# Patient Record
Sex: Female | Born: 1960 | Race: Black or African American | Hispanic: No | Marital: Married | Smoking: Never smoker
Health system: Southern US, Community
[De-identification: ages and names within clinical notes are randomized; demographics above are authoritative.]

## PROBLEM LIST (undated history)

## (undated) DIAGNOSIS — T7840XA Allergy, unspecified, initial encounter: Secondary | ICD-10-CM

## (undated) DIAGNOSIS — K219 Gastro-esophageal reflux disease without esophagitis: Secondary | ICD-10-CM

## (undated) DIAGNOSIS — Z8742 Personal history of other diseases of the female genital tract: Secondary | ICD-10-CM

## (undated) DIAGNOSIS — E7800 Pure hypercholesterolemia, unspecified: Secondary | ICD-10-CM

## (undated) DIAGNOSIS — I10 Essential (primary) hypertension: Secondary | ICD-10-CM

## (undated) DIAGNOSIS — J329 Chronic sinusitis, unspecified: Secondary | ICD-10-CM

## (undated) DIAGNOSIS — E119 Type 2 diabetes mellitus without complications: Secondary | ICD-10-CM

## (undated) DIAGNOSIS — I639 Cerebral infarction, unspecified: Secondary | ICD-10-CM

## (undated) DIAGNOSIS — Z8632 Personal history of gestational diabetes: Secondary | ICD-10-CM

## (undated) DIAGNOSIS — U071 COVID-19: Secondary | ICD-10-CM

## (undated) DIAGNOSIS — E876 Hypokalemia: Secondary | ICD-10-CM

## (undated) DIAGNOSIS — R718 Other abnormality of red blood cells: Secondary | ICD-10-CM

## (undated) HISTORY — DX: Personal history of gestational diabetes: Z86.32

## (undated) HISTORY — DX: Allergy, unspecified, initial encounter: T78.40XA

## (undated) HISTORY — DX: Chronic sinusitis, unspecified: J32.9

## (undated) HISTORY — DX: Other abnormality of red blood cells: R71.8

## (undated) HISTORY — DX: Hypokalemia: E87.6

## (undated) HISTORY — DX: COVID-19: U07.1

## (undated) HISTORY — PX: TUBAL LIGATION: SHX77

## (undated) HISTORY — DX: Personal history of other diseases of the female genital tract: Z87.42

---

## 2001-07-31 HISTORY — PX: ABDOMINAL HYSTERECTOMY: SHX81

## 2011-09-08 ENCOUNTER — Ambulatory Visit: Payer: Self-pay | Admitting: Family Medicine

## 2014-01-23 ENCOUNTER — Ambulatory Visit: Payer: Self-pay | Admitting: Gastroenterology

## 2014-02-22 HISTORY — PX: COLONOSCOPY: SHX174

## 2014-02-22 HISTORY — PX: ESOPHAGOGASTRODUODENOSCOPY: SHX1529

## 2015-01-01 ENCOUNTER — Telehealth: Payer: Self-pay

## 2015-01-01 MED ORDER — AMOXICILLIN-POT CLAVULANATE 875-125 MG PO TABS: 1.0000 | ORAL_TABLET | Freq: Two times a day (BID) | ORAL | Status: DC

## 2015-01-01 NOTE — Telephone Encounter (Signed)
She was seen on Tuesday for a sick visit. She now has face pain from under her eyes down to the jaw and green drainage. She wants to know if you would call something in for her?

## 2015-01-01 NOTE — Telephone Encounter (Signed)
Let pt know strep test was negative It sounds like she is developing a sinus infection perhaps Start new antibiotic (already sent); use neti pot or saline nasal spray to rinse out nasal passages Take probiotic daily for 3 weeks Call or come in if needed

## 2015-01-04 ENCOUNTER — Emergency Department (HOSPITAL_COMMUNITY): Payer: 59

## 2015-01-04 ENCOUNTER — Emergency Department (HOSPITAL_COMMUNITY)
Admission: EM | Admit: 2015-01-04 | Discharge: 2015-01-04 | Disposition: A | Discharge status: Home / Self Care | Payer: 59 | Attending: Emergency Medicine | Admitting: Emergency Medicine

## 2015-01-04 ENCOUNTER — Encounter (HOSPITAL_COMMUNITY): Payer: Self-pay | Admitting: *Deleted

## 2015-01-04 DIAGNOSIS — I10 Essential (primary) hypertension: Secondary | ICD-10-CM | POA: Insufficient documentation

## 2015-01-04 DIAGNOSIS — Z792 Long term (current) use of antibiotics: Secondary | ICD-10-CM | POA: Insufficient documentation

## 2015-01-04 DIAGNOSIS — E119 Type 2 diabetes mellitus without complications: Secondary | ICD-10-CM | POA: Insufficient documentation

## 2015-01-04 DIAGNOSIS — Z7982 Long term (current) use of aspirin: Secondary | ICD-10-CM | POA: Insufficient documentation

## 2015-01-04 DIAGNOSIS — Z79899 Other long term (current) drug therapy: Secondary | ICD-10-CM | POA: Insufficient documentation

## 2015-01-04 DIAGNOSIS — R05 Cough: Secondary | ICD-10-CM | POA: Diagnosis present

## 2015-01-04 DIAGNOSIS — K219 Gastro-esophageal reflux disease without esophagitis: Secondary | ICD-10-CM | POA: Insufficient documentation

## 2015-01-04 DIAGNOSIS — E780 Pure hypercholesterolemia: Secondary | ICD-10-CM | POA: Diagnosis not present

## 2015-01-04 DIAGNOSIS — J159 Unspecified bacterial pneumonia: Secondary | ICD-10-CM | POA: Diagnosis not present

## 2015-01-04 DIAGNOSIS — J189 Pneumonia, unspecified organism: Secondary | ICD-10-CM

## 2015-01-04 HISTORY — DX: Pure hypercholesterolemia, unspecified: E78.00

## 2015-01-04 HISTORY — DX: Gastro-esophageal reflux disease without esophagitis: K21.9

## 2015-01-04 HISTORY — DX: Type 2 diabetes mellitus without complications: E11.9

## 2015-01-04 HISTORY — DX: Essential (primary) hypertension: I10

## 2015-01-04 MED ORDER — IPRATROPIUM-ALBUTEROL 0.5-2.5 (3) MG/3ML IN SOLN
3.0000 mL | Freq: Once | RESPIRATORY_TRACT | Status: AC
  Administered 2015-01-04: 3 mL via RESPIRATORY_TRACT
  Filled 2015-01-04: qty 3

## 2015-01-04 NOTE — ED Notes (Signed)
Pt c/o cough that started two weeks ago that is non productive, has been seen by pcp, was originally placed on inhaler and cough medication, was not getting better, contacted pcp office again Friday and was placed on amoxicillan-clavulanate 875-125 mg bid with no improvement in symptoms, started running fever today,

## 2015-01-04 NOTE — Telephone Encounter (Signed)
Pt. Notified, she picked up the antibiotic on Friday but she stated feeling worse yesterday and she went to the ER last night and was diagnosed with Pneumonia.

## 2015-01-04 NOTE — ED Provider Notes (Signed)
CSN: [id number]     Arrival date & time 01/04/15  0027 History  This chart was scribed for Elizabeth Rhine, MD by Lyndel Safe, ED Scribe. This patient was seen in room APA03/APA03 and the patient's care was started 1:02 AM.  Chief Complaint  Patient presents with  . Cough   Patient is a 54 y.o. female presenting with cough. The history is provided by the patient and the spouse. No language interpreter was used.  Cough Cough characteristics:  Non-productive Severity:  Moderate Onset quality:  Sudden Duration:  2 weeks Timing:  Constant Progression:  Worsening Chronicity:  New Smoker: no   Relieved by:  Nothing Worsened by:  Deep breathing Ineffective treatments:  Beta-agonist inhaler and cough suppressants Associated symptoms: fever, headaches and shortness of breath   Associated symptoms: no chest pain    HPI Comments: Elizabeth Scott is a 54 y.o. female, HTN, GERD, HLD, and DM,  who presents to the Emergency Department complaining of a constant, progressively worsening cough onset 2 weeks ago with associated subjective fever, headache, SOB, and decreased appetite. She reports she was seen by her doctor 5 days ago and was prescribed albuterol and cough medication which provided no relief. Her doctor then called in amoxicillin which she was compliant with but the symptoms have worsened. She denies vomiting, diarrhea, abdominal pain, LE swelling, or a history of smoking.  Past Medical History  Diagnosis Date  . Hypertension   . Diabetes mellitus without complication   . High cholesterol   . GERD (gastroesophageal reflux disease)    Past Surgical History  Procedure Laterality Date  . Abdominal hysterectomy    . Cesarean section     No family history on file. History  Substance Use Topics  . Smoking status: Never Smoker   . Smokeless tobacco: Not on file  . Alcohol Use: No   OB History    No data available     Review of Systems  Constitutional: Positive for fever  and appetite change.  Respiratory: Positive for cough and shortness of breath.   Cardiovascular: Negative for chest pain and leg swelling.  Gastrointestinal: Negative for vomiting, abdominal pain and diarrhea.  Neurological: Positive for headaches.  All other systems reviewed and are negative.  Allergies  Metformin and related  Home Medications   Prior to Admission medications   Medication Sig Start Date End Date Taking? Authorizing Provider  albuterol (PROVENTIL HFA;VENTOLIN HFA) 108 (90 BASE) MCG/ACT inhaler Inhale 2 puffs into the lungs every 2 (two) hours as needed for wheezing or shortness of breath.   Yes Historical Provider, MD  AMLODIPINE BESYLATE PO Take 5 mg by mouth daily.   Yes Historical Provider, MD  aspirin 81 MG tablet Take 81 mg by mouth daily.   Yes Historical Provider, MD  atorvastatin (LIPITOR) 40 MG tablet Take 40 mg by mouth daily.   Yes Historical Provider, MD  glipiZIDE (GLUCOTROL XL) 10 MG 24 hr tablet Take 10 mg by mouth daily with breakfast.   Yes Historical Provider, MD  HYDROCODONE-CHLORPHENIRAMINE PO Take 5 mLs by mouth as needed.   Yes Historical Provider, MD  linagliptin (TRADJENTA) 5 MG TABS tablet Take 5 mg by mouth daily.   Yes Historical Provider, MD  lisinopril-hydrochlorothiazide (PRINZIDE,ZESTORETIC) 20-25 MG per tablet Take 1 tablet by mouth daily.   Yes Historical Provider, MD  pantoprazole (PROTONIX) 40 MG tablet Take 40 mg by mouth daily.   Yes Historical Provider, MD  PRESCRIPTION MEDICATION    Yes Historical  Provider, MD  amoxicillin-clavulanate (AUGMENTIN) 875-125 MG per tablet Take 1 tablet by mouth 2 (two) times daily. 01/01/15   Kerman PasseyMelinda P Lada, MD   BP 144/71 mmHg  Pulse 112  Temp(Src) 98.8 F (37.1 C) (Oral)  Resp 20  Ht 5\' 3"  (1.6 m)  Wt 208 lb (94.348 kg)  BMI 36.85 kg/m2  SpO2 94%  Physical Exam CONSTITUTIONAL: Well developed/well nourished HEAD: Normocephalic/atraumatic EYES: EOMI/PERRL ENMT: Mucous membranes moist NECK:  supple no meningeal signs SPINE/BACK:entire spine nontender CV: S1/S2 noted, no murmurs/rubs/gallops noted LUNGS:decreased breath sounds bilaterally  ABDOMEN: soft, nontender, no rebound or guarding, bowel sounds noted throughout abdomen GU:no cva tenderness NEURO: Pt is awake/alert/appropriate, moves all extremitiesx4.  No facial droop.   EXTREMITIES: pulses normal/equal, full ROM SKIN: warm, color normal PSYCH: no abnormalities of mood noted, alert and oriented to situation  ED Course  Procedures  DIAGNOSTIC STUDIES: Oxygen Saturation is 94% on RA, adequate by my interpretation.    COORDINATION OF CARE: 1:03 AM Discussed treatment plan which includes breathing treatment and Xray with pt. Pt acknowledges and agrees to plan.   Medications  ipratropium-albuterol (DUONEB) 0.5-2.5 (3) MG/3ML nebulizer solution 3 mL (3 mLs Nebulization Given 01/04/15 0101)    Imaging Review Dg Chest 2 View  01/04/2015   CLINICAL DATA:  Nonproductive cough, chest soreness and shortness of breath for 2 weeks. Fever.  EXAM: CHEST  2 VIEW  COMPARISON:  None.  FINDINGS: Minimal left lower lobe consolidation abutting the diaphragm. Right lung is clear. Cardiomediastinal contours are normal. No pulmonary edema, pleural effusion or pneumothorax. No acute osseous abnormalities.  IMPRESSION: Minimal left lower lobe consolidation concerning for pneumonia. Followup PA and lateral chest X-ray is recommended in 3-4 weeks following trial of antibiotic therapy to ensure resolution and exclude underlying malignancy.   Electronically Signed   By: Rubye OaksMelanie  Ehinger M.D.   On: 01/04/2015 02:44   Pneumonia noted Pt is well appearing, no distress and no hypoxia She just started augmentin 2 days ago - recommend continuing for now, but if not improving within 48 hours may need to call PCP to change to zpack Stable for d/c home Advised need for repeat CXR in 4 weeks   MDM   Final diagnoses:  CAP (community acquired pneumonia)     Nursing notes including past medical history and social history reviewed and considered in documentation   Nursing notes including past medical history and social history reviewed and considered in documentation xrays/imaging reviewed by myself and considered during evaluation   I personally performed the services described in this documentation, which was scribed in my presence. The recorded information has been reviewed and is accurate.      Elizabeth Rhineonald Alissia Lory, MD 01/04/15 [phone]

## 2015-01-06 ENCOUNTER — Telehealth: Payer: Self-pay | Admitting: Family Medicine

## 2015-01-06 DIAGNOSIS — J189 Pneumonia, unspecified organism: Secondary | ICD-10-CM

## 2015-01-06 NOTE — Telephone Encounter (Signed)
Pt. Notified.

## 2015-01-06 NOTE — Telephone Encounter (Signed)
She saw you last week for a sick visit. She then went to the ER on Monday and was diagnosed with Pneumonia. They gave her a work note for June 6th, she has still been out. She wants to know if you can write a note for her being out of work on the 7th and the 8th or does she need to get that from the ER?

## 2015-01-06 NOTE — Telephone Encounter (Signed)
Pt called and needs a call back, has a question about a doctors note. Please call pt @ [phone]. Thanks.

## 2015-01-06 NOTE — Telephone Encounter (Signed)
We certainly hope she is feeling better Please have her get a CXR in 3-4 weeks (I entered order, for Elizabeth Scott) Since we've not seen her, I would ask if she could contact the ER for the work note extension Come in for appointment if getting worse or not resolving Thank you

## 2015-01-07 ENCOUNTER — Telehealth: Payer: Self-pay | Admitting: Family Medicine

## 2015-01-07 NOTE — Telephone Encounter (Signed)
Pt has questions about FMLA papers.  Please call her.

## 2015-01-07 NOTE — Telephone Encounter (Signed)
Patient scheduled appt with Dr. Sherie Don tomorrow morning. 01/08/15 for paperwork

## 2015-01-08 ENCOUNTER — Other Ambulatory Visit: Payer: Self-pay

## 2015-01-08 ENCOUNTER — Ambulatory Visit (INDEPENDENT_AMBULATORY_CARE_PROVIDER_SITE_OTHER): Payer: 59 | Admitting: Family Medicine

## 2015-01-08 ENCOUNTER — Encounter: Payer: Self-pay | Admitting: Family Medicine

## 2015-01-08 VITALS — BP 128/83 | HR 90 | Temp 97.7°F | Wt 206.0 lb

## 2015-01-08 DIAGNOSIS — J189 Pneumonia, unspecified organism: Secondary | ICD-10-CM

## 2015-01-08 DIAGNOSIS — K219 Gastro-esophageal reflux disease without esophagitis: Secondary | ICD-10-CM

## 2015-01-08 MED ORDER — LISINOPRIL-HYDROCHLOROTHIAZIDE 20-25 MG PO TABS: 1.0000 | ORAL_TABLET | Freq: Every day | ORAL | Status: DC

## 2015-01-08 NOTE — Patient Instructions (Addendum)
Out of work all this week and Monday of next week Return to work on Tuesday for just 4 hours a day for the rest of that week Return to full time the following Monday Call if your symptoms worsen or we need to change that Try raising the head of the bed Limit the pantoprazole and skip two days a week, and okay to use Tums for symptom control if needed Avoid any triggers for heartburn Try to get some dietary magnesium High levels of magnesium are found in green leafy vegetables, peas, beans, and nuts among other things. It can also be given through medications by mouth. OTC supplement is 250 or 400 mg if you feel palpitations   Gastroesophageal Reflux Disease, Adult Gastroesophageal reflux disease (GERD) happens when acid from your stomach flows up into the esophagus. When acid comes in contact with the esophagus, the acid causes soreness (inflammation) in the esophagus. Over time, GERD may create small holes (ulcers) in the lining of the esophagus. CAUSES   Increased body weight. This puts pressure on the stomach, making acid rise from the stomach into the esophagus.  Smoking. This increases acid production in the stomach.  Drinking alcohol. This causes decreased pressure in the lower esophageal sphincter (valve or ring of muscle between the esophagus and stomach), allowing acid from the stomach into the esophagus.  Late evening meals and a full stomach. This increases pressure and acid production in the stomach.  A malformed lower esophageal sphincter. Sometimes, no cause is found. SYMPTOMS   Burning pain in the lower part of the mid-chest behind the breastbone and in the mid-stomach area. This may occur twice a week or more often.  Trouble swallowing.  Sore throat.  Dry cough.  Asthma-like symptoms including chest tightness, shortness of breath, or wheezing. DIAGNOSIS  Your caregiver may be able to diagnose GERD based on your symptoms. In some cases, X-rays and other tests may  be done to check for complications or to check the condition of your stomach and esophagus. TREATMENT  Your caregiver may recommend over-the-counter or prescription medicines to help decrease acid production. Ask your caregiver before starting or adding any new medicines.  HOME CARE INSTRUCTIONS   Change the factors that you can control. Ask your caregiver for guidance concerning weight loss, quitting smoking, and alcohol consumption.  Avoid foods and drinks that make your symptoms worse, such as:  Caffeine or alcoholic drinks.  Chocolate.  Peppermint or mint flavorings.  Garlic and onions.  Spicy foods.  Citrus fruits, such as oranges, lemons, or limes.  Tomato-based foods such as sauce, chili, salsa, and pizza.  Fried and fatty foods.  Avoid lying down for the 3 hours prior to your bedtime or prior to taking a nap.  Eat small, frequent meals instead of large meals.  Wear loose-fitting clothing. Do not wear anything tight around your waist that causes pressure on your stomach.  Raise the head of your bed 6 to 8 inches with wood blocks to help you sleep. Extra pillows will not help.  Only take over-the-counter or prescription medicines for pain, discomfort, or fever as directed by your caregiver.  Do not take aspirin, ibuprofen, or other nonsteroidal anti-inflammatory drugs (NSAIDs). SEEK IMMEDIATE MEDICAL CARE IF:   You have pain in your arms, neck, jaw, teeth, or back.  Your pain increases or changes in intensity or duration.  You develop nausea, vomiting, or sweating (diaphoresis).  You develop shortness of breath, or you faint.  Your vomit is green,  yellow, black, or looks like coffee grounds or blood.  Your stool is red, bloody, or black. These symptoms could be signs of other problems, such as heart disease, gastric bleeding, or esophageal bleeding. MAKE SURE YOU:   Understand these instructions.  Will watch your condition.  Will get help right away if  you are not doing well or get worse. Document Released: 04/26/2005 Document Revised: 10/09/2011 Document Reviewed: 02/03/2011 [facility] Patient Information 2015 [place], [place]. This information is not intended to replace advice given to you by your health care provider. Make sure you discuss any questions you have with your health care provider.

## 2015-01-08 NOTE — Assessment & Plan Note (Signed)
Explained risk of continued use of PPIs (anemia, pneumonia, osteoporosis, etc.); she will try to skip a few doses each week, avoid triggers, use Tums, etc.; I encouraged her to try to elevated the HOB; no red flags

## 2015-01-08 NOTE — Progress Notes (Signed)
   BP 128/83 mmHg  Pulse 90  Temp(Src) 97.7 F (36.5 C)  Wt 206 lb (93.441 kg)  SpO2 96%   Subjective:    Patient ID: Elizabeth Scott, female    DOB: 1961/04/11, 54 y.o.   MRN: [id number]  HPI: Elizabeth Scott is a 54 y.o. 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 Interim medical history since our last visit reviewed. Allergies and medications reviewed and updated.  Review of Systems  Constitutional: Negative for fever and chills.  HENT: Positive for congestion, postnasal drip, sinus pressure and sneezing.   Respiratory: Positive for cough, shortness of breath and wheezing (not bad, husband might say yes; using SABA which helps).   Cardiovascular: Positive for palpitations.  Gastrointestinal: Negative for abdominal pain.   Per HPI unless specifically indicated above     Objective:    BP 128/83 mmHg  Pulse 90  Temp(Src) 97.7 F (36.5 C)  Wt 206 lb (93.441 kg)  SpO2 96%  Wt Readings from Last 3 Encounters:  01/08/15 206 lb (93.441 kg)  12/29/14 211 lb (95.709 kg)  01/04/15 208 lb (94.348 kg)    Physical Exam  Constitutional: She appears well-developed and well-nourished. No distress.  Eyes: Right eye exhibits no discharge. Left eye exhibits no discharge. No scleral icterus.  Neck: Neck supple.  Cardiovascular: Normal rate, regular rhythm and  normal heart sounds.   Pulmonary/Chest: Effort normal. No respiratory distress. She has no wheezes.  Faint rhonchi in the left lower lobe; occasional cough  Abdominal: Soft. She exhibits no distension.  Skin: Skin is warm and dry. No rash noted. She is not diaphoretic.  Psychiatric: She has a normal mood and affect. Her behavior is normal.    No results found for this or any previous visit.    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

## 2015-01-22 ENCOUNTER — Ambulatory Visit
Admission: RE | Admit: 2015-01-22 | Discharge: 2015-01-22 | Disposition: A | Discharge status: Home / Self Care | Payer: 59 | Source: Ambulatory Visit | Attending: Family Medicine | Admitting: Family Medicine

## 2015-01-22 ENCOUNTER — Telehealth: Payer: Self-pay | Admitting: Family Medicine

## 2015-01-22 DIAGNOSIS — J189 Pneumonia, unspecified organism: Secondary | ICD-10-CM | POA: Diagnosis present

## 2015-01-22 DIAGNOSIS — Z09 Encounter for follow-up examination after completed treatment for conditions other than malignant neoplasm: Secondary | ICD-10-CM | POA: Insufficient documentation

## 2015-01-22 NOTE — Telephone Encounter (Signed)
Pneumonia

## 2015-04-27 ENCOUNTER — Telehealth: Payer: Self-pay | Admitting: Family Medicine

## 2015-04-27 DIAGNOSIS — R718 Other abnormality of red blood cells: Secondary | ICD-10-CM

## 2015-04-27 DIAGNOSIS — I10 Essential (primary) hypertension: Secondary | ICD-10-CM | POA: Insufficient documentation

## 2015-04-27 DIAGNOSIS — E669 Obesity, unspecified: Secondary | ICD-10-CM

## 2015-04-27 DIAGNOSIS — E7800 Pure hypercholesterolemia, unspecified: Secondary | ICD-10-CM

## 2015-04-27 DIAGNOSIS — E785 Hyperlipidemia, unspecified: Secondary | ICD-10-CM | POA: Insufficient documentation

## 2015-04-27 DIAGNOSIS — E119 Type 2 diabetes mellitus without complications: Secondary | ICD-10-CM | POA: Insufficient documentation

## 2015-04-27 DIAGNOSIS — E1165 Type 2 diabetes mellitus with hyperglycemia: Secondary | ICD-10-CM

## 2015-04-27 DIAGNOSIS — IMO0002 Reserved for concepts with insufficient information to code with codable children: Secondary | ICD-10-CM

## 2015-04-27 NOTE — Telephone Encounter (Signed)
Please let Adessa Primiano Durden know that I'd like to see patient for an appointment here in the office for:  diabetes Please schedule a visit with me  in the next: month Fasting?  Yes please We received a note from her insurance company and we'll want to talk about her acid reflux medicine too Thank you, Dr. Sherie Don

## 2015-04-30 NOTE — Telephone Encounter (Signed)
After 2x calling patient answered the phone and she stated that she would have to look at her schedule and see when she can come in. She will give [place] a call back later.

## 2015-06-03 ENCOUNTER — Telehealth: Payer: Self-pay | Admitting: Family Medicine

## 2015-06-03 NOTE — Telephone Encounter (Signed)
Called patients numbers and can not leave vm, will try again later.

## 2015-06-03 NOTE — Telephone Encounter (Signed)
Routing to provider  

## 2015-06-03 NOTE — Telephone Encounter (Signed)
Cr and K+ from Feb 2016 reviewed; she is overdue for visit and labs ------------------ Elizabeth Scott Please let Scherrie Gerlacheresa Elaine Mccutcheon know that I'd like to see patient for an appointment here in the office for:  Diabetes See previous note from September Please schedule a visit with me  in the next: few weeks Fasting?  Yes please Thank you, Dr. Sherie DonLada Sending Rx as requested

## 2015-06-05 ENCOUNTER — Other Ambulatory Visit: Payer: Self-pay | Admitting: Family Medicine

## 2015-07-02 ENCOUNTER — Other Ambulatory Visit: Payer: Self-pay | Admitting: Family Medicine

## 2015-07-02 NOTE — Telephone Encounter (Signed)
Please see notes from Sept and Nov Patient needs an appointment and fasting labs please Please let Scherrie Gerlacheresa Elaine Mendonsa know that I'd like to see patient for an appointment here in the office for:  Diabetes, HTN, cholesterol Please schedule a visit with me  in the next: three weeks Fasting?  Yes THIRTY minutes Thank you, Dr. Sherie DonLada Rx sent

## 2015-07-02 NOTE — Telephone Encounter (Signed)
Called left vm to call us and schedule f/u appt for diabetes, cholesterol with fasting labs.

## 2015-07-02 NOTE — Telephone Encounter (Signed)
Your patient.  Thanks 

## 2015-07-09 ENCOUNTER — Encounter: Payer: Self-pay | Admitting: Family Medicine

## 2015-07-09 NOTE — Telephone Encounter (Signed)
Send letter out 07/09/15

## 2015-07-24 ENCOUNTER — Other Ambulatory Visit: Payer: Self-pay | Admitting: Family Medicine

## 2015-07-24 NOTE — Telephone Encounter (Signed)
See notes from Sept, Nov, and earlier this month; letter sent out; note to pharmacist, we are trying to reach patient to bring her in for an appt; asked them to have patient call office; I allowed 7 days of medicine

## 2015-08-10 ENCOUNTER — Other Ambulatory Visit: Payer: Self-pay | Admitting: Family Medicine

## 2015-08-10 NOTE — Telephone Encounter (Signed)
Routing to provider  

## 2015-08-10 NOTE — Telephone Encounter (Signed)
See notes from Sept and Nov, [facility] to come [facility]

## 2015-08-10 NOTE — Telephone Encounter (Signed)
Pt called in to schedule cpe for 08/30/15 but will need refill on lisinopril sent to [facility].

## 2015-08-11 MED ORDER — LISINOPRIL-HYDROCHLOROTHIAZIDE 20-25 MG PO TABS: 1.0000 | ORAL_TABLET | Freq: Every day | ORAL | Status: DC

## 2015-08-11 NOTE — Telephone Encounter (Signed)
She needs a separate visit please for her diabetes and multiple medical problems; we don't typically address chronic problems at the time of preventive care; she can switch that appt to diabetes, HTN, etc.or make a second appt please; she hasn't had diabetes labs done since Feb 2016

## 2015-08-30 ENCOUNTER — Ambulatory Visit (INDEPENDENT_AMBULATORY_CARE_PROVIDER_SITE_OTHER): Payer: 59 | Admitting: Family Medicine

## 2015-08-30 VITALS — BP 118/78 | HR 76 | Temp 97.8°F | Ht 63.25 in | Wt 210.0 lb

## 2015-08-30 DIAGNOSIS — E669 Obesity, unspecified: Secondary | ICD-10-CM

## 2015-08-30 DIAGNOSIS — I10 Essential (primary) hypertension: Secondary | ICD-10-CM | POA: Diagnosis not present

## 2015-08-30 DIAGNOSIS — Z1239 Encounter for other screening for malignant neoplasm of breast: Secondary | ICD-10-CM

## 2015-08-30 DIAGNOSIS — Z0000 Encounter for general adult medical examination without abnormal findings: Secondary | ICD-10-CM | POA: Diagnosis not present

## 2015-08-30 DIAGNOSIS — E7800 Pure hypercholesterolemia, unspecified: Secondary | ICD-10-CM

## 2015-08-30 DIAGNOSIS — Z91018 Allergy to other foods: Secondary | ICD-10-CM | POA: Diagnosis not present

## 2015-08-30 DIAGNOSIS — R718 Other abnormality of red blood cells: Secondary | ICD-10-CM | POA: Diagnosis not present

## 2015-08-30 DIAGNOSIS — E1165 Type 2 diabetes mellitus with hyperglycemia: Secondary | ICD-10-CM

## 2015-08-30 MED ORDER — LISINOPRIL-HYDROCHLOROTHIAZIDE 20-25 MG PO TABS: 1.0000 | ORAL_TABLET | Freq: Every day | ORAL | Status: DC

## 2015-08-30 MED ORDER — GLIPIZIDE ER 10 MG PO TB24: 10.0000 mg | ORAL_TABLET | Freq: Every morning | ORAL | Status: DC

## 2015-08-30 MED ORDER — EPINEPHRINE 0.3 MG/0.3ML IJ SOAJ
0.3000 mg | Freq: Once | INTRAMUSCULAR | Status: DC
Start: 2015-08-30 — End: 2019-12-30

## 2015-08-30 MED ORDER — AMLODIPINE BESYLATE 5 MG PO TABS: 5.0000 mg | ORAL_TABLET | Freq: Every day | ORAL | Status: DC

## 2015-08-30 MED ORDER — LINAGLIPTIN 5 MG PO TABS: 5.0000 mg | ORAL_TABLET | Freq: Every day | ORAL | Status: DC

## 2015-08-30 MED ORDER — ATORVASTATIN CALCIUM 40 MG PO TABS: 40.0000 mg | ORAL_TABLET | Freq: Every day | ORAL | Status: DC

## 2015-08-30 NOTE — Assessment & Plan Note (Signed)
Encouragement given for weight loss; see AVS 

## 2015-08-30 NOTE — Assessment & Plan Note (Addendum)
Check fasting lipids today; continue statin; limit saturated fats; work on weight loss

## 2015-08-30 NOTE — Assessment & Plan Note (Addendum)
Epi-pen to use for life-threatening emergency only; pay attention to labels; carrying benadryl

## 2015-08-30 NOTE — Assessment & Plan Note (Signed)
USPSTF grade A and B recommendations reviewed with patient; age-appropriate recommendations, preventive care, screening tests, etc discussed and encouraged; healthy living encouraged; see AVS for patient education given to patient  

## 2015-08-30 NOTE — Progress Notes (Signed)
Patient ID: Elizabeth Scott, female   DOB: [birth year], 55 y.o.   MRN: [id number]   Subjective:   Elizabeth Scott is a 55 y.o. female here for a complete physical exam  Interim issues since last visit: none  USPSTF grade A and B recommendations Alcohol: not excess Depression:  Depression screen [facility] 2/9 08/30/2015  Decreased Interest 0  Down, Depressed, Hopeless 0  PHQ - 2 Score 0   Hypertension: controlled Obesity: not trying to lose weight; she will try to lose weight Tobacco use: nonsmoker HIV, hep B, hep C: politely declined STD testing and prevention (chl/gon/syphilis): asx, declined Lipids: check, fasting Glucose: fasting Colorectal cancer: UTD 2015; no fam hx, next due 2020, had 2 polyps Breast cancer: no lumps, no fam hx BRCA gene screening:no ovarian cancer Intimate partner violence:no abuse Cervical cancer screening: s/p hysterectomy, noncancerous reasons Lung cancer: n/a Osteoporosis: n/a Fall prevention/vitamin D: not taking, suggested 1000 iu  AAA: n/a Aspirin: n/a, taking one daily, coated to protect stomach Diet: typical American, no sausage, no eggs, usually skips breakfast Exercise: no regular exercise, walks at work, on feet more than 4 hours a day Skin cancer: no worrisome moles  -----------------  Diabetes; no blurred vision, no dry mouth; last eye exam July or August 2016; taking aspirin No numbness or sore of the feet; cannot take metformin, turned mouth white and couldn't taste anything Flu shot UTD  High cholesterol; not much sausage, not much cheese, no eggs; on statin; occasional muscle aches, nothing bad enough to stop medicine  High blood pressure; controlled today on medicine  Obesity; she and her sister are going to try to lose weight together  GERD, not an issue  Past Medical History  Diagnosis Date  . Hypertension   . Diabetes mellitus without complication ([place])   . High cholesterol   . GERD (gastroesophageal reflux  disease)   . Chronic sinusitis   . History of gestational diabetes   . Allergy   . Hypopotassemia   . Microcytosis   . History of abnormal cervical Pap smear     Prior to Hysterectomy, normal paps for 2-3 years after Hysterectomy   Past Surgical History  Procedure Laterality Date  . Cesarean section    . Tubal ligation    . Abdominal hysterectomy  2003    still has ovaries-for menorrhagia  . Colonoscopy  02/22/14    repeat 5 years  . Esophagogastroduodenoscopy  02/22/14   Family History  Problem Relation Age of Onset  . Diabetes Mother   . Heart disease Mother   . Hypertension Mother   . Stroke Mother   . Diabetes Father   . Hypertension Father   . Asthma Sister   . Diabetes Sister   . Hypertension Sister   . Lung disease Sister   . Hypertension Brother   . Heart disease Brother   . Stroke Brother   . Asthma Sister   . Diabetes Sister   . Hypertension Sister   . Cancer Neg Hx   . COPD Neg Hx    Social History  Substance Use Topics  . Smoking status: Never Smoker   . Smokeless tobacco: Never Used  . Alcohol Use: No   Review of Systems  Constitutional: Negative for diaphoresis and unexpected weight change.  HENT: Negative for nosebleeds.   Eyes: Negative for visual disturbance.  Respiratory: Negative for shortness of breath. Wheezing: just when catching a cold.   Cardiovascular: Negative for chest pain.  Gastrointestinal: Negative for  blood in stool.  Endocrine: Positive for cold intolerance. Negative for polydipsia and polyuria.  Genitourinary: Negative for hematuria.  Musculoskeletal: Negative for joint swelling and arthralgias.  Skin:       No worrisome moles  Allergic/Immunologic: Positive for food allergies (walnuts, no anaphylaxis, tongue itches, uses benadryl).  Neurological: Negative for tremors.  Hematological: Bruises/bleeds easily (easy bruising, nothing new).  Psychiatric/Behavioral: Negative for dysphoric mood.   Objective:   Filed Vitals:    08/30/15 1007  BP: 118/78  Pulse: 76  Temp: 97.8 F (36.6 C)  Height: 5' 3.25" (1.607 m)  Weight: 210 lb (95.255 kg)  SpO2: 97%   Body mass index is 36.89 kg/(m^2). Wt Readings from Last 3 Encounters:  08/30/15 210 lb (95.255 kg)  01/08/15 206 lb (93.441 kg)  12/29/14 211 lb (95.709 kg)   Physical Exam  Constitutional: She appears well-developed and well-nourished.  obese  HENT:  Head: Normocephalic and atraumatic.  Right Ear: Hearing, tympanic membrane, external ear and ear canal normal.  Left Ear: Hearing, tympanic membrane, external ear and ear canal normal.  Eyes: Conjunctivae and EOM are normal. Right eye exhibits no hordeolum. Left eye exhibits no hordeolum. No scleral icterus.  Neck: Carotid bruit is not present. No thyromegaly present.  Cardiovascular: Normal rate, regular rhythm, S1 normal, S2 normal and normal heart sounds.   No extrasystoles are present.  Pulmonary/Chest: Effort normal and breath sounds normal. No respiratory distress. Right breast exhibits no inverted nipple, no mass, no nipple discharge, no skin change and no tenderness. Left breast exhibits no inverted nipple, no mass, no nipple discharge, no skin change and no tenderness. Breasts are symmetrical.  Abdominal: Soft. Normal appearance and bowel sounds are normal. She exhibits no distension, no abdominal bruit, no pulsatile midline mass and no mass. There is no hepatosplenomegaly. There is no tenderness. No hernia.  Musculoskeletal: Normal range of motion. She exhibits no edema.  Lymphadenopathy:       Head (right side): No submandibular adenopathy present.       Head (left side): No submandibular adenopathy present.    She has no cervical adenopathy.    She has no axillary adenopathy.  Neurological: She is alert. She displays no tremor. No cranial nerve deficit. She exhibits normal muscle tone. Gait normal.  Reflex Scores:      Patellar reflexes are 2+ on the right side and 2+ on the left side. Skin:  Skin is warm and dry. No bruising and no ecchymosis noted. No cyanosis. No pallor.  Psychiatric: Her speech is normal and behavior is normal. Thought content normal. Her mood appears not anxious. She does not exhibit a depressed mood.   Diabetic Foot Form - Detailed   Diabetic Foot Exam - detailed  Diabetic Foot exam was performed with the following findings:  Yes 08/30/2015 10:57 AM  Visual Foot Exam completed.:  Yes  Are the shoes appropriate in style and fit?:  Yes  Are the toenails long?:  No  Are the toenails thick?:  No  Is there a claw toe deformity?:  No  Is there limited skin dorsiflexion?:  No  Are the toenails ingrown?:  No  Normal Range of Motion:  Yes    Pulse Foot Exam completed.:  Yes  Right Dorsalis Pedis:  Present Left Dorsalis Pedis:  Present  Sensory Foot Exam Completed.:  Yes  Swelling:  No  Semmes-Weinstein Monofilament Test  R Site 1-Great Toe:  Pos L Site 1-Great Toe:  Pos  R Site 4:  Pos L Site 4:  Pos  R Site 5:  Pos L Site 5:  Pos        Assessment/Plan:   Problem List Items Addressed This Visit      Cardiovascular and Mediastinum   Essential hypertension, benign    Well-controlled today; DASH guidelines encoruaged, see AVS; continue meds; work on weight loss      Relevant Medications   EPINEPHrine (EPIPEN 2-PAK) 0.3 mg/0.3 mL IJ SOAJ injection   lisinopril-hydrochlorothiazide (PRINZIDE,ZESTORETIC) 20-25 MG tablet   atorvastatin (LIPITOR) 40 MG tablet   amLODipine (NORVASC) 5 MG tablet     Endocrine   Type 2 diabetes mellitus, uncontrolled (HCC)    Check A1c and urine microalbumin today; foot exam by MD today; instructed pt to check fee daily; work on weight loss; continue meds      Relevant Medications   lisinopril-hydrochlorothiazide (PRINZIDE,ZESTORETIC) 20-25 MG tablet   linagliptin (TRADJENTA) 5 MG TABS tablet   glipiZIDE (GLUCOTROL XL) 10 MG 24 hr tablet   atorvastatin (LIPITOR) 40 MG tablet   Other Relevant Orders   Hgb A1c w/o eAG    Microalbumin / creatinine urine ratio     Other   Hypercholesterolemia    Check fasting lipids today; continue statin; limit saturated fats; work on weight loss      Relevant Medications   EPINEPHrine (EPIPEN 2-PAK) 0.3 mg/0.3 mL IJ SOAJ injection   lisinopril-hydrochlorothiazide (PRINZIDE,ZESTORETIC) 20-25 MG tablet   atorvastatin (LIPITOR) 40 MG tablet   amLODipine (NORVASC) 5 MG tablet   Other Relevant Orders   Lipid Panel w/o Chol/HDL Ratio   Microcytosis    Check CBC and ferritin; 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; will check hemoglobin electrophoresis      Relevant Orders   CBC with Differential/Platelet   Ferritin   Hemoglobinopathy evaluation   Obesity    Encouragement given for weight loss; see AVS      Relevant Medications   linagliptin (TRADJENTA) 5 MG TABS tablet   glipiZIDE (GLUCOTROL XL) 10 MG 24 hr tablet   Walnut allergy    Epi-pen to use for life-threatening emergency only; pay attention to labels; carrying benadryl      Preventative health care - Primary    USPSTF grade A and B recommendations reviewed with patient; age-appropriate recommendations, preventive care, screening tests, etc discussed and encouraged; healthy living encouraged; see AVS for patient education given to patient      Relevant Orders   Comprehensive metabolic panel   TSH    Other Visit Diagnoses    Breast cancer screening        mammogram ordered, pt will call to schedule appt; CBE done today; encouraged SBE    Relevant Orders    MM DIGITAL SCREENING BILATERAL        Meds ordered this encounter  Medications  . EPINEPHrine (EPIPEN 2-PAK) 0.3 mg/0.3 mL IJ SOAJ injection    Sig: Inject 0.3 mLs (0.3 mg total) into the muscle once. For life-threatening emergency only    Dispense:  1 Device    Refill:  1  . lisinopril-hydrochlorothiazide (PRINZIDE,ZESTORETIC) 20-25 MG tablet    Sig: Take 1 tablet by mouth daily.    Dispense:  90 tablet    Refill:  1  . linagliptin  (TRADJENTA) 5 MG TABS tablet    Sig: Take 1 tablet (5 mg total) by mouth daily.    Dispense:  90 tablet    Refill:  1  . glipiZIDE (GLUCOTROL XL) 10 MG 24  hr tablet    Sig: Take 1 tablet (10 mg total) by mouth every morning.    Dispense:  90 tablet    Refill:  1  . atorvastatin (LIPITOR) 40 MG tablet    Sig: Take 1 tablet (40 mg total) by mouth daily.    Dispense:  90 tablet    Refill:  1  . amLODipine (NORVASC) 5 MG tablet    Sig: Take 1 tablet (5 mg total) by mouth daily.    Dispense:  90 tablet    Refill:  1   Orders Placed This Encounter  Procedures  . MM DIGITAL SCREENING BILATERAL    Order Specific Question:  Reason for Exam (SYMPTOM  OR DIAGNOSIS REQUIRED)    Answer:  screening    Order Specific Question:  Is the patient pregnant?    Answer:  No    Order Specific Question:  Preferred imaging location?    Answer:  [facility]  . Hgb A1c w/o eAG  . Lipid Panel w/o Chol/HDL Ratio    Order Specific Question:  Has the patient fasted?    Answer:  Yes  . CBC with Differential/Platelet  . Comprehensive metabolic panel    Order Specific Question:  Has the patient fasted?    Answer:  Yes  . Ferritin  . Hemoglobinopathy evaluation  . Microalbumin / creatinine urine ratio  . TSH   Follow up plan: Return in about 6 months (around 02/27/2016) for thirty minute follow-up with fasting labs; next CPE in 1 year.  An after-visit summary was printed and given to the patient at [facility].  Please see the patient instructions which may contain other information and recommendations beyond what is mentioned above in the assessment and plan.

## 2015-08-30 NOTE — Assessment & Plan Note (Addendum)
Check A1c and urine microalbumin today; foot exam by MD today; instructed pt to check fee daily; work on weight loss; continue meds

## 2015-08-30 NOTE — Patient Instructions (Addendum)
Check out the information at familydoctor.org entitled "What It Takes to Lose Weight" Try to lose between 1-2 pounds per week by taking in fewer calories and burning off more calories You can succeed by limiting portions, limiting foods dense in calories and fat, becoming more active, and drinking 8 glasses of water a day (64 ounces) Don't skip meals, especially breakfast, as skipping meals may alter your metabolism Do not use over-the-counter weight loss pills or gimmicks that claim rapid weight loss A healthy BMI (or body mass index) is between 18.5 and 24.9 You can calculate your ideal BMI at the NIH website JobEconomics.hu Take vitamin D3 1000 iu daily to help with bone strength  Please do schedule an appointment to see your eye doctor, and have your eyes examined every year Check your feet every night and let me know right away of any sores, infections, numbness, etc. Try to limit sweets, white bread, white rice, white potatoes It is okay for you to not check your fingerstick blood sugars (per [employer] of Endocrinology Best Practices)  We'll contact you about your lab results  Please do call to schedule your mammogram; the number to schedule one at either [facility] or [facility] is [phone]  DASH Eating Plan DASH stands for "Dietary Approaches to Stop Hypertension." The DASH eating plan is a healthy eating plan that has been shown to reduce high blood pressure (hypertension). Additional health benefits may include reducing the risk of type 2 diabetes mellitus, heart disease, and stroke. The DASH eating plan may also help with weight loss. WHAT DO I NEED TO KNOW ABOUT THE DASH EATING PLAN? For the DASH eating plan, you will follow these general guidelines:  Choose foods with a percent daily value for sodium of less than 5% (as listed on the food label).  Use salt-free seasonings or herbs  instead of table salt or sea salt.  Check with your health care provider or pharmacist before using salt substitutes.  Eat lower-sodium products, often labeled as "lower sodium" or "no salt added."  Eat fresh foods.  Eat more vegetables, fruits, and low-fat dairy products.  Choose whole grains. Look for the word "whole" as the first word in the ingredient list.  Choose fish and skinless chicken or [place] more often than red meat. Limit fish, poultry, and meat to 6 oz (170 g) each day.  Limit sweets, desserts, sugars, and sugary drinks.  Choose heart-healthy fats.  Limit cheese to 1 oz (28 g) per day.  Eat more home-cooked food and less restaurant, buffet, and fast food.  Limit fried foods.  Cook foods using methods other than frying.  Limit canned vegetables. If you do use them, rinse them well to decrease the sodium.  When eating at a restaurant, ask that your food be prepared with less salt, or no salt if possible. WHAT FOODS CAN I EAT? Seek help from a dietitian for individual calorie needs. Grains Whole grain or whole wheat bread. Brown rice. Whole grain or whole wheat pasta. Quinoa, bulgur, and whole grain cereals. Low-sodium cereals. Corn or whole wheat flour tortillas. Whole grain cornbread. Whole grain crackers. Low-sodium crackers. Vegetables Fresh or frozen vegetables (raw, steamed, roasted, or grilled). Low-sodium or reduced-sodium tomato and vegetable juices. Low-sodium or reduced-sodium tomato sauce and paste. Low-sodium or reduced-sodium canned vegetables.  Fruits All fresh, canned (in natural juice), or frozen fruits. Meat and Other Protein Products Ground beef (85% or leaner), grass-fed beef, or beef trimmed of fat. Skinless  chicken or [place]. Ground chicken or [place]. Pork trimmed of fat. All fish and seafood. Eggs. Dried beans, peas, or lentils. Unsalted nuts and seeds. Unsalted canned beans. Dairy Low-fat dairy products, such as skim or 1% milk, 2% or  reduced-fat cheeses, low-fat ricotta or cottage cheese, or plain low-fat yogurt. Low-sodium or reduced-sodium cheeses. Fats and Oils Tub margarines without trans fats. Light or reduced-fat mayonnaise and salad dressings (reduced sodium). Avocado. Safflower, olive, or canola oils. Natural peanut or almond butter. Other Unsalted popcorn and pretzels. The items listed above may not be a complete list of recommended foods or beverages. Contact your dietitian for more options. WHAT FOODS ARE NOT RECOMMENDED? Grains White bread. White pasta. White rice. Refined cornbread. Bagels and croissants. Crackers that contain trans fat. Vegetables Creamed or fried vegetables. Vegetables in a cheese sauce. Regular canned vegetables. Regular canned tomato sauce and paste. Regular tomato and vegetable juices. Fruits Dried fruits. Canned fruit in light or heavy syrup. Fruit juice. Meat and Other Protein Products Fatty cuts of meat. Ribs, chicken wings, bacon, sausage, bologna, salami, chitterlings, fatback, hot dogs, bratwurst, and packaged luncheon meats. Salted nuts and seeds. Canned beans with salt. Dairy Whole or 2% milk, cream, half-and-half, and cream cheese. Whole-fat or sweetened yogurt. Full-fat cheeses or blue cheese. Nondairy creamers and whipped toppings. Processed cheese, cheese spreads, or cheese curds. Condiments Onion and garlic salt, seasoned salt, table salt, and sea salt. Canned and packaged gravies. Worcestershire sauce. Tartar sauce. Barbecue sauce. Teriyaki sauce. Soy sauce, including reduced sodium. Steak sauce. Fish sauce. Oyster sauce. Cocktail sauce. Horseradish. Ketchup and mustard. Meat flavorings and tenderizers. Bouillon cubes. Hot sauce. Tabasco sauce. Marinades. Taco seasonings. Relishes. Fats and Oils Butter, stick margarine, lard, shortening, ghee, and bacon fat. Coconut, palm kernel, or palm oils. Regular salad dressings. Other Pickles and olives. Salted popcorn and  pretzels. The items listed above may not be a complete list of foods and beverages to avoid. Contact your dietitian for more information. WHERE CAN I FIND MORE INFORMATION? National Heart, Lung, and Blood Institute: CablePromo.it   This information is not intended to replace advice given to you by your health care provider. Make sure you discuss any questions you have with your health care provider.   Document Released: 07/06/2011 Document Revised: 08/07/2014 Document Reviewed: 05/21/2013 Elsevier Interactive Patient Education 2016 [employer]. Diabetes Mellitus and Food It is important for you to manage your blood sugar (glucose) level. Your blood glucose level can be greatly affected by what you eat. Eating healthier foods in the appropriate amounts throughout the day at about the same time each day will help you control your blood glucose level. It can also help slow or prevent worsening of your diabetes mellitus. Healthy eating may even help you improve the level of your blood pressure and reach or maintain a healthy weight.  General recommendations for healthful eating and cooking habits include:  Eating meals and snacks regularly. Avoid going long periods of time without eating to lose weight.  Eating a diet that consists mainly of plant-based foods, such as fruits, vegetables, nuts, legumes, and whole grains.  Using low-heat cooking methods, such as baking, instead of high-heat cooking methods, such as deep frying. Work with your dietitian to make sure you understand how to use the Nutrition Facts information on food labels. HOW CAN FOOD AFFECT ME? Carbohydrates Carbohydrates affect your blood glucose level more than any other type of food. Your dietitian will help you determine how many carbohydrates to eat at each  meal and teach you how to count carbohydrates. Counting carbohydrates is important to keep your blood glucose at a healthy level,  especially if you are using insulin or taking certain medicines for diabetes mellitus. Alcohol Alcohol can cause sudden decreases in blood glucose (hypoglycemia), especially if you use insulin or take certain medicines for diabetes mellitus. Hypoglycemia can be a life-threatening condition. Symptoms of hypoglycemia (sleepiness, dizziness, and disorientation) are similar to symptoms of having too much alcohol.  If your health care provider has given you approval to drink alcohol, do so in moderation and use the following guidelines:  Women should not have more than one drink per day, and men should not have more than two drinks per day. One drink is equal to:  12 oz of beer.  5 oz of wine.  1 oz of hard liquor.  Do not drink on an empty stomach.  Keep yourself hydrated. Have water, diet soda, or unsweetened iced tea.  Regular soda, juice, and other mixers might contain a lot of carbohydrates and should be counted. WHAT FOODS ARE NOT RECOMMENDED? As you make food choices, it is important to remember that all foods are not the same. Some foods have fewer nutrients per serving than other foods, even though they might have the same number of calories or carbohydrates. It is difficult to get your body what it needs when you eat foods with fewer nutrients. Examples of foods that you should avoid that are high in calories and carbohydrates but low in nutrients include:  Trans fats (most processed foods list trans fats on the Nutrition Facts label).  Regular soda.  Juice.  Candy.  Sweets, such as cake, pie, doughnuts, and cookies.  Fried foods. WHAT FOODS CAN I EAT? Eat nutrient-rich foods, which will nourish your body and keep you healthy. The food you should eat also will depend on several factors, including:  The calories you need.  The medicines you take.  Your weight.  Your blood glucose level.  Your blood pressure level.  Your cholesterol level. You should eat a variety of  foods, including:  Protein.  Lean cuts of meat.  Proteins low in saturated fats, such as fish, egg whites, and beans. Avoid processed meats.  Fruits and vegetables.  Fruits and vegetables that may help control blood glucose levels, such as apples, mangoes, and yams.  Dairy products.  Choose fat-free or low-fat dairy products, such as milk, yogurt, and cheese.  Grains, bread, pasta, and rice.  Choose whole grain products, such as multigrain bread, whole oats, and brown rice. These foods may help control blood pressure.  Fats.  Foods containing healthful fats, such as nuts, avocado, olive oil, canola oil, and fish. DOES EVERYONE WITH DIABETES MELLITUS HAVE THE SAME MEAL PLAN? Because every person with diabetes mellitus is different, there is not one meal plan that works for everyone. It is very important that you meet with a dietitian who will help you create a meal plan that is just right for you.   This information is not intended to replace advice given to you by your health care provider. Make sure you discuss any questions you have with your health care provider.   Document Released: 04/13/2005 Document Revised: 08/07/2014 Document Reviewed: 06/13/2013 Elsevier Interactive Patient Education [employer]

## 2015-08-30 NOTE — Assessment & Plan Note (Addendum)
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

## 2015-08-30 NOTE — Assessment & Plan Note (Signed)
Well-controlled today; DASH guidelines encoruaged, see AVS; continue meds; work on weight loss

## 2015-08-31 ENCOUNTER — Other Ambulatory Visit: Payer: Self-pay | Admitting: Family Medicine

## 2015-09-02 ENCOUNTER — Telehealth: Payer: Self-pay | Admitting: Family Medicine

## 2015-09-02 DIAGNOSIS — R7989 Other specified abnormal findings of blood chemistry: Secondary | ICD-10-CM

## 2015-09-02 DIAGNOSIS — D582 Other hemoglobinopathies: Secondary | ICD-10-CM

## 2015-09-02 DIAGNOSIS — R718 Other abnormality of red blood cells: Secondary | ICD-10-CM

## 2015-09-02 LAB — COMPREHENSIVE METABOLIC PANEL
ALT: 21 IU/L (ref 0–32)
AST: 10 IU/L (ref 0–40)
Albumin/Globulin Ratio: 1.6 (ref 1.1–2.5)
Albumin: 3.8 g/dL (ref 3.5–5.5)
Alkaline Phosphatase: 75 IU/L (ref 39–117)
BUN / CREAT RATIO: 16 (ref 9–23)
BUN: 11 mg/dL (ref 6–24)
Bilirubin Total: 0.6 mg/dL (ref 0.0–1.2)
CALCIUM: 9.3 mg/dL (ref 8.7–10.2)
CO2: 23 mmol/L (ref 18–29)
CREATININE: 0.7 mg/dL (ref 0.57–1.00)
Chloride: 98 mmol/L (ref 96–106)
GFR calc Af Amer: 114 mL/min/{1.73_m2} (ref >59)
GFR, EST NON AFRICAN AMERICAN: 99 mL/min/{1.73_m2} (ref >59)
GLOBULIN, TOTAL: 2.4 g/dL (ref 1.5–4.5)
Glucose: 180 mg/dL — ABNORMAL HIGH (ref 65–99)
Potassium: 3.7 mmol/L (ref 3.5–5.2)
Sodium: 136 mmol/L (ref 134–144)
Total Protein: 6.2 g/dL (ref 6.0–8.5)

## 2015-09-02 LAB — CBC WITH DIFFERENTIAL/PLATELET
Basophils Absolute: 0 10*3/uL (ref 0.0–0.2)
Basos: 0 %
EOS (ABSOLUTE): 0.1 10*3/uL (ref 0.0–0.4)
EOS: 1 %
HEMATOCRIT: 36.1 % (ref 34.0–46.6)
Hemoglobin: 12.6 g/dL (ref 11.1–15.9)
IMMATURE GRANULOCYTES: 1 %
Immature Grans (Abs): 0.1 10*3/uL (ref 0.0–0.1)
LYMPHS ABS: 2.1 10*3/uL (ref 0.7–3.1)
Lymphs: 21 %
MCH: 24.8 pg — ABNORMAL LOW (ref 26.6–33.0)
MCHC: 34.9 g/dL (ref 31.5–35.7)
MCV: 71 fL — ABNORMAL LOW (ref 79–97)
MONOS ABS: 0.7 10*3/uL (ref 0.1–0.9)
Monocytes: 7 %
Neutrophils Absolute: 7.2 10*3/uL — ABNORMAL HIGH (ref 1.4–7.0)
Neutrophils: 70 %
Platelets: 293 10*3/uL (ref 150–379)
RBC: 5.08 x10E6/uL (ref 3.77–5.28)
RDW: 15.1 % (ref 12.3–15.4)
WBC: 10.1 10*3/uL (ref 3.4–10.8)

## 2015-09-02 LAB — HEMOGLOBINOPATHY EVALUATION
HEMOGLOBIN A2 QUANTITATION: 3 % (ref 0.7–3.1)
HGB C: 30.8 % — AB
HGB S: 0 %
Hemoglobin F Quantitation: 0 % (ref 0.0–2.0)
Hgb A: 66.2 % — ABNORMAL LOW (ref 94.0–98.0)

## 2015-09-02 LAB — LIPID PANEL W/O CHOL/HDL RATIO
Cholesterol, Total: 151 mg/dL (ref 100–199)
HDL: 41 mg/dL (ref >39)
LDL Calculated: 87 mg/dL (ref 0–99)
TRIGLYCERIDES: 113 mg/dL (ref 0–149)
VLDL Cholesterol Cal: 23 mg/dL (ref 5–40)

## 2015-09-02 LAB — HGB A1C W/O EAG: HEMOGLOBIN A1C: 7.7 % — AB (ref 4.8–5.6)

## 2015-09-02 LAB — TSH: TSH: 0.952 u[IU]/mL (ref 0.450–4.500)

## 2015-09-02 LAB — FERRITIN: Ferritin: 258 ng/mL — ABNORMAL HIGH (ref 15–150)

## 2015-09-02 LAB — MICROALBUMIN / CREATININE URINE RATIO
Creatinine, Urine: 141 mg/dL
MICROALB/CREAT RATIO: 4.3 mg/g creat (ref 0.0–30.0)
MICROALBUM., U, RANDOM: 6 ug/mL

## 2015-09-02 NOTE — Telephone Encounter (Signed)
Hemoglobin C trait; too much ferritin; call pt with results

## 2015-09-11 ENCOUNTER — Other Ambulatory Visit: Payer: Self-pay | Admitting: Family Medicine

## 2015-09-18 DIAGNOSIS — D582 Other hemoglobinopathies: Secondary | ICD-10-CM | POA: Insufficient documentation

## 2015-09-18 DIAGNOSIS — R7989 Other specified abnormal findings of blood chemistry: Secondary | ICD-10-CM | POA: Insufficient documentation

## 2015-09-18 NOTE — Telephone Encounter (Signed)
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

## 2015-09-20 NOTE — Telephone Encounter (Signed)
I talked with patient about labs; she can't take metformin; she wants to buckle down, no more pills right now Will have her see heme; too much iron; she is not taking extra

## 2015-10-29 ENCOUNTER — Inpatient Hospital Stay: Payer: 59 | Attending: Oncology | Admitting: Oncology

## 2015-10-29 ENCOUNTER — Inpatient Hospital Stay: Payer: 59

## 2015-10-29 VITALS — BP 129/83 | HR 82 | Temp 97.4°F | Resp 16 | Wt 213.6 lb

## 2015-10-29 DIAGNOSIS — J328 Other chronic sinusitis: Secondary | ICD-10-CM | POA: Diagnosis not present

## 2015-10-29 DIAGNOSIS — I10 Essential (primary) hypertension: Secondary | ICD-10-CM | POA: Insufficient documentation

## 2015-10-29 DIAGNOSIS — E786 Lipoprotein deficiency: Secondary | ICD-10-CM

## 2015-10-29 DIAGNOSIS — E7800 Pure hypercholesterolemia, unspecified: Secondary | ICD-10-CM | POA: Insufficient documentation

## 2015-10-29 DIAGNOSIS — Z7982 Long term (current) use of aspirin: Secondary | ICD-10-CM | POA: Insufficient documentation

## 2015-10-29 DIAGNOSIS — Z79899 Other long term (current) drug therapy: Secondary | ICD-10-CM | POA: Diagnosis not present

## 2015-10-29 DIAGNOSIS — D582 Other hemoglobinopathies: Secondary | ICD-10-CM | POA: Insufficient documentation

## 2015-10-29 DIAGNOSIS — R7989 Other specified abnormal findings of blood chemistry: Secondary | ICD-10-CM

## 2015-10-29 DIAGNOSIS — E119 Type 2 diabetes mellitus without complications: Secondary | ICD-10-CM | POA: Insufficient documentation

## 2015-10-29 DIAGNOSIS — K219 Gastro-esophageal reflux disease without esophagitis: Secondary | ICD-10-CM | POA: Diagnosis not present

## 2015-10-29 DIAGNOSIS — E876 Hypokalemia: Secondary | ICD-10-CM | POA: Insufficient documentation

## 2015-10-29 LAB — COMPREHENSIVE METABOLIC PANEL
ALK PHOS: 66 U/L (ref 38–126)
ALT: 19 U/L (ref 14–54)
ANION GAP: 4 — AB (ref 5–15)
AST: 16 U/L (ref 15–41)
Albumin: 3.7 g/dL (ref 3.5–5.0)
BILIRUBIN TOTAL: 0.7 mg/dL (ref 0.3–1.2)
BUN: 13 mg/dL (ref 6–20)
CALCIUM: 8.8 mg/dL — AB (ref 8.9–10.3)
CO2: 27 mmol/L (ref 22–32)
Chloride: 104 mmol/L (ref 101–111)
Creatinine, Ser: 0.63 mg/dL (ref 0.44–1.00)
GFR calc Af Amer: >60 mL/min (ref >60)
GFR calc non Af Amer: >60 mL/min (ref >60)
GLUCOSE: 165 mg/dL — AB (ref 65–99)
Potassium: 3.8 mmol/L (ref 3.5–5.1)
Sodium: 135 mmol/L (ref 135–145)
TOTAL PROTEIN: 6.8 g/dL (ref 6.5–8.1)

## 2015-10-29 LAB — FERRITIN: FERRITIN: 176 ng/mL (ref 11–307)

## 2015-10-29 LAB — IRON AND TIBC
Iron: 59 ug/dL (ref 28–170)
Saturation Ratios: 24 % (ref 10.4–31.8)
TIBC: 249 ug/dL — AB (ref 250–450)
UIBC: 190 ug/dL

## 2015-10-29 LAB — SEDIMENTATION RATE: Sed Rate: 14 mm/hr (ref 0–30)

## 2015-10-29 NOTE — Progress Notes (Signed)
Patient here for new patient hematology evaluation.

## 2015-11-04 LAB — HEMOCHROMATOSIS DNA-PCR(C282Y,H63D)

## 2015-11-07 NOTE — Progress Notes (Signed)
[facility]  [phone] [phone]  ID: Elizabeth Scott OB: [birth year]  MR#: [id number]  NUU#:[id number]  Patient Care Team: Kerman Passey, MD as PCP - General (Family Medicine)  CHIEF COMPLAINT:  Chief Complaint  Patient presents with  . New Evaluation    INTERVAL HISTORY: Patient is a 55 year old female who was found to have a persistently elevated ferritin as well as microcytosis on routine blood work. Hemoglobinopathy revealed hemoglobin C trait. She currently feels well and is asymptomatic. She is no neurologic complaints. She denies any weakness or fatigue. She has good appetite and denies weight loss. She denies any fevers or recent illnesses. She has no chest pain or shortness of breath. She denies any nausea, vomiting, constipation, or diarrhea. She has no urinary complaints. Patient feels at her baseline and offers no specific complaints today.  REVIEW OF SYSTEMS:   Review of Systems  Constitutional: Negative.  Negative for fever, weight loss and malaise/fatigue.  Respiratory: Negative.  Negative for cough and shortness of breath.   Cardiovascular: Negative.  Negative for chest pain.  Gastrointestinal: Negative.  Negative for melena.  Genitourinary: Negative for hematuria.  Musculoskeletal: Negative.   Neurological: Negative.  Negative for weakness.  Endo/Heme/Allergies: Does not bruise/bleed easily.    As per HPI. Otherwise, a complete review of systems is negatve.  PAST MEDICAL HISTORY: Past Medical History  Diagnosis Date  . Hypertension   . Diabetes mellitus without complication (HCC)   . High cholesterol   . GERD (gastroesophageal reflux disease)   . Chronic sinusitis   . History of gestational diabetes   . Allergy   . Hypopotassemia   . Microcytosis   . History of abnormal cervical Pap smear     Prior to Hysterectomy, normal paps for 2-3 years after Hysterectomy    PAST SURGICAL HISTORY: Past Surgical  History  Procedure Laterality Date  . Cesarean section    . Tubal ligation    . Abdominal hysterectomy  2003    still has ovaries-for menorrhagia  . Colonoscopy  02/22/14    repeat 5 years  . Esophagogastroduodenoscopy  02/22/14    FAMILY HISTORY Family History  Problem Relation Age of Onset  . Diabetes Mother   . Heart disease Mother   . Hypertension Mother   . Stroke Mother   . Diabetes Father   . Hypertension Father   . Asthma Sister   . Diabetes Sister   . Hypertension Sister   . Lung disease Sister   . Hypertension Brother   . Heart disease Brother   . Stroke Brother   . Asthma Sister   . Diabetes Sister   . Hypertension Sister   . Cancer Neg Hx   . COPD Neg Hx        ADVANCED DIRECTIVES:    HEALTH MAINTENANCE: Social History  Substance Use Topics  . Smoking status: Never Smoker   . Smokeless tobacco: Never Used  . Alcohol Use: No     Colonoscopy:  PAP:  Bone density:  Lipid panel:  Allergies  Allergen Reactions  . Metformin And Related Other (See Comments)    Rash, Thrush     Current Outpatient Prescriptions  Medication Sig Dispense Refill  . albuterol (PROVENTIL HFA;VENTOLIN HFA) 108 (90 BASE) MCG/ACT inhaler Inhale 2 puffs into the lungs every 2 (two) hours as needed for wheezing or shortness of breath.    Marland Kitchen amLODipine (NORVASC) 5 MG tablet Take 1 tablet (5 mg total) by mouth daily.  90 tablet 1  . aspirin 81 MG tablet Take 81 mg by mouth daily.    Marland Kitchen. atorvastatin (LIPITOR) 40 MG tablet Take 1 tablet (40 mg total) by mouth daily. 90 tablet 1  . EPINEPHrine (EPIPEN 2-PAK) 0.3 mg/0.3 mL IJ SOAJ injection Inject 0.3 mLs (0.3 mg total) into the muscle once. For life-threatening emergency only 1 Device 1  . glipiZIDE (GLUCOTROL XL) 10 MG 24 hr tablet TAKE 1 TABLET BY MOUTH IN THE MORNING 90 tablet 1  . lisinopril-hydrochlorothiazide (PRINZIDE,ZESTORETIC) 20-25 MG tablet Take 1 tablet by mouth daily. 90 tablet 1  . pantoprazole (PROTONIX) 40 MG tablet  Take 40 mg by mouth daily.    . TRADJENTA 5 MG TABS tablet TAKE 1 TABLET BY MOUTH EVERY DAY 90 tablet 3   No current facility-administered medications for this visit.    OBJECTIVE: Filed Vitals:   10/29/15 1047  BP: 129/83  Pulse: 82  Temp: 97.4 F (36.3 C)  Resp: 16     Body mass index is 37.52 kg/(m^2).    ECOG FS:0 - Asymptomatic  General: Well-developed, well-nourished, no acute distress. Eyes: Pink conjunctiva, anicteric sclera. HEENT: Normocephalic, moist mucous membranes, clear oropharnyx. Lungs: Clear to auscultation bilaterally. Heart: Regular rate and rhythm. No rubs, murmurs, or gallops. Abdomen: Soft, nontender, nondistended. No organomegaly noted, normoactive bowel sounds. Musculoskeletal: No edema, cyanosis, or clubbing. Neuro: Alert, answering all questions appropriately. Cranial nerves grossly intact. Skin: No rashes or petechiae noted. Psych: Normal affect. Lymphatics: No cervical, calvicular, axillary or inguinal LAD.   LAB RESULTS:  Lab Results  Component Value Date   NA 135 10/29/2015   K 3.8 10/29/2015   CL 104 10/29/2015   CO2 27 10/29/2015   GLUCOSE 165* 10/29/2015   BUN 13 10/29/2015   CREATININE 0.63 10/29/2015   CALCIUM 8.8* 10/29/2015   PROT 6.8 10/29/2015   ALBUMIN 3.7 10/29/2015   AST 16 10/29/2015   ALT 19 10/29/2015   ALKPHOS 66 10/29/2015   BILITOT 0.7 10/29/2015   GFRNONAA >60 10/29/2015   GFRAA >60 10/29/2015    Lab Results  Component Value Date   WBC 10.1 08/30/2015   NEUTROABS 7.2* 08/30/2015   HCT 36.1 08/30/2015   MCV 71* 08/30/2015   PLT 293 08/30/2015   Lab Results  Component Value Date   IRON 59 10/29/2015   TIBC 249* 10/29/2015   IRONPCTSAT 24 10/29/2015    Lab Results  Component Value Date   FERRITIN 176 10/29/2015     STUDIES: No results found.  ASSESSMENT: Hemoglobin C trait  PLAN:    1. Hemoglobin C trait: Confirmed by hemoglobinopathy profile. This is likely the etiology of her persistent  microcytosis. Patient's with hemoglobin C trait typically have a mild anemia with microcytosis which is clinically insignificant. Of note, both patient's children have been diagnosed with hemoglobin C trait. No intervention is needed at this time. Iron stores are within normal limits. 2. Elevated ferritin: Ferritin levels are within normal limits today. Hemachromatosis mutation is negative. No intervention is needed at this time. Return to clinic in 3 months with repeat laboratory work and further evaluation. If everything remains within normal limits, patient can likely be discharged from clinic.  Patient expressed understanding and was in agreement with this plan. She also understands that She can call clinic at any time with any questions, concerns, or complaints.   Jeralyn Ruthsimothy J Caia Lofaro, MD   11/07/2015 7:27 AM

## 2015-11-08 ENCOUNTER — Other Ambulatory Visit: Payer: Self-pay | Admitting: Family Medicine

## 2016-01-28 ENCOUNTER — Other Ambulatory Visit: Payer: 59

## 2016-01-28 ENCOUNTER — Ambulatory Visit: Payer: 59 | Admitting: Oncology

## 2016-02-25 ENCOUNTER — Inpatient Hospital Stay: Payer: 59 | Attending: Family Medicine

## 2016-02-25 ENCOUNTER — Ambulatory Visit: Payer: 59 | Admitting: Oncology

## 2016-03-03 ENCOUNTER — Ambulatory Visit: Payer: 59 | Admitting: Family Medicine

## 2016-03-27 ENCOUNTER — Encounter: Payer: Self-pay | Admitting: Emergency Medicine

## 2016-03-27 ENCOUNTER — Ambulatory Visit (INDEPENDENT_AMBULATORY_CARE_PROVIDER_SITE_OTHER): Payer: 59

## 2016-03-27 ENCOUNTER — Ambulatory Visit
Admission: EM | Admit: 2016-03-27 | Discharge: 2016-03-27 | Disposition: A | Discharge status: Home / Self Care | Payer: 59 | Attending: Family Medicine | Admitting: Family Medicine

## 2016-03-27 ENCOUNTER — Telehealth: Payer: Self-pay | Admitting: Family Medicine

## 2016-03-27 DIAGNOSIS — E876 Hypokalemia: Secondary | ICD-10-CM | POA: Diagnosis not present

## 2016-03-27 DIAGNOSIS — J029 Acute pharyngitis, unspecified: Secondary | ICD-10-CM | POA: Diagnosis not present

## 2016-03-27 DIAGNOSIS — R05 Cough: Secondary | ICD-10-CM | POA: Diagnosis not present

## 2016-03-27 DIAGNOSIS — R059 Cough, unspecified: Secondary | ICD-10-CM

## 2016-03-27 DIAGNOSIS — R5383 Other fatigue: Secondary | ICD-10-CM | POA: Diagnosis not present

## 2016-03-27 LAB — COMPREHENSIVE METABOLIC PANEL
ALT: 38 U/L (ref 14–54)
ANION GAP: 9 (ref 5–15)
AST: 30 U/L (ref 15–41)
Albumin: 4 g/dL (ref 3.5–5.0)
Alkaline Phosphatase: 91 U/L (ref 38–126)
BILIRUBIN TOTAL: 0.8 mg/dL (ref 0.3–1.2)
BUN: 13 mg/dL (ref 6–20)
CHLORIDE: 95 mmol/L — AB (ref 101–111)
CO2: 30 mmol/L (ref 22–32)
Calcium: 9.2 mg/dL (ref 8.9–10.3)
Creatinine, Ser: 0.87 mg/dL (ref 0.44–1.00)
Glucose, Bld: 288 mg/dL — ABNORMAL HIGH (ref 65–99)
POTASSIUM: 3.1 mmol/L — AB (ref 3.5–5.1)
Sodium: 134 mmol/L — ABNORMAL LOW (ref 135–145)
TOTAL PROTEIN: 7.7 g/dL (ref 6.5–8.1)

## 2016-03-27 LAB — CBC WITH DIFFERENTIAL/PLATELET
BAND NEUTROPHILS: 1 %
BASOS ABS: 0.3 10*3/uL — AB (ref 0–0.1)
BLASTS: 0 %
Basophils Relative: 2 %
EOS ABS: 0 10*3/uL (ref 0–0.7)
Eosinophils Relative: 0 %
HCT: 39.5 % (ref 35.0–47.0)
Hemoglobin: 13.5 g/dL (ref 12.0–16.0)
Lymphocytes Relative: 60 %
Lymphs Abs: 9.3 10*3/uL — ABNORMAL HIGH (ref 1.0–3.6)
MCH: 24.1 pg — ABNORMAL LOW (ref 26.0–34.0)
MCHC: 34.2 g/dL (ref 32.0–36.0)
MCV: 70.5 fL — AB (ref 80.0–100.0)
METAMYELOCYTES PCT: 4 %
MONOS PCT: 15 %
Monocytes Absolute: 2.3 10*3/uL — ABNORMAL HIGH (ref 0.2–0.9)
Myelocytes: 0 %
NEUTROS ABS: 3.5 10*3/uL (ref 1.4–6.5)
Neutrophils Relative %: 18 %
Other: 0 %
PLATELETS: 191 10*3/uL (ref 150–440)
PROMYELOCYTES ABS: 0 %
RBC: 5.61 MIL/uL — ABNORMAL HIGH (ref 3.80–5.20)
RDW: 15.2 % — ABNORMAL HIGH (ref 11.5–14.5)
WBC: 15.4 10*3/uL — ABNORMAL HIGH (ref 3.6–11.0)
nRBC: 0 /100 WBC

## 2016-03-27 LAB — URINALYSIS COMPLETE WITH MICROSCOPIC (ARMC ONLY)
BILIRUBIN URINE: NEGATIVE
Glucose, UA: 500 mg/dL — AB
HGB URINE DIPSTICK: NEGATIVE
KETONES UR: NEGATIVE mg/dL
LEUKOCYTES UA: NEGATIVE
NITRITE: NEGATIVE
PH: 6 (ref 5.0–8.0)
PROTEIN: NEGATIVE mg/dL
SPECIFIC GRAVITY, URINE: 1.015 (ref 1.005–1.030)

## 2016-03-27 LAB — RAPID STREP SCREEN (MED CTR MEBANE ONLY): Streptococcus, Group A Screen (Direct): NEGATIVE

## 2016-03-27 MED ORDER — CEPHALEXIN 500 MG PO CAPS: 500.0000 mg | ORAL_CAPSULE | Freq: Two times a day (BID) | ORAL | 0 refills | Status: DC

## 2016-03-27 MED ORDER — POTASSIUM CHLORIDE CRYS ER 20 MEQ PO TBCR
40.0000 meq | EXTENDED_RELEASE_TABLET | Freq: Once | ORAL | Status: AC
  Administered 2016-03-27: 40 meq via ORAL

## 2016-03-27 NOTE — ED Triage Notes (Signed)
Patient c/o feeling tired and ongoing cough for several days.  Patient denies fevers.

## 2016-03-27 NOTE — Discharge Instructions (Signed)
Take medication as prescribed. Rest. Drink plenty of fluids.  ° °Follow up with your primary care physician this week. Return to Urgent care or ER for new or worsening concerns.  ° °

## 2016-03-27 NOTE — Telephone Encounter (Signed)
FSBS running up, 245 this morning; 410 this afternoon Feeling sick, doesn't feel good Like the flu, thought she might be catching something, weak and tired I encouraged her to get checked out a Urgent Care; may need testing to see if infection, as infection can make sugars go up; she agrees

## 2016-03-27 NOTE — ED Provider Notes (Signed)
[facility] ____________________________________________  Time seen: Approximately 1710 PM  I have reviewed the triage vital signs and the nursing notes.   HISTORY  Chief Complaint Cough and Fatigue   HPI Elizabeth Scott is a 55 y.o. female presents for complaints of runny nose, congestion and cough with sore throat x 1.5 weeks, improving but with continued mild sore throat. Reports her niece recently with strep throat. States her symptoms have been improving but last two days have felt more tired. Reports has continued to remain active and work. Denies fevers. Denies any recent medication changes. Denies recent sickness or antibiotics.   Patient also reports her blood sugar this afternoon was in the 300s. States last ate around noon. States drinks some water, but does drinks sodas often. Reports taking her daily medications as prescribed.   Denies chest pain, shortness of breath, chest pain with deep breath, abdominal pain, back pain, rash, headache, dizziness, vision changes, weakness, recent sickness or recent hospitalization.  PCP: Baruch Gouty, MD (has appt Sept 1 this week) No LMP recorded. Patient has had a hysterectomy.   Past Medical History:  Diagnosis Date  . Allergy   . Chronic sinusitis   . Diabetes mellitus without complication (HCC)   . GERD (gastroesophageal reflux disease)   . High cholesterol   . History of abnormal cervical Pap smear    Prior to Hysterectomy, normal paps for 2-3 years after Hysterectomy  . History of gestational diabetes   . Hypertension   . Hypopotassemia   . Microcytosis     Patient Active Problem List   Diagnosis Date Noted  . Leukocytosis 03/31/2016  . Encounter for medication monitoring 03/31/2016  . Elevated ferritin 10/29/2015  . Elevated ferritin level 09/18/2015  . 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 Walnut allergy 08/30/2015  . Preventative health care 08/30/2015  . Type 2 diabetes mellitus,  uncontrolled (HCC) 04/27/2015  . Hypercholesterolemia 04/27/2015  . Essential hypertension, benign 04/27/2015  . Microcytosis 04/27/2015  . Obesity 04/27/2015  . Gastroesophageal reflux disease without esophagitis 01/08/2015    Past Surgical History:  Procedure Laterality Date  . ABDOMINAL HYSTERECTOMY  2003   still has ovaries-for menorrhagia  . CESAREAN SECTION    . COLONOSCOPY  02/22/14   repeat 5 years  . ESOPHAGOGASTRODUODENOSCOPY  02/22/14  . TUBAL LIGATION      Current Outpatient Rx  . Order #: [id number] Class: Historical Med  . Order #: [id number] Class: Normal  . Order #: [id number] Class: Historical Med  . Order #: [id number] Class: Normal  . Order #: [id number] Class: Normal  . Order #: [id number] Class: Normal  . Order #: [id number] Class: Normal  . Order #: [id number] Class: Normal  . Order #: [id number] Class: Normal  . Order #: [id number] Class: Normal    No current facility-administered medications for this encounter.   Current Outpatient Prescriptions:  .  albuterol (PROVENTIL HFA;VENTOLIN HFA) 108 (90 BASE) MCG/ACT inhaler, Inhale 2 puffs into the lungs every 2 (two) hours as needed for wheezing or shortness of breath., Disp: , Rfl:  .  amLODipine (NORVASC) 5 MG tablet, Take 1 tablet (5 mg total) by mouth daily., Disp: 90 tablet, Rfl: 1 .  aspirin 81 MG tablet, Take 81 mg by mouth daily., Disp: , Rfl:  .  atorvastatin (LIPITOR) 40 MG tablet, Take 1 tablet (40 mg total) by mouth daily., Disp: 90 tablet, Rfl: 1 .  cephALEXin (KEFLEX) 500 MG capsule, Take 1 capsule (500 mg total) by mouth 2 (two) times daily., Disp: 14 capsule, Rfl: 0 .  EPINEPHrine (EPIPEN 2-PAK) 0.3 mg/0.3 mL IJ SOAJ injection, Inject 0.3 mLs (0.3 mg total) into the muscle once. For life-threatening emergency only, Disp: 1 Device, Rfl: 1 .  glipiZIDE (GLUCOTROL XL) 10 MG 24 hr tablet, TAKE 1 TABLET BY MOUTH IN THE MORNING, Disp: 90 tablet, Rfl: 1 .  lisinopril-hydrochlorothiazide (PRINZIDE,ZESTORETIC) 20-25 MG  tablet, Take 1 tablet by mouth daily., Disp: 90 tablet, Rfl: 1 .  pantoprazole (PROTONIX) 40 MG tablet, TAKE ONE TABLET BY MOUTH DAILY, Disp: 90 tablet, Rfl: 2 .  TRADJENTA 5 MG TABS tablet, TAKE 1 TABLET BY MOUTH EVERY DAY, Disp: 90 tablet, Rfl: 3  Allergies Metformin and related  Family History  Problem Relation Age of Onset  . Diabetes Mother   . Heart disease Mother   . Hypertension Mother   . Stroke Mother   . Diabetes Father   . Hypertension Father   . Asthma Sister   . Diabetes Sister   . Hypertension Sister   . Lung disease Sister   . Hypertension Brother   . Heart disease Brother   . Stroke Brother   . Asthma Sister   . Diabetes Sister   . Hypertension Sister   . Cancer Neg Hx   . COPD Neg Hx     Social History Social History  Substance Use Topics  . Smoking status: Never Smoker  . Smokeless tobacco: Never Used  . Alcohol use No    Review of Systems Constitutional: No fever/chills Eyes: No visual changes. ENT: Positive sore throat. As above.  Cardiovascular: Denies chest pain. Respiratory: Denies shortness of breath. Gastrointestinal: No abdominal pain.  No nausea, no vomiting.  No diarrhea.  No constipation. Genitourinary: Negative for dysuria. Musculoskeletal: Negative for back pain. Skin: Negative for rash. Neurological: Negative for headaches, focal weakness or numbness.  10-point ROS otherwise negative.  ____________________________________________   PHYSICAL EXAM:  VITAL SIGNS: ED Triage Vitals  Enc Vitals Group     BP 03/27/16 1648 131/83     Pulse Rate 03/27/16 1648 98     Resp 03/27/16 1648 16     Temp 03/27/16 1648 97.6 F (36.4 C)     Temp Source 03/27/16 1648 Tympanic     SpO2 03/27/16 1648 99 %     Weight 03/27/16 1649 210 lb (95.3 kg)     Height 03/27/16 1649 5\' 3"  (1.6 m)     Head Circumference --      Peak Flow --      Pain Score 03/27/16 1650 0     Pain Loc --      Pain Edu? --      Excl. in GC? --      Constitutional: Alert and oriented. Well appearing and in no acute distress. Eyes: Conjunctivae are normal. PERRL. EOMI. Head: Atraumatic. No sinus tenderness to palpation. No swelling. No erythema.  Ears: no erythema, normal TMs bilaterally.   Nose:Nasal congestion with clear rhinorrhea  Mouth/Throat: Mucous membranes are moist. Mild pharyngeal erythema. No tonsillar swelling or exudate.  Neck: No stridor.  No cervical spine tenderness to palpation. Hematological/Lymphatic/Immunilogical: No cervical lymphadenopathy. Cardiovascular: Normal rate, regular rhythm. Grossly normal heart sounds.  Good peripheral circulation. Respiratory: Normal respiratory effort.  No retractions. Lungs CTAB.No wheezes, rales or rhonchi. Good air movement.  Gastrointestinal: Soft and nontender. Obese abdomen. No CVA tenderness. Musculoskeletal: No lower or upper extremity tenderness nor edema. No cervical, thoracic or lumbar tenderness to palpation. Neurologic:  Normal speech and language. No gross focal neurologic deficits are appreciated.  No gait instability. Skin:  Skin is warm, dry and intact. No rash noted. Psychiatric: Mood and affect are normal. Speech and behavior are normal.  ___________________________________________   LABS (all labs ordered are listed, but only abnormal results are displayed)  Labs Reviewed  CBC WITH DIFFERENTIAL/PLATELET - Abnormal; Notable for the following:       Result Value   WBC 15.4 (*)    RBC 5.61 (*)    MCV 70.5 (*)    MCH 24.1 (*)    RDW 15.2 (*)    Lymphs Abs 9.3 (*)    Monocytes Absolute 2.3 (*)    Basophils Absolute 0.3 (*)    All other components within normal limits  COMPREHENSIVE METABOLIC PANEL - Abnormal; Notable for the following:    Sodium 134 (*)    Potassium 3.1 (*)    Chloride 95 (*)    Glucose, Bld 288 (*)    All other components within normal limits  URINALYSIS COMPLETEWITH MICROSCOPIC ([facility] ONLY) - Abnormal; Notable for the following:     Glucose, UA 500 (*)    Bacteria, UA FEW (*)    Squamous Epithelial / LPF 0-5 (*)    All other components within normal limits  RAPID STREP SCREEN (NOT AT [facility])  CULTURE, GROUP A STREP [facility])   ____________________________________________  RADIOLOGY  EXAM: CHEST  2 VIEW  COMPARISON:  Chest x-ray 01/22/2015.  FINDINGS: Lung volumes are normal. No consolidative airspace disease. No pleural effusions. No pneumothorax. No pulmonary nodule or mass noted. Pulmonary vasculature and the cardiomediastinal silhouette are within normal limits.  IMPRESSION: No radiographic evidence of acute cardiopulmonary disease.   Electronically Signed   By: Trudie Reed M.D.   On: 03/27/2016 17:49 ____________________________________________   PROCEDURES Procedures    INITIAL IMPRESSION / ASSESSMENT AND PLAN / ED COURSE  Pertinent labs & imaging results that were available during my care of the patient were reviewed by me and considered in my medical decision making (see chart for details).  Well-appearing patient. No acute distress. Presents for the complaints of recent sore throat, cough and congestion with some complaints of generalized feeling tired. Reports has continued to remain active and continue to work. Denies fevers. Will evaluate strep, chest x-ray, CBC, BMP and urinalysis.  Labs reviewed. Potassium 3.1, glucose 288, strep negative, will culture. CBC showing WBC of 15.4. An urinalysis with glucose and few bacteria. Discussed in detail with patient. 40 mEq potassium once in urgent care. Discussed in detail with patient as patient is taking lisinopril, need to closely monitor potassium on lab results. Concern for possible infection source from urine versus strep. Patient's niece just recently had strep throat. Discussed in detail with patient as well as with sore throat, bacteria and urine as well as elevated white blood cell count will conservatively place patient on oral  cephalexin. Also discussed monitoring blood sugars at home as well as diet. Encouraged patient to decrease or stop soda intake. Again discussed in detail to follow-up closely with PCP this week as scheduled on September 1. Discussed strict follow-up and return parameters prior to being seen by PCP. Discussed patient and results with Dr. Judd Gaudier who agrees with patient plan of care.  Discussed follow up with Primary care physician this week. Discussed follow up and return parameters including no resolution or any worsening concerns. Patient verbalized understanding and agreed to plan.   ____________________________________________   FINAL CLINICAL IMPRESSION(S) / ED DIAGNOSES  Final diagnoses:  Pharyngitis  Cough  Hypokalemia  Other  fatigue     Discharge Medication List as of 03/27/2016  6:39 PM    START taking these medications   Details  cephALEXin (KEFLEX) 500 MG capsule Take 1 capsule (500 mg total) by mouth 2 (two) times daily., Starting Mon 03/27/2016, Normal        Note: This dictation was prepared with Dragon dictation along with smaller phrase technology. Any transcriptional errors that result from this process are unintentional.    Clinical Course      Renford Dills, NP 04/23/16 [phone]

## 2016-03-30 ENCOUNTER — Ambulatory Visit: Payer: 59 | Admitting: Family Medicine

## 2016-03-30 LAB — CULTURE, GROUP A STREP (THRC)

## 2016-03-31 ENCOUNTER — Encounter: Payer: Self-pay | Admitting: Family Medicine

## 2016-03-31 ENCOUNTER — Ambulatory Visit (INDEPENDENT_AMBULATORY_CARE_PROVIDER_SITE_OTHER): Payer: 59 | Admitting: Family Medicine

## 2016-03-31 DIAGNOSIS — R718 Other abnormality of red blood cells: Secondary | ICD-10-CM

## 2016-03-31 DIAGNOSIS — D72829 Elevated white blood cell count, unspecified: Secondary | ICD-10-CM | POA: Diagnosis not present

## 2016-03-31 DIAGNOSIS — E7800 Pure hypercholesterolemia, unspecified: Secondary | ICD-10-CM | POA: Diagnosis not present

## 2016-03-31 DIAGNOSIS — E1165 Type 2 diabetes mellitus with hyperglycemia: Secondary | ICD-10-CM

## 2016-03-31 DIAGNOSIS — I10 Essential (primary) hypertension: Secondary | ICD-10-CM

## 2016-03-31 DIAGNOSIS — Z5181 Encounter for therapeutic drug level monitoring: Secondary | ICD-10-CM | POA: Diagnosis not present

## 2016-03-31 DIAGNOSIS — E669 Obesity, unspecified: Secondary | ICD-10-CM

## 2016-03-31 DIAGNOSIS — K219 Gastro-esophageal reflux disease without esophagitis: Secondary | ICD-10-CM

## 2016-03-31 LAB — CBC WITH DIFFERENTIAL/PLATELET
BASOS PCT: 1 %
Basophils Absolute: 83 cells/uL (ref 0–200)
EOS PCT: 0 %
Eosinophils Absolute: 0 cells/uL — ABNORMAL LOW (ref 15–500)
HCT: 35.8 % (ref 35.0–45.0)
Hemoglobin: 12.1 g/dL (ref 11.7–15.5)
LYMPHS PCT: 61 %
Lymphs Abs: 5063 cells/uL (ref 850–3900)
MCH: 24.1 pg — ABNORMAL LOW (ref 27.0–33.0)
MCHC: 33.8 g/dL (ref 32.0–36.0)
MCV: 71.3 fL — ABNORMAL LOW (ref 80.0–100.0)
MONOS PCT: 11 %
MPV: 10.6 fL (ref 7.5–12.5)
Monocytes Absolute: 913 cells/uL (ref 200–950)
NEUTROS ABS: 2241 {cells}/uL (ref 1500–7800)
Neutrophils Relative %: 27 %
PLATELETS: 202 10*3/uL (ref 140–400)
RBC: 5.02 MIL/uL (ref 3.80–5.10)
RDW: 15.8 % — AB (ref 11.0–15.0)
WBC: 8.3 10*3/uL (ref 3.8–10.8)

## 2016-03-31 LAB — COMPLETE METABOLIC PANEL WITH GFR
ALBUMIN: 3.6 g/dL (ref 3.6–5.1)
ALK PHOS: 77 U/L (ref 33–130)
ALT: 27 U/L (ref 6–29)
AST: 21 U/L (ref 10–35)
BILIRUBIN TOTAL: 0.5 mg/dL (ref 0.2–1.2)
BUN: 15 mg/dL (ref 7–25)
CALCIUM: 8.8 mg/dL (ref 8.6–10.4)
CO2: 30 mmol/L (ref 20–31)
CREATININE: 0.83 mg/dL (ref 0.50–1.05)
Chloride: 101 mmol/L (ref 98–110)
GFR, EST NON AFRICAN AMERICAN: 80 mL/min (ref >=60)
Glucose, Bld: 219 mg/dL — ABNORMAL HIGH (ref 65–99)
Potassium: 4 mmol/L (ref 3.5–5.3)
Sodium: 136 mmol/L (ref 135–146)
TOTAL PROTEIN: 6.6 g/dL (ref 6.1–8.1)

## 2016-03-31 LAB — LIPID PANEL
CHOL/HDL RATIO: 8.3 ratio — AB (ref <=5.0)
CHOLESTEROL: 158 mg/dL (ref 125–200)
HDL: 19 mg/dL — AB (ref >=46)
LDL Cholesterol: 87 mg/dL (ref <130)
TRIGLYCERIDES: 259 mg/dL — AB (ref <150)
VLDL: 52 mg/dL — AB (ref <30)

## 2016-03-31 NOTE — Patient Instructions (Signed)
Try to skip one or two days a week of the acid reflux medicine Let's get labs today Check out the information at familydoctor.org entitled "Nutrition for Weight Loss: What You Need to Know about Fad Diets" Try to lose between 1-2 pounds per week by taking in fewer calories and burning off more calories You can succeed by limiting portions, limiting foods dense in calories and fat, becoming more active, and drinking 8 glasses of water a day (64 ounces) Don't skip meals, especially breakfast, as skipping meals may alter your metabolism Do not use over-the-counter weight loss pills or gimmicks that claim rapid weight loss A healthy BMI (or body mass index) is between 18.5 and 24.9 You can calculate your ideal BMI at the NIH website [employer]huhttp://www.nhlbi.nih.gov/health/educational/lose_wt/BMI/bmicalc.htm

## 2016-03-31 NOTE — Assessment & Plan Note (Signed)
Check labs today.

## 2016-03-31 NOTE — Assessment & Plan Note (Signed)
Mostly lymphocytes and monocytes; recheck today

## 2016-03-31 NOTE — Assessment & Plan Note (Signed)
Foot exam today by MD; limit sodas and sugars and bread; see eye doctor yearly

## 2016-03-31 NOTE — Assessment & Plan Note (Signed)
Well-controlled, try the DASH guidelines; limit black licorice, NSAIDs, decongestants

## 2016-03-31 NOTE — Assessment & Plan Note (Signed)
Hg C trait; was referred to heme

## 2016-03-31 NOTE — Progress Notes (Signed)
BP 122/74   Pulse 95   Temp 98.3 F (36.8 C) (Oral)   Resp 16   Wt 208 lb (94.3 kg)   SpO2 96%   BMI 36.85 kg/m    Subjective:    Patient ID: Elizabeth Scott, female    DOB: 09/13/1960, 55 y.o.   MRN: [id number]  HPI: Elizabeth Scott is a 55 y.o. female  Chief Complaint  Patient presents with  . Follow-up   Recent infection drove sugars up; last check Wed evening at 285; went to urgent care and found to have UTI and pharyngitis; on keflex; potassium 3.1; WBC 15.4k, lymphs 9.3k, monocytes 2.3k; dragging during the day; urine reviewed  Type 2 diabetes; not sure if improved; last A1c Jan 7.7; dry mouth; no blurred vision; seldomly urinates overnight; does not eat much bread; does eat some rice and sugar; drinks Mt Dew, Dr. Reino KentPepper; no coffee; she has tried to switch to diet, but hard to switch; her daughter cut out soft drinks, but patient finds that hard to stop drinking sodas  High cholesterol; taking chol medicine every other day; no sausage; seldomly eats hot dogs; bacon is out; skips breakfast  GERD; seldom has any heartburn on the medicine  [facility] 03/31/2016 08/30/2015  Decreased Interest 0 0  Down, Depressed, Hopeless 0 0  PHQ - 2 Score 0 0   Relevant past medical, surgical, family and social history reviewed Past Medical History:  Diagnosis Date  . Allergy   . Chronic sinusitis   . Diabetes mellitus without complication (HCC)   . GERD (gastroesophageal reflux disease)   . High cholesterol   . History of abnormal cervical Pap smear    Prior to Hysterectomy, normal paps for 2-3 years after Hysterectomy  . History of gestational diabetes   . Hypertension   . Hypopotassemia   . Microcytosis    Past Surgical History:  Procedure Laterality Date  . ABDOMINAL HYSTERECTOMY  2003   still has ovaries-for menorrhagia  . CESAREAN SECTION    . COLONOSCOPY  02/22/14   repeat 5 years  . ESOPHAGOGASTRODUODENOSCOPY  02/22/14  . TUBAL LIGATION       Family History  Problem Relation Age of Onset  . Diabetes Mother   . Heart disease Mother   . Hypertension Mother   . Stroke Mother   . Diabetes Father   . Hypertension Father   . Asthma Sister   . Diabetes Sister   . Hypertension Sister   . Lung disease Sister   . Hypertension Brother   . Heart disease Brother   . Stroke Brother   . Asthma Sister   . Diabetes Sister   . Hypertension Sister   . Cancer Neg Hx   . COPD Neg Hx    Social History  Substance Use Topics  . Smoking status: Never Smoker  . Smokeless tobacco: Never Used  . Alcohol use No   Interim medical history since last visit reviewed. Allergies and medications reviewed  Review of Systems Per HPI unless specifically indicated above     Objective:    BP 122/74   Pulse 95   Temp 98.3 F (36.8 C) (Oral)   Resp 16   Wt 208 lb (94.3 kg)   SpO2 96%   BMI 36.85 kg/m   Wt Readings from Last 3 Encounters:  03/31/16 208 lb (94.3 kg)  03/27/16 210 lb (95.3 kg)  10/29/15 213 lb 10 oz (96.9 kg)    Physical  Exam  Constitutional: She appears well-developed and well-nourished. No distress.  Weight down 5 pounds over last 6 months  HENT:  Head: Normocephalic and atraumatic.  Eyes: EOM are normal. No scleral icterus.  Neck: No thyromegaly present.  Cardiovascular: Normal rate, regular rhythm and normal heart sounds.   No murmur heard. Pulmonary/Chest: Effort normal and breath sounds normal. No respiratory distress. She has no wheezes.  Abdominal: Soft. Bowel sounds are normal. She exhibits no distension.  Musculoskeletal: Normal range of motion. She exhibits no edema.  Neurological: She is alert. She exhibits normal muscle tone.  Skin: Skin is warm and dry. She is not diaphoretic. No pallor.  Psychiatric: She has a normal mood and affect. Her behavior is normal. Judgment and thought content normal.    Diabetic Foot Form - Detailed   Diabetic Foot Exam - detailed Diabetic Foot exam was performed with  the following findings:  Yes 03/31/2016  8:37 AM  Visual Foot Exam completed.:  Yes  Are the toenails long?:  No Are the toenails thick?:  No Are the toenails ingrown?:  No Normal Range of Motion:  Yes Pulse Foot Exam completed.:  Yes  Right Dorsalis Pedis:  Present Left Dorsalis Pedis:  Present  Sensory Foot Exam Completed.:  Yes Swelling:  No Semmes-Weinstein Monofilament Test R Site 1-Great Toe:  Pos L Site 1-Great Toe:  Pos  R Site 4:  Pos L Site 4:  Pos  R Site 5:  Pos L Site 5:  Pos       Results for orders placed or performed during the hospital encounter of 03/27/16  Rapid strep screen  Result Value Ref Range   Streptococcus, Group A Screen (Direct) NEGATIVE NEGATIVE  Culture, group A strep  Result Value Ref Range   Specimen Description THROAT    Special Requests NONE Reflexed from [id number]    Culture      NO GROUP A STREP (S.PYOGENES) ISOLATED Performed at [facility]    Report Status 03/30/2016 FINAL   CBC with Differential  Result Value Ref Range   WBC 15.4 (H) 3.6 - 11.0 K/uL   RBC 5.61 (H) 3.80 - 5.20 MIL/uL   Hemoglobin 13.5 12.0 - 16.0 g/dL   HCT 04.5 40.9 - 81.1 %   MCV 70.5 (L) 80.0 - 100.0 fL   MCH 24.1 (L) 26.0 - 34.0 pg   MCHC 34.2 32.0 - 36.0 g/dL   RDW 91.4 (H) 78.2 - 95.6 %   Platelets 191 150 - 440 K/uL   Neutrophils Relative % 18 %   Lymphocytes Relative 60 %   Monocytes Relative 15 %   Eosinophils Relative 0 %   Basophils Relative 2 %   Band Neutrophils 1 %   Metamyelocytes Relative 4 %   Myelocytes 0 %   Promyelocytes Absolute 0 %   Blasts 0 %   nRBC 0 0 /100 WBC   Other 0 %   Neutro Abs 3.5 1.4 - 6.5 K/uL   Lymphs Abs 9.3 (H) 1.0 - 3.6 K/uL   Monocytes Absolute 2.3 (H) 0.2 - 0.9 K/uL   Eosinophils Absolute 0.0 0 - 0.7 K/uL   Basophils Absolute 0.3 (H) 0 - 0.1 K/uL  Comprehensive metabolic panel  Result Value Ref Range   Sodium 134 (L) 135 - 145 mmol/L   Potassium 3.1 (L) 3.5 - 5.1 mmol/L   Chloride 95 (L) 101 - 111 mmol/L    CO2 30 22 - 32 mmol/L   Glucose, Bld 288 (  H) 65 - 99 mg/dL   BUN 13 6 - 20 mg/dL   Creatinine, Ser 1.61 0.44 - 1.00 mg/dL   Calcium 9.2 8.9 - 09.6 mg/dL   Total Protein 7.7 6.5 - 8.1 g/dL   Albumin 4.0 3.5 - 5.0 g/dL   AST 30 15 - 41 U/L   ALT 38 14 - 54 U/L   Alkaline Phosphatase 91 38 - 126 U/L   Total Bilirubin 0.8 0.3 - 1.2 mg/dL   GFR calc non Af Amer >60 >60 mL/min   GFR calc Af Amer >60 >60 mL/min   Anion gap 9 5 - 15  Urinalysis complete, with microscopic ([facility] only)  Result Value Ref Range   Color, Urine YELLOW YELLOW   APPearance CLEAR CLEAR   Glucose, UA 500 (A) NEGATIVE mg/dL   Bilirubin Urine NEGATIVE NEGATIVE   Ketones, ur NEGATIVE NEGATIVE mg/dL   Specific Gravity, Urine 1.015 1.005 - 1.030   Hgb urine dipstick NEGATIVE NEGATIVE   pH 6.0 5.0 - 8.0   Protein, ur NEGATIVE NEGATIVE mg/dL   Nitrite NEGATIVE NEGATIVE   Leukocytes, UA NEGATIVE NEGATIVE   RBC / HPF 0-5 0 - 5 RBC/hpf   WBC, UA 0-5 0 - 5 WBC/hpf   Bacteria, UA FEW (A) NONE SEEN   Squamous Epithelial / LPF 0-5 (A) NONE SEEN   Mucous PRESENT       Assessment & Plan:   Problem List Items Addressed This Visit      Cardiovascular and Mediastinum   Essential hypertension, benign    Well-controlled, try the DASH guidelines; limit black licorice, NSAIDs, decongestants        Digestive   Gastroesophageal reflux disease without esophagitis    Skip PPI one or two times a week; discussed risk of osteoporosis, anemia, etc        Endocrine   Type 2 diabetes mellitus, uncontrolled (HCC)    Foot exam today by MD; limit sodas and sugars and bread; see eye doctor yearly      Relevant Orders   Hemoglobin A1c   Microalbumin / creatinine urine ratio     Other   Obesity    Eat a little something for breakfast; drink 64 ounces of water daily; limit empty calories      Microcytosis    Hg C trait; 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      Follow up plan: Return in about 6 months (around 09/28/2016) for diabetes if A1c less than 7 (we'll see you back in 3 months if 7 or higher).  An after-visit summary was printed and given to the patient at check-out.  Please see the patient instructions which may contain other information and recommendations beyond what is mentioned above in the assessment and plan.  No orders of the defined types were placed in this encounter.   Orders Placed This Encounter  Procedures  . Lipid panel  . Hemoglobin A1c  . COMPLETE METABOLIC PANEL WITH  GFR  . CBC with Differential/Platelet  . Microalbumin / creatinine urine ratio   She says she'll get flu shot at work

## 2016-03-31 NOTE — Assessment & Plan Note (Signed)
Skip PPI one or two times a week; discussed risk of osteoporosis, anemia, etc

## 2016-03-31 NOTE — Assessment & Plan Note (Signed)
Check lipids, limit saturated fats 

## 2016-03-31 NOTE — Assessment & Plan Note (Signed)
Eat a little something for breakfast; drink 64 ounces of water daily; limit empty calories

## 2016-04-01 LAB — HEMOGLOBIN A1C
Hgb A1c MFr Bld: 9.2 % — ABNORMAL HIGH (ref <5.7)
MEAN PLASMA GLUCOSE: 217 mg/dL

## 2016-04-01 LAB — MICROALBUMIN / CREATININE URINE RATIO
CREATININE, URINE: 373 mg/dL — AB (ref 20–320)
MICROALB/CREAT RATIO: 4 ug/mg{creat} (ref <30)
Microalb, Ur: 1.6 mg/dL

## 2016-04-04 ENCOUNTER — Other Ambulatory Visit: Payer: Self-pay

## 2016-04-04 MED ORDER — LISINOPRIL-HYDROCHLOROTHIAZIDE 20-25 MG PO TABS: 1.0000 | ORAL_TABLET | Freq: Every day | ORAL | 1 refills | Status: DC

## 2016-04-04 MED ORDER — AMLODIPINE BESYLATE 5 MG PO TABS: 5.0000 mg | ORAL_TABLET | Freq: Every day | ORAL | 1 refills | Status: DC

## 2016-04-04 NOTE — Telephone Encounter (Signed)
rx approved after looking at cmp

## 2016-04-05 ENCOUNTER — Telehealth: Payer: Self-pay | Admitting: Family Medicine

## 2016-04-07 MED ORDER — EMPAGLIFLOZIN 25 MG PO TABS: 25.0000 mg | ORAL_TABLET | Freq: Every day | ORAL | 5 refills | Status: DC

## 2016-04-07 NOTE — Telephone Encounter (Signed)
I spoke with patient Her A1c had really gone up; continue current meds; she cannot tolerate metformin; she does not want insulin; will add Jardiance, savings card up front for her to pick up Monday; start walking 15 minutes once or twice a day; really work on weight loss HDL really dropped; likely related to high TG and high A1c, but walking and weight loss recommended; LDL same; TG up; start fish oil or krill oil BID F/u in 3 months Patient agrees

## 2016-04-18 ENCOUNTER — Other Ambulatory Visit: Payer: Self-pay

## 2016-04-19 MED ORDER — GLIPIZIDE ER 10 MG PO TB24: 10.0000 mg | ORAL_TABLET | Freq: Every morning | ORAL | 1 refills | Status: DC

## 2016-09-01 ENCOUNTER — Encounter: Payer: 59 | Admitting: Family Medicine

## 2016-09-08 ENCOUNTER — Ambulatory Visit
Admission: RE | Admit: 2016-09-08 | Discharge: 2016-09-08 | Disposition: A | Discharge status: Home / Self Care | Payer: 59 | Source: Ambulatory Visit | Attending: Family Medicine | Admitting: Family Medicine

## 2016-09-08 ENCOUNTER — Encounter: Payer: Self-pay | Admitting: Family Medicine

## 2016-09-08 ENCOUNTER — Ambulatory Visit (INDEPENDENT_AMBULATORY_CARE_PROVIDER_SITE_OTHER): Payer: 59 | Admitting: Family Medicine

## 2016-09-08 VITALS — BP 124/72 | HR 100 | Temp 98.1°F | Resp 16 | Ht 63.1 in | Wt 199.2 lb

## 2016-09-08 DIAGNOSIS — Z1211 Encounter for screening for malignant neoplasm of colon: Secondary | ICD-10-CM | POA: Insufficient documentation

## 2016-09-08 DIAGNOSIS — E6609 Other obesity due to excess calories: Secondary | ICD-10-CM

## 2016-09-08 DIAGNOSIS — Z6835 Body mass index (BMI) 35.0-35.9, adult: Secondary | ICD-10-CM

## 2016-09-08 DIAGNOSIS — E7800 Pure hypercholesterolemia, unspecified: Secondary | ICD-10-CM

## 2016-09-08 DIAGNOSIS — M25512 Pain in left shoulder: Secondary | ICD-10-CM | POA: Insufficient documentation

## 2016-09-08 DIAGNOSIS — G8929 Other chronic pain: Secondary | ICD-10-CM | POA: Diagnosis not present

## 2016-09-08 DIAGNOSIS — E1165 Type 2 diabetes mellitus with hyperglycemia: Secondary | ICD-10-CM | POA: Diagnosis not present

## 2016-09-08 DIAGNOSIS — I10 Essential (primary) hypertension: Secondary | ICD-10-CM

## 2016-09-08 DIAGNOSIS — Z0000 Encounter for general adult medical examination without abnormal findings: Secondary | ICD-10-CM | POA: Diagnosis not present

## 2016-09-08 DIAGNOSIS — Z1239 Encounter for other screening for malignant neoplasm of breast: Secondary | ICD-10-CM | POA: Insufficient documentation

## 2016-09-08 DIAGNOSIS — IMO0001 Reserved for inherently not codable concepts without codable children: Secondary | ICD-10-CM

## 2016-09-08 NOTE — Assessment & Plan Note (Signed)
Offered xray, accepted; offered PT, ortho referrals; pt will hold on those for now

## 2016-09-08 NOTE — Assessment & Plan Note (Signed)
Excellent control.   

## 2016-09-08 NOTE — Assessment & Plan Note (Signed)
Praise given for weight loss 

## 2016-09-08 NOTE — Patient Instructions (Addendum)
Start vitamin D3 and take 1,000 iu once a day Keep up the great job with weight loss and trying to eat healthier You can go across the street for your shoulder xray Let's get labs today If you have not heard anything from my staff in a week about any orders/referrals/studies from today, please contact us here to follow-up [phone] Try to limit saturated fats in your diet (bologna, hot dogs, barbeque, cheeseburgers, hamburgers, steak, bacon, sausage, cheese, etc.) and get more fresh fruits, vegetables, and whole grains Try to build up gradually to 150 minutes of walking each week Health Maintenance, Female Introduction Adopting a healthy lifestyle and getting preventive care can go a long way to promote health and wellness. Talk with your health care provider about what schedule of regular examinations is right for you. This is a good chance for you to check in with your provider about disease prevention and staying healthy. In between checkups, there are plenty of things you can do on your own. Experts have done a lot of research about which lifestyle changes and preventive measures are most likely to keep you healthy. Ask your health care provider for more information. Weight and diet Eat a healthy diet  Be sure to include plenty of vegetables, fruits, low-fat dairy products, and lean protein.  Do not eat a lot of foods high in solid fats, added sugars, or salt.  Get regular exercise. This is one of the most important things you can do for your health.  Most adults should exercise for at least 150 minutes each week. The exercise should increase your heart rate and make you sweat (moderate-intensity exercise).  Most adults should also do strengthening exercises at least twice a week. This is in addition to the moderate-intensity exercise. Maintain a healthy weight  Body mass index (BMI) is a measurement that can be used to identify possible weight problems. It estimates body fat based  on height and weight. Your health care provider can help determine your BMI and help you achieve or maintain a healthy weight.  For females 36 years of age and older:  A BMI below 18.5 is considered underweight.  A BMI of 18.5 to 24.9 is normal.  A BMI of 25 to 29.9 is considered overweight.  A BMI of 30 and above is considered obese. Watch levels of cholesterol and blood lipids  You should start having your blood tested for lipids and cholesterol at 55 years of age, then have this test every 5 years.  You may need to have your cholesterol levels checked more often if:  Your lipid or cholesterol levels are high.  You are older than 55 years of age.  You are at high risk for heart disease. Cancer screening Lung Cancer  Lung cancer screening is recommended for adults 48-36 years old who are at high risk for lung cancer because of a history of smoking.  A yearly low-dose CT scan of the lungs is recommended for people who:  Currently smoke.  Have quit within the past 15 years.  Have at least a 30-pack-year history of smoking. A pack year is smoking an average of one pack of cigarettes a day for 1 year.  Yearly screening should continue until it has been 15 years since you quit.  Yearly screening should stop if you develop a health problem that would prevent you from having lung cancer treatment. Breast Cancer  Practice breast self-awareness. This means understanding how your breasts normally appear and feel.  It also means doing regular breast self-exams. Let your health care provider know about any changes, no matter how small.  If you are in your 20s or 30s, you should have a clinical breast exam (CBE) by a health care provider every 1-3 years as part of a regular health exam.  If you are 8 or older, have a CBE every year. Also consider having a breast X-ray (mammogram) every year.  If you have a family history of breast cancer, talk to your health care provider about  genetic screening.  If you are at high risk for breast cancer, talk to your health care provider about having an MRI and a mammogram every year.  Breast cancer gene (BRCA) assessment is recommended for women who have family members with BRCA-related cancers. BRCA-related cancers include:  Breast.  Ovarian.  Tubal.  Peritoneal cancers.  Results of the assessment will determine the need for genetic counseling and BRCA1 and BRCA2 testing. Cervical Cancer  Your health care provider may recommend that you be screened regularly for cancer of the pelvic organs (ovaries, uterus, and vagina). This screening involves a pelvic examination, including checking for microscopic changes to the surface of your cervix (Pap test). You may be encouraged to have this screening done every 3 years, beginning at age 23.  For women ages 75-65, health care providers may recommend pelvic exams and Pap testing every 3 years, or they may recommend the Pap and pelvic exam, combined with testing for human papilloma virus (HPV), every 5 years. Some types of HPV increase your risk of cervical cancer. Testing for HPV may also be done on women of any age with unclear Pap test results.  Other health care providers may not recommend any screening for nonpregnant women who are considered low risk for pelvic cancer and who do not have symptoms. Ask your health care provider if a screening pelvic exam is right for you.  If you have had past treatment for cervical cancer or a condition that could lead to cancer, you need Pap tests and screening for cancer for at least 20 years after your treatment. If Pap tests have been discontinued, your risk factors (such as having a new sexual partner) need to be reassessed to determine if screening should resume. Some women have medical problems that increase the chance of getting cervical cancer. In these cases, your health care provider may recommend more frequent screening and Pap  tests. Colorectal Cancer  This type of cancer can be detected and often prevented.  Routine colorectal cancer screening usually begins at 55 years of age and continues through 55 years of age.  Your health care provider may recommend screening at an earlier age if you have risk factors for colon cancer.  Your health care provider may also recommend using home test kits to check for hidden blood in the stool.  A small camera at the end of a tube can be used to examine your colon directly (sigmoidoscopy or colonoscopy). This is done to check for the earliest forms of colorectal cancer.  Routine screening usually begins at age 85.  Direct examination of the colon should be repeated every 5-10 years through 55 years of age. However, you may need to be screened more often if early forms of precancerous polyps or small growths are found. Skin Cancer  Check your skin from head to toe regularly.  Tell your health care provider about any new moles or changes in moles, especially if there is a change in  a mole's shape or color.  Also tell your health care provider if you have a mole that is larger than the size of a pencil eraser.  Always use sunscreen. Apply sunscreen liberally and repeatedly throughout the day.  Protect yourself by wearing long sleeves, pants, a wide-brimmed hat, and sunglasses whenever you are outside. Heart disease, diabetes, and high blood pressure  High blood pressure causes heart disease and increases the risk of stroke. High blood pressure is more likely to develop in:  People who have blood pressure in the high end of the normal range (130-139/85-89 mm Hg).  People who are overweight or obese.  People who are African American.  If you are 80-13 years of age, have your blood pressure checked every 3-5 years. If you are 78 years of age or older, have your blood pressure checked every year. You should have your blood pressure measured twice-once when you are at a  hospital or clinic, and once when you are not at a hospital or clinic. Record the average of the two measurements. To check your blood pressure when you are not at a hospital or clinic, you can use:  An automated blood pressure machine at a pharmacy.  A home blood pressure monitor.  If you are between 66 years and 8 years old, ask your health care provider if you should take aspirin to prevent strokes.  Have regular diabetes screenings. This involves taking a blood sample to check your fasting blood sugar level.  If you are at a normal weight and have a low risk for diabetes, have this test once every three years after 55 years of age.  If you are overweight and have a high risk for diabetes, consider being tested at a younger age or more often. Preventing infection Hepatitis B  If you have a higher risk for hepatitis B, you should be screened for this virus. You are considered at high risk for hepatitis B if:  You were born in a country where hepatitis B is common. Ask your health care provider which countries are considered high risk.  Your parents were born in a high-risk country, and you have not been immunized against hepatitis B (hepatitis B vaccine).  You have HIV or AIDS.  You use needles to inject street drugs.  You live with someone who has hepatitis B.  You have had sex with someone who has hepatitis B.  You get hemodialysis treatment.  You take certain medicines for conditions, including cancer, organ transplantation, and autoimmune conditions. Hepatitis C  Blood testing is recommended for:  Everyone born from [birth year] through 1964.  Anyone with known risk factors for hepatitis C. Sexually transmitted infections (STIs)  You should be screened for sexually transmitted infections (STIs) including gonorrhea and chlamydia if:  You are sexually active and are younger than 55 years of age.  You are older than 55 years of age and your health care provider tells you  that you are at risk for this type of infection.  Your sexual activity has changed since you were last screened and you are at an increased risk for chlamydia or gonorrhea. Ask your health care provider if you are at risk.  If you do not have HIV, but are at risk, it may be recommended that you take a prescription medicine daily to prevent HIV infection. This is called pre-exposure prophylaxis (PrEP). You are considered at risk if:  You are sexually active and do not regularly use condoms or know the  HIV status of your partner(s).  You take drugs by injection.  You are sexually active with a partner who has HIV. Talk with your health care provider about whether you are at high risk of being infected with HIV. If you choose to begin PrEP, you should first be tested for HIV. You should then be tested every 3 months for as long as you are taking PrEP. Pregnancy  If you are premenopausal and you may become pregnant, ask your health care provider about preconception counseling.  If you may become pregnant, take 400 to 800 micrograms (mcg) of folic acid every day.  If you want to prevent pregnancy, talk to your health care provider about birth control (contraception). Osteoporosis and menopause  Osteoporosis is a disease in which the bones lose minerals and strength with aging. This can result in serious bone fractures. Your risk for osteoporosis can be identified using a bone density scan.  If you are 72 years of age or older, or if you are at risk for osteoporosis and fractures, ask your health care provider if you should be screened.  Ask your health care provider whether you should take a calcium or vitamin D supplement to lower your risk for osteoporosis.  Menopause may have certain physical symptoms and risks.  Hormone replacement therapy may reduce some of these symptoms and risks. Talk to your health care provider about whether hormone replacement therapy is right for you. Follow  these instructions at home:  Schedule regular health, dental, and eye exams.  Stay current with your immunizations.  Do not use any tobacco products including cigarettes, chewing tobacco, or electronic cigarettes.  If you are pregnant, do not drink alcohol.  If you are breastfeeding, limit how much and how often you drink alcohol.  Limit alcohol intake to no more than 1 drink per day for nonpregnant women. One drink equals 12 ounces of beer, 5 ounces of wine, or 1 ounces of hard liquor.  Do not use street drugs.  Do not share needles.  Ask your health care provider for help if you need support or information about quitting drugs.  Tell your health care provider if you often feel depressed.  Tell your health care provider if you have ever been abused or do not feel safe at home. This information is not intended to replace advice given to you by your health care provider. Make sure you discuss any questions you have with your health care provider. Document Released: 01/30/2011 Document Revised: 12/23/2015 Document Reviewed: 04/20/2015  2017 [employer]

## 2016-09-08 NOTE — Assessment & Plan Note (Signed)
screeening mammo ordered

## 2016-09-08 NOTE — Assessment & Plan Note (Signed)
USPSTF grade A and B recommendations reviewed with patient; age-appropriate recommendations, preventive care, screening tests, etc discussed and encouraged; healthy living encouraged; see AVS for patient education given to patient  

## 2016-09-08 NOTE — Assessment & Plan Note (Signed)
Check A1c and glucose today; so proud of patient for weight loss

## 2016-09-08 NOTE — Progress Notes (Signed)
Patient ID: Elizabeth Scott, female   DOB: [birth year], 56 y.o.   MRN: [id number]   Subjective:   Elizabeth Scott is a 56 y.o. female here for a complete physical exam  Interim issues since last visit: none  Type 2 diabetes; been trying to do well; drinking diet sodas; not much bread; no sores on the feet; had eye exam, but weather interferred; will reschedule eye exam  Urine microalbumin:creatinine ratio reviewed, ratio normal, creatinine mildly elevated; fair amount of meat  Previous elev ferritin, back to normal 10 months ago  Left shoulder pain; hurts to reach behind her; can reach up okay; takes NSAID just occasionally  USPSTF grade A and B recommendations Alcohol: rare Depression: Depression screen [facility] 2/9 09/08/2016 03/31/2016 08/30/2015  Decreased Interest 0 0 0  Down, Depressed, Hopeless 0 0 0  PHQ - 2 Score 0 0 0   Hypertension: controlled Obesity: down 11 pounds since new med, less appetite Tobacco use: never HIV, hep B, hep C: politely declined STD testing and prevention (chl/gon/syphilis): politely declined Lipids: check Glucose: check Colorectal cancer: 2015; five years Breast cancer: due BRCA gene screening: no breast or ovarian Intimate partner violence: no Cervical cancer screening: no cervix; reason for hyst was heavy bleeding Lung cancer: n/a Osteoporosis: n/a Fall prevention/vitamin D: suggested AAA: n/a Aspirin: taking Diet: trying to eat better Exercise: some walking; will try to improve Skin cancer: no worrisome moles   Past Medical History:  Diagnosis Date  . Allergy   . Chronic sinusitis   . Diabetes mellitus without complication ([place])   . GERD (gastroesophageal reflux disease)   . High cholesterol   . History of abnormal cervical Pap smear    Prior to Hysterectomy, normal paps for 2-3 years after Hysterectomy  . History of gestational diabetes   . Hypertension   . Hypopotassemia   . Microcytosis    Past Surgical History:   Procedure Laterality Date  . ABDOMINAL HYSTERECTOMY  2003   still has ovaries-for menorrhagia  . CESAREAN SECTION    . COLONOSCOPY  02/22/14   repeat 5 years  . ESOPHAGOGASTRODUODENOSCOPY  02/22/14  . TUBAL LIGATION     Family History  Problem Relation Age of Onset  . Diabetes Mother   . Heart disease Mother   . Hypertension Mother   . Stroke Mother   . Diabetes Father   . Hypertension Father   . Asthma Sister   . Diabetes Sister   . Hypertension Sister   . Lung disease Sister   . Hypertension Brother   . Heart disease Brother   . Stroke Brother   . Asthma Sister   . Diabetes Sister   . Hypertension Sister   . Cancer Neg Hx   . COPD Neg Hx    Social History  Substance Use Topics  . Smoking status: Never Smoker  . Smokeless tobacco: Never Used  . Alcohol use No   Review of Systems  Cardiovascular: Negative for chest pain.  Gastrointestinal: Negative for blood in stool.  Genitourinary: Negative for hematuria.    Objective:   Vitals:   09/08/16 1402  BP: 124/72  Pulse: 100  Resp: 16  Temp: 98.1 F (36.7 C)  TempSrc: Oral  SpO2: 96%  Weight: 199 lb 3.2 oz (90.4 kg)  Height: 5' 3.1" (1.603 m)   Body mass index is 35.17 kg/m. Wt Readings from Last 3 Encounters:  09/08/16 199 lb 3.2 oz (90.4 kg)  03/31/16 208 lb (94.3 kg)  03/27/16  210 lb (95.3 kg)   Physical Exam  Constitutional: She appears well-developed and well-nourished.  HENT:  Head: Normocephalic and atraumatic.  Right Ear: Hearing, tympanic membrane, external ear and ear canal normal.  Left Ear: Hearing, tympanic membrane, external ear and ear canal normal.  Eyes: Conjunctivae and EOM are normal. Right eye exhibits no hordeolum. Left eye exhibits no hordeolum. No scleral icterus.  Neck: Carotid bruit is not present. No thyromegaly present.  Cardiovascular: Normal rate, regular rhythm, S1 normal, S2 normal and normal heart sounds.   No extrasystoles are present.  Pulmonary/Chest: Effort normal  and breath sounds normal. No respiratory distress. Right breast exhibits no inverted nipple, no mass, no nipple discharge, no skin change and no tenderness. Left breast exhibits no inverted nipple, no mass, no nipple discharge, no skin change and no tenderness. Breasts are symmetrical.  Abdominal: Soft. Normal appearance and bowel sounds are normal. She exhibits no distension, no abdominal bruit, no pulsatile midline mass and no mass. There is no hepatosplenomegaly. There is no tenderness. No hernia.  Musculoskeletal: Normal range of motion. She exhibits no edema.  Lymphadenopathy:       Head (right side): No submandibular adenopathy present.       Head (left side): No submandibular adenopathy present.    She has no cervical adenopathy.    She has no axillary adenopathy.  Neurological: She is alert. She displays no tremor. No cranial nerve deficit. She exhibits normal muscle tone. Gait normal.  Reflex Scores:      Patellar reflexes are 2+ on the right side and 2+ on the left side. Skin: Skin is warm and dry. No bruising and no ecchymosis noted. No cyanosis. No pallor.  Psychiatric: Her speech is normal and behavior is normal. Thought content normal. Her mood appears not anxious. She does not exhibit a depressed mood.   Diabetic Foot Form - Detailed   Diabetic Foot Exam - detailed Diabetic Foot exam was performed with the following findings:  Yes 09/08/2016  1:03 PM  Visual Foot Exam completed.:  Yes  Are the toenails ingrown?:  No Normal Range of Motion:  Yes Pulse Foot Exam completed.:  Yes  Right Dorsalis Pedis:  Present Left Dorsalis Pedis:  Present  Sensory Foot Exam Completed.:  Yes Semmes-Weinstein Monofilament Test R Site 1-Great Toe:  Pos L Site 1-Great Toe:  Pos  R Site 4:  Pos L Site 4:  Pos  R Site 5:  Pos L Site 5:  Pos        Assessment/Plan:   Problem List Items Addressed This Visit      Cardiovascular and Mediastinum   Essential hypertension, benign    Excellent  control        Endocrine   Type 2 diabetes mellitus, uncontrolled (HCC)    Check A1c and glucose today; so proud of patient for weight loss      Relevant Orders   Hemoglobin A1c (Completed)     Other   Preventative health care    USPSTF grade A and B recommendations reviewed with patient; age-appropriate recommendations, preventive care, screening tests, etc discussed and encouraged; healthy living encouraged; see AVS for patient education given to patient       Relevant Orders   Lipid panel (Completed)   COMPLETE METABOLIC PANEL WITH GFR (Completed)   Obesity    Praise given for weight loss      Left shoulder pain    Offered xray, accepted; offered PT, ortho referrals; pt will hold  on those for now      Relevant Orders   DG Shoulder Left (Completed)   Hypercholesterolemia    Avoid saturated fats; check lipid today; goal HDL is over 50      Breast cancer screening    screeening mammo ordered      Relevant Orders   MM DIGITAL SCREENING BILATERAL       No orders of the defined types were placed in this encounter.  Orders Placed This Encounter  Procedures  . MM DIGITAL SCREENING BILATERAL    Order Specific Question:   Reason for Exam (SYMPTOM  OR DIAGNOSIS REQUIRED)    Answer:   screening    Order Specific Question:   Is the patient pregnant?    Answer:   No    Order Specific Question:   Preferred imaging location?    Answer:   [facility]  . DG Shoulder Left    Order Specific Question:   Reason for Exam (SYMPTOM  OR DIAGNOSIS REQUIRED)    Answer:   chronic left shoulder pain, painful to reach behind her    Order Specific Question:   Is patient pregnant?    Answer:   No    Order Specific Question:   Preferred imaging location?    Answer:   [facility]  . Hemoglobin A1c  . Lipid panel  . COMPLETE METABOLIC PANEL WITH GFR    Follow up plan: Return in about 1 year (around 09/08/2017) for complete physical; 3 months for diabetes,  cholesterol.  An After Visit Summary was printed and given to the patient.

## 2016-09-08 NOTE — Assessment & Plan Note (Signed)
Avoid saturated fats; check lipid today; goal HDL is over 50

## 2016-09-09 LAB — COMPLETE METABOLIC PANEL WITH GFR
ALBUMIN: 4.3 g/dL (ref 3.6–5.1)
ALK PHOS: 66 U/L (ref 33–130)
ALT: 13 U/L (ref 6–29)
AST: 14 U/L (ref 10–35)
BUN: 13 mg/dL (ref 7–25)
CALCIUM: 9.6 mg/dL (ref 8.6–10.4)
CO2: 27 mmol/L (ref 20–31)
CREATININE: 0.8 mg/dL (ref 0.50–1.05)
Chloride: 100 mmol/L (ref 98–110)
GFR, Est Non African American: 84 mL/min (ref >=60)
Glucose, Bld: 94 mg/dL (ref 65–99)
Potassium: 3.8 mmol/L (ref 3.5–5.3)
Sodium: 137 mmol/L (ref 135–146)
Total Bilirubin: 0.9 mg/dL (ref 0.2–1.2)
Total Protein: 7.1 g/dL (ref 6.1–8.1)

## 2016-09-09 LAB — LIPID PANEL
CHOL/HDL RATIO: 3.8 ratio (ref <5.0)
CHOLESTEROL: 144 mg/dL (ref <200)
HDL: 38 mg/dL — ABNORMAL LOW (ref >50)
LDL Cholesterol: 82 mg/dL (ref <100)
TRIGLYCERIDES: 121 mg/dL (ref <150)
VLDL: 24 mg/dL (ref <30)

## 2016-09-09 LAB — HEMOGLOBIN A1C
Hgb A1c MFr Bld: 6.9 % — ABNORMAL HIGH (ref <5.7)
MEAN PLASMA GLUCOSE: 151 mg/dL

## 2016-09-28 ENCOUNTER — Other Ambulatory Visit: Payer: Self-pay | Admitting: Family Medicine

## 2016-10-21 ENCOUNTER — Other Ambulatory Visit: Payer: Self-pay | Admitting: Family Medicine

## 2016-10-30 ENCOUNTER — Other Ambulatory Visit: Payer: Self-pay | Admitting: Family Medicine

## 2016-10-30 NOTE — Telephone Encounter (Signed)
Last cmp reviewed

## 2016-11-01 ENCOUNTER — Other Ambulatory Visit: Payer: Self-pay

## 2016-11-01 MED ORDER — ATORVASTATIN CALCIUM 40 MG PO TABS: 40.0000 mg | ORAL_TABLET | Freq: Every day | ORAL | 1 refills | Status: DC

## 2016-11-01 NOTE — Telephone Encounter (Signed)
SGPT and lipids from Feb 2018 reviewed Rx approved

## 2016-11-07 ENCOUNTER — Other Ambulatory Visit: Payer: Self-pay

## 2016-11-07 MED ORDER — LINAGLIPTIN 5 MG PO TABS: 5.0000 mg | ORAL_TABLET | Freq: Every day | ORAL | 3 refills | Status: DC

## 2016-11-08 ENCOUNTER — Telehealth: Payer: Self-pay

## 2016-11-08 NOTE — Telephone Encounter (Signed)
Pt Atorvastatin was sent to the wrong pharmacy ( [facility]). Called [facility] and ask them to have it transferred over to them. Pharm tech has it transferred; Dosage, route, frequency , amount and refill was verified.

## 2016-11-14 ENCOUNTER — Telehealth: Payer: Self-pay

## 2016-11-14 MED ORDER — FLUCONAZOLE 150 MG PO TABS: 150.0000 mg | ORAL_TABLET | Freq: Once | ORAL | 0 refills | Status: AC

## 2016-11-14 NOTE — Telephone Encounter (Signed)
Pt states she has a yeast infection and she tried OVC three day Vaginal antifungal medication and its not working. pt asking for a Rx instead since she's having foul smell and white clunky discharge.

## 2016-11-14 NOTE — Telephone Encounter (Signed)
Sure.  Rx sent.

## 2016-11-14 NOTE — Telephone Encounter (Signed)
Pt.notified

## 2016-11-19 ENCOUNTER — Other Ambulatory Visit: Payer: Self-pay | Admitting: Family Medicine

## 2016-11-20 ENCOUNTER — Other Ambulatory Visit: Payer: Self-pay

## 2016-11-20 MED ORDER — ATORVASTATIN CALCIUM 40 MG PO TABS: 40.0000 mg | ORAL_TABLET | Freq: Every day | ORAL | 1 refills | Status: DC

## 2016-11-20 NOTE — Telephone Encounter (Signed)
Last lipids and sgpt reviewed; Rx approved 

## 2016-11-27 ENCOUNTER — Other Ambulatory Visit: Payer: Self-pay

## 2016-11-27 MED ORDER — EMPAGLIFLOZIN 25 MG PO TABS: 25.0000 mg | ORAL_TABLET | Freq: Every day | ORAL | 5 refills | Status: DC

## 2016-12-29 ENCOUNTER — Encounter: Payer: Self-pay | Admitting: Family Medicine

## 2016-12-29 ENCOUNTER — Ambulatory Visit (INDEPENDENT_AMBULATORY_CARE_PROVIDER_SITE_OTHER): Payer: 59 | Admitting: Family Medicine

## 2016-12-29 VITALS — BP 114/72 | HR 93 | Temp 98.0°F | Resp 14 | Wt 202.3 lb

## 2016-12-29 DIAGNOSIS — Z1231 Encounter for screening mammogram for malignant neoplasm of breast: Secondary | ICD-10-CM

## 2016-12-29 DIAGNOSIS — R8290 Unspecified abnormal findings in urine: Secondary | ICD-10-CM

## 2016-12-29 DIAGNOSIS — N898 Other specified noninflammatory disorders of vagina: Secondary | ICD-10-CM

## 2016-12-29 DIAGNOSIS — E119 Type 2 diabetes mellitus without complications: Secondary | ICD-10-CM | POA: Diagnosis not present

## 2016-12-29 DIAGNOSIS — Z1239 Encounter for other screening for malignant neoplasm of breast: Secondary | ICD-10-CM

## 2016-12-29 LAB — POCT URINALYSIS DIPSTICK
Bilirubin, UA: NEGATIVE
Blood, UA: NEGATIVE
Glucose, UA: POSITIVE
Ketones, UA: NEGATIVE
Leukocytes, UA: NEGATIVE
Nitrite, UA: NEGATIVE
Protein, UA: NEGATIVE
Spec Grav, UA: 1.02
Urobilinogen, UA: 0.2 U/dL
pH, UA: 6

## 2016-12-29 NOTE — Assessment & Plan Note (Signed)
Patient wishes to continue the Jardiance despite possibility that it's increasing risk of yeast infections and/or urinary tract infections; foot exam done today

## 2016-12-29 NOTE — Patient Instructions (Addendum)
  Please do see your eye doctor regularly, and have your eyes examined every year (or more often per his or her recommendation) Check your feet every night and let me know right away of any sores, infections, numbness, etc. Try to limit sweets, white bread, white rice, white potatoes It is okay with me for you to not check your fingerstick blood sugars (per [employer] Best Practices), unless you are interested and feel it would be helpful for you Check out the information at familydoctor.org entitled "Nutrition for Weight Loss: What You Need to Know about Fad Diets" Try to lose between 1-2 pounds per week by taking in fewer calories and burning off more calories You can succeed by limiting portions, limiting foods dense in calories and fat, becoming more active, and drinking 8 glasses of water a day (64 ounces) Don't skip meals, especially breakfast, as skipping meals may alter your metabolism Do not use over-the-counter weight loss pills or gimmicks that claim rapid weight loss A healthy BMI (or body mass index) is between 18.5 and 24.9 You can calculate your ideal BMI at the NIH website [employer].huhttp://www.nhlbi.nih.gov/health/educational/lose_wt/BMI/bmicalc.htm I'll contact you about the lab results

## 2016-12-29 NOTE — Progress Notes (Signed)
BP 114/72   Pulse 93   Temp 98 F (36.7 C) (Oral)   Resp 14   Wt 202 lb 4.8 oz (91.8 kg)   SpO2 96%   BMI 35.72 kg/m    Subjective:    Patient ID: Elizabeth Scott, female    DOB: 01/30/1961, 56 y.o.   MRN: [id number]  HPI: Elizabeth Scott is a 56 y.o. female  Chief Complaint  Patient presents with  . Vaginitis    recurrent,itching has tried OTC meds   HPI Patient is here for an acute visit Keeps having itching in the vaginal area She tried monistat Not really having disharge When she urinates, sometimes dark and odor, no dysuria No fevers No lower back pain, some lower abd pressure  She has type 2 diabetes and is on Jardiance Lab Results  Component Value Date   HGBA1C 6.9 (H) 09/08/2016  She is not checking FSBS with my blessing Not usually having dry mouth; just every once in a while, a little thirsty  [facility] 12/29/2016 09/08/2016 03/31/2016 08/30/2015  Decreased Interest 0 0 0 0  Down, Depressed, Hopeless 0 0 0 0  PHQ - 2 Score 0 0 0 0    Relevant past medical, surgical, family and social history reviewed Past Medical History:  Diagnosis Date  . Allergy   . Chronic sinusitis   . Diabetes mellitus without complication (HCC)   . GERD (gastroesophageal reflux disease)   . High cholesterol   . History of abnormal cervical Pap smear    Prior to Hysterectomy, normal paps for 2-3 years after Hysterectomy  . History of gestational diabetes   . Hypertension   . Hypopotassemia   . Microcytosis    Past Surgical History:  Procedure Laterality Date  . ABDOMINAL HYSTERECTOMY  2003   still has ovaries-for menorrhagia  . CESAREAN SECTION    . COLONOSCOPY  02/22/14   repeat 5 years  . ESOPHAGOGASTRODUODENOSCOPY  02/22/14  . TUBAL LIGATION     Family History  Problem Relation Age of Onset  . Diabetes Mother   . Heart disease Mother   . Hypertension Mother   . Stroke Mother   . Diabetes Father   . Hypertension Father   . Asthma  Sister   . Diabetes Sister   . Hypertension Sister   . Lung disease Sister   . Hypertension Brother   . Heart disease Brother   . Stroke Brother   . Asthma Sister   . Diabetes Sister   . Hypertension Sister   . Cancer Neg Hx   . COPD Neg Hx    Social History   Social History  . Marital status: Married    Spouse name: N/A  . Number of children: N/A  . Years of education: N/A   Occupational History  . Not on file.   Social History Main Topics  . Smoking status: Never Smoker  . Smokeless tobacco: Never Used  . Alcohol use No  . Drug use: No  . Sexual activity: Not on file   Other Topics Concern  . Not on file   Social History Narrative  . No narrative on file   Interim medical history since last visit reviewed. Allergies and medications reviewed  Review of Systems Per HPI unless specifically indicated above     Objective:    BP 114/72   Pulse 93   Temp 98 F (36.7 C) (Oral)   Resp 14   Wt 202  lb 4.8 oz (91.8 kg)   SpO2 96%   BMI 35.72 kg/m   Wt Readings from Last 3 Encounters:  12/29/16 202 lb 4.8 oz (91.8 kg)  09/08/16 199 lb 3.2 oz (90.4 kg)  03/31/16 208 lb (94.3 kg)    Physical Exam  Constitutional: She appears well-developed and well-nourished. No distress.  HENT:  Mouth/Throat: Mucous membranes are normal.  Eyes: EOM are normal. No scleral icterus.  Cardiovascular: Normal rate and regular rhythm.   Pulmonary/Chest: Effort normal and breath sounds normal.  Abdominal: Soft. She exhibits no distension and no mass. There is no tenderness. There is no guarding and no CVA tenderness.  Genitourinary: There is no rash or lesion on the right labia. There is no rash or lesion on the left labia. No erythema or bleeding in the vagina. Vaginal discharge (thin watery discharge) found.  Neurological: She is alert.  Psychiatric: She has a normal mood and affect. Her mood appears not anxious.   Diabetic Foot Form - Detailed   Diabetic Foot Exam -  detailed Diabetic Foot exam was performed with the following findings:  Yes 12/29/2016  2:43 PM  Visual Foot Exam completed.:  Yes  Are the toenails ingrown?:  No Normal Range of Motion:  Yes Pulse Foot Exam completed.:  Yes  Right Dorsalis Pedis:  Present Left Dorsalis Pedis:  Present  Sensory Foot Exam Completed.:  Yes Swelling:  No Semmes-Weinstein Monofilament Test R Site 1-Great Toe:  Pos L Site 1-Great Toe:  Pos  R Site 4:  Pos L Site 4:  Pos  R Site 5:  Pos L Site 5:  Pos       Results for orders placed or performed in visit on 09/08/16  Hemoglobin A1c  Result Value Ref Range   Hgb A1c MFr Bld 6.9 (H) <5.7 %   Mean Plasma Glucose 151 mg/dL  Lipid panel  Result Value Ref Range   Cholesterol 144 <200 mg/dL   Triglycerides 161 <096 mg/dL   HDL 38 (L) >04 mg/dL   Total CHOL/HDL Ratio 3.8 <5.0 Ratio   VLDL 24 <30 mg/dL   LDL Cholesterol 82 <540 mg/dL  COMPLETE METABOLIC PANEL WITH GFR  Result Value Ref Range   Sodium 137 135 - 146 mmol/L   Potassium 3.8 3.5 - 5.3 mmol/L   Chloride 100 98 - 110 mmol/L   CO2 27 20 - 31 mmol/L   Glucose, Bld 94 65 - 99 mg/dL   BUN 13 7 - 25 mg/dL   Creat 9.81 1.91 - 4.78 mg/dL   Total Bilirubin 0.9 0.2 - 1.2 mg/dL   Alkaline Phosphatase 66 33 - 130 U/L   AST 14 10 - 35 U/L   ALT 13 6 - 29 U/L   Total Protein 7.1 6.1 - 8.1 g/dL   Albumin 4.3 3.6 - 5.1 g/dL   Calcium 9.6 8.6 - 29.5 mg/dL   GFR, Est African American >89 >=60 mL/min   GFR, Est Non African American 84 >=60 mL/min      Assessment & Plan:   Problem List Items Addressed This Visit      Endocrine   DM type 2 (diabetes mellitus, type 2) (HCC)    Patient wishes to continue the Jardiance despite possibility that it's increasing risk of yeast infections and/or urinary tract infections; foot exam done today       Other Visit Diagnoses    Vaginal irritation    -  Primary   Relevant Orders   WET  PREP BY MOLECULAR PROBE   Screening for breast cancer       Relevant Orders    MM Digital Screening   Abnormal urine odor       Relevant Orders   POCT Urinalysis Dipstick      Follow up plan: No Follow-up on file.  An after-visit summary was printed and given to the patient at check-out.  Please see the patient instructions which may contain other information and recommendations beyond what is mentioned above in the assessment and plan.  No orders of the defined types were placed in this encounter.   Orders Placed This Encounter  Procedures  . WET PREP BY MOLECULAR PROBE  . MM Digital Screening  . POCT Urinalysis Dipstick

## 2016-12-30 ENCOUNTER — Telehealth: Payer: Self-pay | Admitting: Family Medicine

## 2016-12-30 LAB — WET PREP BY MOLECULAR PROBE
CANDIDA SPECIES: NOT DETECTED
Gardnerella vaginalis: NOT DETECTED
Trichomonas vaginosis: NOT DETECTED

## 2016-12-30 MED ORDER — FLUCONAZOLE 150 MG PO TABS: 150.0000 mg | ORAL_TABLET | Freq: Once | ORAL | 0 refills | Status: AC

## 2016-12-30 NOTE — Telephone Encounter (Signed)
I spoke with patient about labs; treat patient, not test result; will try diflucan x 1; no statin x 3 days

## 2017-01-16 ENCOUNTER — Other Ambulatory Visit: Payer: Self-pay | Admitting: Family Medicine

## 2017-01-16 ENCOUNTER — Telehealth: Payer: Self-pay

## 2017-01-16 NOTE — Telephone Encounter (Signed)
Received a letter from [employer] regarding pt Jardiance 23 mg tablet. It states the pt may be non- compliant to the prescribing dosing regimen. Spoke with the pt and the only complications she reported to me are yeast infections reoccurring while on this medication. Pt confirmed the route, frequency and instructions on taking the medication. Pt denied any other issues while taking Jardiance.

## 2017-02-07 ENCOUNTER — Telehealth: Payer: Self-pay | Admitting: Family Medicine

## 2017-02-07 NOTE — Telephone Encounter (Signed)
Please contact patient I received a note from her insurance company, concerned that she is taking pantoprazole It [facility] If she does not have an ulcer or Barrett's esophagus, we would prefer that she switch to a safer medicine If she agrees, taper off of pantoprazole (skip to every other day for a few weeks, then every third day for a few weeks, then stop) Start ranitidine 150 mg tablets one by mouth twice a day, #60 + 5 refills (please enter and send if she agrees) If not, then appt to discuss please

## 2017-02-07 NOTE — Telephone Encounter (Signed)
Patient notified, states she has been out of the pantoprazole for the last 2 weeks anyway.  She will try Ranitidine please send in.

## 2017-02-08 MED ORDER — RANITIDINE HCL 150 MG PO TABS: 150.0000 mg | ORAL_TABLET | Freq: Two times a day (BID) | ORAL | 3 refills | Status: DC

## 2017-03-09 ENCOUNTER — Encounter: Payer: Self-pay | Admitting: Family Medicine

## 2017-03-09 ENCOUNTER — Ambulatory Visit: Payer: 59 | Admitting: Family Medicine

## 2017-03-09 ENCOUNTER — Ambulatory Visit (INDEPENDENT_AMBULATORY_CARE_PROVIDER_SITE_OTHER): Payer: 59 | Admitting: Family Medicine

## 2017-03-09 DIAGNOSIS — E7800 Pure hypercholesterolemia, unspecified: Secondary | ICD-10-CM | POA: Diagnosis not present

## 2017-03-09 DIAGNOSIS — I10 Essential (primary) hypertension: Secondary | ICD-10-CM | POA: Diagnosis not present

## 2017-03-09 DIAGNOSIS — Z6835 Body mass index (BMI) 35.0-35.9, adult: Secondary | ICD-10-CM | POA: Diagnosis not present

## 2017-03-09 DIAGNOSIS — Z5181 Encounter for therapeutic drug level monitoring: Secondary | ICD-10-CM

## 2017-03-09 DIAGNOSIS — E119 Type 2 diabetes mellitus without complications: Secondary | ICD-10-CM | POA: Diagnosis not present

## 2017-03-09 DIAGNOSIS — D582 Other hemoglobinopathies: Secondary | ICD-10-CM | POA: Diagnosis not present

## 2017-03-09 LAB — COMPLETE METABOLIC PANEL WITH GFR
ALT: 14 U/L (ref 6–29)
AST: 12 U/L (ref 10–35)
Albumin: 4.1 g/dL (ref 3.6–5.1)
Alkaline Phosphatase: 76 U/L (ref 33–130)
BUN: 13 mg/dL (ref 7–25)
CHLORIDE: 100 mmol/L (ref 98–110)
CO2: 27 mmol/L (ref 20–32)
Calcium: 9.4 mg/dL (ref 8.6–10.4)
Creat: 0.77 mg/dL (ref 0.50–1.05)
GFR, EST NON AFRICAN AMERICAN: 87 mL/min (ref >=60)
GLUCOSE: 153 mg/dL — AB (ref 65–99)
POTASSIUM: 3.5 mmol/L (ref 3.5–5.3)
SODIUM: 139 mmol/L (ref 135–146)
TOTAL PROTEIN: 6.8 g/dL (ref 6.1–8.1)
Total Bilirubin: 1.1 mg/dL (ref 0.2–1.2)

## 2017-03-09 LAB — LIPID PANEL
CHOL/HDL RATIO: 3.4 ratio (ref <5.0)
CHOLESTEROL: 151 mg/dL (ref <200)
HDL: 44 mg/dL — ABNORMAL LOW (ref >50)
LDL Cholesterol: 85 mg/dL (ref <100)
TRIGLYCERIDES: 110 mg/dL (ref <150)
VLDL: 22 mg/dL (ref <30)

## 2017-03-09 MED ORDER — AMLODIPINE BESYLATE 2.5 MG PO TABS: 2.5000 mg | ORAL_TABLET | Freq: Every day | ORAL | 5 refills | Status: DC

## 2017-03-09 NOTE — Assessment & Plan Note (Signed)
Check lipids, goal TG less than 150, goal HDL over 50, goal LDL less than 100

## 2017-03-09 NOTE — Assessment & Plan Note (Signed)
Monitor SGPT on statin, Cr and K+ on BP med

## 2017-03-09 NOTE — Progress Notes (Signed)
BP 116/62   Pulse 84   Temp 98.1 F (36.7 C) (Oral)   Resp 14   Wt 201 lb 8 oz (91.4 kg)   SpO2 97%   BMI 35.58 kg/m    Subjective:    Patient ID: Elizabeth Scott, female    DOB: 04/28/1961, 56 y.o.   MRN: [id number]  HPI: Elizabeth Gloweresa Elaine Henner is a 56 y.o. female  Chief Complaint  Patient presents with  . Medication Refill  . Follow-up    2 month   . Immunizations    PPSV or PCV ?    HPI Patient is here for f/u She has type 2 diabetes; not checking sugars with my blessing; sometimes not sure if too low or too high; dry mouth, but no blurred vision She is allergic to metformin; cannot take it Insurance has been slow to approve the Franceradjenta; reviewed alternatives which all include metformin  High cholesterol; tries to avoid sausage; rare bologna; no eggs; eats cheese on salads, borderline lactose intolerant, limits dairy product Lab Results  Component Value Date   CHOL 144 09/08/2016   HDL 38 (L) 09/08/2016   LDLCALC 82 09/08/2016   TRIG 121 09/08/2016   CHOLHDL 3.8 09/08/2016   She has high blood pressure which is well-controlled today  She is obese and has not really had any success with weight loss  She asked about the pneumonia vaccine; we reviewed her last PPSV-23 which is UTD; she is not old enough for the PCV-13 per current guidelines  [facility] screen [facility] 03/09/2017 12/29/2016 09/08/2016 03/31/2016 08/30/2015  Decreased Interest 0 0 0 0 0  Down, Depressed, Hopeless 0 0 0 0 0  PHQ - 2 Score 0 0 0 0 0   Relevant past medical, surgical, family and social history reviewed Past Medical History:  Diagnosis Date  . Allergy   . Chronic sinusitis   . Diabetes mellitus without complication (HCC)   . GERD (gastroesophageal reflux disease)   . High cholesterol   . History of abnormal cervical Pap smear    Prior to Hysterectomy, normal paps for 2-3 years after Hysterectomy  . History of gestational diabetes   . Hypertension   . Hypopotassemia   .  Microcytosis    Past Surgical History:  Procedure Laterality Date  . ABDOMINAL HYSTERECTOMY  2003   still has ovaries-for menorrhagia  . CESAREAN SECTION    . COLONOSCOPY  02/22/14   repeat 5 years  . ESOPHAGOGASTRODUODENOSCOPY  02/22/14  . TUBAL LIGATION     Family History  Problem Relation Age of Onset  . Diabetes Mother   . Heart disease Mother   . Hypertension Mother   . Stroke Mother   . Diabetes Father   . Hypertension Father   . Asthma Sister   . Diabetes Sister   . Hypertension Sister   . Lung disease Sister   . Hypertension Brother   . Heart disease Brother   . Stroke Brother   . Asthma Sister   . Diabetes Sister   . Hypertension Sister   . Congestive Heart Failure Maternal Grandmother   . Cancer Paternal Grandfather        stomach cancer  . COPD Neg Hx    Social History   Social History  . Marital status: Married    Spouse name: N/A  . Number of children: N/A  . Years of education: N/A   Occupational History  . Not on file.   Social History Main  Topics  . Smoking status: Never Smoker  . Smokeless tobacco: Never Used  . Alcohol use No  . Drug use: No  . Sexual activity: Yes   Other Topics Concern  . Not on file   Social History Narrative  . No narrative on file   Interim medical history since last visit reviewed. Allergies and medications reviewed  Review of Systems Per HPI unless specifically indicated above     Objective:    BP 116/62   Pulse 84   Temp 98.1 F (36.7 C) (Oral)   Resp 14   Wt 201 lb 8 oz (91.4 kg)   SpO2 97%   BMI 35.58 kg/m   Wt Readings from Last 3 Encounters:  03/09/17 201 lb 8 oz (91.4 kg)  12/29/16 202 lb 4.8 oz (91.8 kg)  09/08/16 199 lb 3.2 oz (90.4 kg)    Physical Exam  Constitutional: She appears well-developed and well-nourished.  HENT:  Head: Normocephalic and atraumatic.  Right Ear: Hearing, tympanic membrane, external ear and ear canal normal.  Left Ear: Hearing, tympanic membrane, external ear  and ear canal normal.  Eyes: EOM are normal. No scleral icterus.  Neck: No thyromegaly present.  Cardiovascular: Normal rate, regular rhythm, S1 normal, S2 normal and normal heart sounds.   No extrasystoles are present.  Pulmonary/Chest: Effort normal and breath sounds normal. No respiratory distress.  Abdominal: Soft. Normal appearance and bowel sounds are normal. She exhibits no distension and no mass.  Musculoskeletal: She exhibits no edema.  Neurological: She is alert. She displays no tremor. No cranial nerve deficit. She exhibits normal muscle tone. Gait normal.  Reflex Scores:      Patellar reflexes are 2+ on the right side and 2+ on the left side. Skin: Skin is warm and dry. No bruising and no ecchymosis noted. No pallor.  Psychiatric: Her speech is normal and behavior is normal. Thought content normal. Her mood appears not anxious. She does not exhibit a depressed mood.   Diabetic Foot Form - Detailed   Diabetic Foot Exam - detailed Diabetic Foot exam was performed with the following findings:  Yes 03/09/2017  3:37 PM  Visual Foot Exam completed.:  Yes  Pulse Foot Exam completed.:  Yes  Right Dorsalis Pedis:  Present Left Dorsalis Pedis:  Present  Sensory Foot Exam Completed.:  Yes Semmes-Weinstein Monofilament Test R Site 1-Great Toe:  Pos L Site 1-Great Toe:  Pos        Results for orders placed or performed in visit on 12/29/16  WET PREP BY MOLECULAR PROBE  Result Value Ref Range   Candida species NOT DETECTED NOT DETECTED   Trichomonas vaginosis NOT DETECTED NOT DETECTED   Gardnerella vaginalis NOT DETECTED NOT DETECTED  POCT Urinalysis Dipstick  Result Value Ref Range   Color, UA yellow    Clarity, UA clear    Glucose, UA positive    Bilirubin, UA neg    Ketones, UA neg    Spec Grav, UA 1.020 1.010 - 1.025   Blood, UA neg    pH, UA 6.0 5.0 - 8.0   Protein, UA neg    Urobilinogen, UA 0.2 0.2 or 1.0 E.U./dL   Nitrite, UA neg    Leukocytes, UA Negative Negative        Assessment & Plan:   Problem List Items Addressed This Visit      Cardiovascular and Mediastinum   Essential hypertension, benign    Decrease the CCB, monitor and stop if still controlled  Relevant Medications   amLODipine (NORVASC) 2.5 MG tablet     Endocrine   DM type 2 (diabetes mellitus, type 2) (HCC)    Foot exam by MD; check A1c today; try to get Tradjenta covered by insurance; working on weight loss; eye exam UTD      Relevant Orders   Hemoglobin A1c   Lipid panel   Microalbumin / creatinine urine ratio     Other   Obesity    Trying to lose weight      Hypercholesterolemia    Check lipids, goal TG less than 150, goal HDL over 50, goal LDL less than 100      Relevant Medications   amLODipine (NORVASC) 2.5 MG tablet   Other Relevant Orders   Lipid panel   Hemoglobin C trait (HCC)    Stable, saw hematologist      Encounter for medication monitoring    Monitor SGPT on statin, Cr and K+ on BP med      Relevant Orders   COMPLETE METABOLIC PANEL WITH GFR       Follow up plan: Return in about 6 months (around 09/09/2017) for twenty minute follow-up with fasting labs.  An after-visit summary was printed and given to the patient at check-out.  Please see the patient instructions which may contain other information and recommendations beyond what is mentioned above in the assessment and plan.  Meds ordered this encounter  Medications  . amLODipine (NORVASC) 2.5 MG tablet    Sig: Take 1 tablet (2.5 mg total) by mouth daily.    Dispense:  30 tablet    Refill:  5    Cancel the 5 mg strength    Orders Placed This Encounter  Procedures  . COMPLETE METABOLIC PANEL WITH GFR  . Hemoglobin A1c  . Lipid panel  . Microalbumin / creatinine urine ratio

## 2017-03-09 NOTE — Assessment & Plan Note (Signed)
Trying to lose weight. 

## 2017-03-09 NOTE — Assessment & Plan Note (Signed)
Foot exam by MD; check A1c today; try to get Tradjenta covered by insurance; working on weight loss; eye exam UTD

## 2017-03-09 NOTE — Patient Instructions (Addendum)
Your next pneumonia vaccine will be the PCV-13 at age 56, followed by the PPSV-23 at age 55 (per current guidelines) Decrease the amlodipine from 5 mg daily to 2.5 mg daily Monitor blood pressure and stop amlodipine altogether if able to keep top BP number under 130 Try to limit saturated fats in your diet (bologna, hot dogs, barbeque, cheeseburgers, hamburgers, steak, bacon, sausage, cheese, etc.) and get more fresh fruits, vegetables, and whole grains Please do see your eye doctor regularly, and have your eyes examined every year (or more often per his or her recommendation) Check your feet every night and let me know right away of any sores, infections, numbness, etc. Try to limit sweets, white bread, white rice, white potatoes It is okay with me for you to not check your fingerstick blood sugars (per [employer] Best Practices), unless you are interested and feel it would be helpful for you

## 2017-03-09 NOTE — Assessment & Plan Note (Signed)
Decrease the CCB, monitor and stop if still controlled

## 2017-03-09 NOTE — Assessment & Plan Note (Signed)
Stable, saw hematologist

## 2017-03-10 LAB — MICROALBUMIN / CREATININE URINE RATIO
Creatinine, Urine: 69 mg/dL (ref 20–320)
MICROALB UR: 0.4 mg/dL
Microalb Creat Ratio: 6 mcg/mg creat (ref <30)

## 2017-03-10 LAB — HEMOGLOBIN A1C
Hgb A1c MFr Bld: 6.8 % — ABNORMAL HIGH (ref <5.7)
Mean Plasma Glucose: 148 mg/dL

## 2017-03-17 ENCOUNTER — Other Ambulatory Visit: Payer: Self-pay | Admitting: Family Medicine

## 2017-03-19 NOTE — Telephone Encounter (Signed)
Please verify glipizide Rx with patient or pharmacy I prescribed a 6 month supply on 11/19/16 so she shouldn't run out for at least another two months If instructions have changed or she's taking it BID, please update and back to me for Rx Thank you

## 2017-03-20 NOTE — Telephone Encounter (Signed)
Spoke with pt she states she transferred pharmacy and she was trying to get the refill that she has left at [facility]  But it didn't go through. I will have to transfer the [facility]ide from [facility] to [facility] in [place].

## 2017-03-29 ENCOUNTER — Telehealth: Payer: Self-pay | Admitting: Family Medicine

## 2017-03-29 NOTE — Telephone Encounter (Signed)
Please follow-up with patient about the outstanding mammogram order We strongly encourage breast cancer screening Breast cancer is the 2nd leading cause of death in American women from cancer (#1 in Hispanic women in this country), and early detection saves lives Please offer other health maintenance services which are marked red Thank you

## 2017-03-30 NOTE — Telephone Encounter (Signed)
Patient notified

## 2017-06-16 ENCOUNTER — Other Ambulatory Visit: Payer: Self-pay | Admitting: Family Medicine

## 2017-06-16 NOTE — Telephone Encounter (Signed)
Last Cr and K+ reviewed; Rx approved 

## 2017-06-24 ENCOUNTER — Other Ambulatory Visit: Payer: Self-pay | Admitting: Family Medicine

## 2017-06-25 MED ORDER — EMPAGLIFLOZIN 25 MG PO TABS: 25.0000 mg | ORAL_TABLET | Freq: Every day | ORAL | 5 refills | Status: DC

## 2017-06-26 ENCOUNTER — Other Ambulatory Visit: Payer: Self-pay | Admitting: Family Medicine

## 2017-07-17 ENCOUNTER — Telehealth: Payer: Self-pay | Admitting: Family Medicine

## 2017-07-17 NOTE — Telephone Encounter (Signed)
Please encourage patient to get her screening mammogram done as soon as possible Ask her to go ahead and get this scheduled, after Christmas is fine, but we'd really love her to get this done ASAP She'll be glad she did and can cross that off of her "to do" list when she's finished Thank you 

## 2017-07-19 NOTE — Telephone Encounter (Signed)
Called pt, no answer. Unable to leave message as no voicemail is set up. Letter mailed.

## 2017-08-16 ENCOUNTER — Other Ambulatory Visit: Payer: Self-pay | Admitting: Family Medicine

## 2017-08-16 NOTE — Telephone Encounter (Signed)
[facility] requesting Jardiance Rx 6 month supply of Jardiance prescribed 06/25/17; should not be needed Please resolve with pharmacy

## 2017-08-16 NOTE — Telephone Encounter (Signed)
Left voicemail with pharmacy 

## 2017-08-22 ENCOUNTER — Encounter: Payer: Self-pay | Admitting: Family Medicine

## 2017-08-22 ENCOUNTER — Ambulatory Visit (INDEPENDENT_AMBULATORY_CARE_PROVIDER_SITE_OTHER): Payer: 59 | Admitting: Family Medicine

## 2017-08-22 VITALS — BP 120/80 | HR 97 | Temp 98.2°F | Resp 16 | Wt 201.6 lb

## 2017-08-22 DIAGNOSIS — R52 Pain, unspecified: Secondary | ICD-10-CM

## 2017-08-22 DIAGNOSIS — J101 Influenza due to other identified influenza virus with other respiratory manifestations: Secondary | ICD-10-CM

## 2017-08-22 DIAGNOSIS — E119 Type 2 diabetes mellitus without complications: Secondary | ICD-10-CM

## 2017-08-22 LAB — POCT INFLUENZA A/B
INFLUENZA A, POC: POSITIVE — AB
INFLUENZA B, POC: NEGATIVE

## 2017-08-22 MED ORDER — BENZONATATE 100 MG PO CAPS: 100.0000 mg | ORAL_CAPSULE | Freq: Three times a day (TID) | ORAL | 0 refills | Status: DC | PRN

## 2017-08-22 NOTE — Patient Instructions (Addendum)
Try vitamin C (orange juice if not diabetic or vitamin C tablets) and drink green tea to help your immune system during your illness Get plenty of rest and hydration Use the cough medicine pills if needed Okay to use plain mucinex with the cough pills, no interaction  Influenza, Adult Influenza ("the flu") is an infection in the lungs, nose, and throat (respiratory tract). It is caused by a virus. The flu causes many common cold symptoms, as well as a high fever and body aches. It can make you feel very sick. The flu spreads easily from person to person (is contagious). Getting a flu shot (influenza vaccination) every year is the best way to prevent the flu. Follow these instructions at home:  Take over-the-counter and prescription medicines only as told by your doctor.  Use a cool mist humidifier to add moisture (humidity) to the air in your home. This can make it easier to breathe.  Rest as needed.  Drink enough fluid to keep your pee (urine) clear or pale yellow.  Cover your mouth and nose when you cough or sneeze.  Wash your hands with soap and water often, especially after you cough or sneeze. If you cannot use soap and water, use hand sanitizer.  Stay home from work or school as told by your doctor. Unless you are visiting your doctor, try to avoid leaving home until your fever has been gone for 24 hours without the use of medicine.  Keep all follow-up visits as told by your doctor. This is important. How is this prevented?  Getting a yearly (annual) flu shot is the best way to avoid getting the flu. You may get the flu shot in late summer, fall, or winter. Ask your doctor when you should get your flu shot.  Wash your hands often or use hand sanitizer often.  Avoid contact with people who are sick during cold and flu season.  Eat healthy foods.  Drink plenty of fluids.  Get enough sleep.  Exercise regularly. Contact a doctor if:  You get new symptoms.  You  have: ? Chest pain. ? Watery poop (diarrhea). ? A fever.  Your cough gets worse.  You start to have more mucus.  You feel sick to your stomach (nauseous).  You throw up (vomit). Get help right away if:  You start to be short of breath or have trouble breathing.  Your skin or nails turn a bluish color.  You have very bad pain or stiffness in your neck.  You get a sudden headache.  You get sudden pain in your face or ear.  You cannot stop throwing up. This information is not intended to replace advice given to you by your health care provider. Make sure you discuss any questions you have with your health care provider. Document Released: 04/25/2008 Document Revised: 12/23/2015 Document Reviewed: 05/11/2015 Elsevier Interactive Patient Education  2017 [employer]  Influenza, Adult Influenza, more commonly known as "the flu," is a viral infection that primarily affects the respiratory tract. The respiratory tract includes organs that help you breathe, such as the lungs, nose, and throat. The flu causes many common cold symptoms, as well as a high fever and body aches. The flu spreads easily from person to person (is contagious). Getting a flu shot (influenza vaccination) every year is the best way to prevent influenza. What are the causes? Influenza is caused by a virus. You can catch the virus by:  Breathing in droplets from an infected person's cough or  sneeze.  Touching something that was recently contaminated with the virus and then touching your mouth, nose, or eyes.  What increases the risk? The following factors may make you more likely to get the flu:  Not cleaning your hands frequently with soap and water or alcohol-based hand sanitizer.  Having close contact with many people during cold and flu season.  Touching your mouth, eyes, or nose without washing or sanitizing your hands first.  Not drinking enough fluids or not eating a healthy diet.  Not getting  enough sleep or exercise.  Being under a high amount of stress.  Not getting a yearly (annual) flu shot.  You may be at a higher risk of complications from the flu, such as a severe lung infection (pneumonia), if you:  Are over the age of 50.  Are pregnant.  Have a weakened disease-fighting system (immune system). You may have a weakened immune system if you: ? Have HIV or AIDS. ? Are undergoing chemotherapy. ? Aretaking medicines that reduce the activity of (suppress) the immune system.  Have a long-term (chronic) illness, such as heart disease, kidney disease, diabetes, or lung disease.  Have a liver disorder.  Are obese.  Have anemia.  What are the signs or symptoms? Symptoms of this condition typically last 4-10 days and may include:  Fever.  Chills.  Headache, body aches, or muscle aches.  Sore throat.  Cough.  Runny or congested nose.  Chest discomfort and cough.  Poor appetite.  Weakness or tiredness (fatigue).  Dizziness.  Nausea or vomiting.  How is this diagnosed? This condition may be diagnosed based on your medical history and a physical exam. Your health care provider may do a nose or throat swab test to confirm the diagnosis. How is this treated? If influenza is detected early, you can be treated with antiviral medicine that can reduce the length of your illness and the severity of your symptoms. This medicine may be given by mouth (orally) or through an IV tube that is inserted in one of your veins. The goal of treatment is to relieve symptoms by taking care of yourself at home. This may include taking over-the-counter medicines, drinking plenty of fluids, and adding humidity to the air in your home. In some cases, influenza goes away on its own. Severe influenza or complications from influenza may be treated in a hospital. Follow these instructions at home:  Take over-the-counter and prescription medicines only as told by your health care  provider.  Use a cool mist humidifier to add humidity to the air in your home. This can make breathing easier.  Rest as needed.  Drink enough fluid to keep your urine clear or pale yellow.  Cover your mouth and nose when you cough or sneeze.  Wash your hands with soap and water often, especially after you cough or sneeze. If soap and water are not available, use hand sanitizer.  Stay home from work or school as told by your health care provider. Unless you are visiting your health care provider, try to avoid leaving home until your fever has been gone for 24 hours without the use of medicine.  Keep all follow-up visits as told by your health care provider. This is important. How is this prevented?  Getting an annual flu shot is the best way to avoid getting the flu. You may get the flu shot in late summer, fall, or winter. Ask your health care provider when you should get your flu shot.  Wash  your hands often or use hand sanitizer often.  Avoid contact with people who are sick during cold and flu season.  Eat a healthy diet, drink plenty of fluids, get enough sleep, and exercise regularly. Contact a health care provider if:  You develop new symptoms.  You have: ? Chest pain. ? Diarrhea. ? A fever.  Your cough gets worse.  You produce more mucus.  You feel nauseous or you vomit. Get help right away if:  You develop shortness of breath or difficulty breathing.  Your skin or nails turn a bluish color.  You have severe pain or stiffness in your neck.  You develop a sudden headache or sudden pain in your face or ear.  You cannot stop vomiting. This information is not intended to replace advice given to you by your health care provider. Make sure you discuss any questions you have with your health care provider. Document Released: 07/14/2000 Document Revised: 12/23/2015 Document Reviewed: 05/11/2015 Elsevier Interactive Patient Education  2017 [employer].

## 2017-08-22 NOTE — Progress Notes (Signed)
BP 120/80   Pulse 97   Temp 98.2 F (36.8 C) (Oral)   Resp 16   Wt 201 lb 9.6 oz (91.4 kg)   SpO2 99%   BMI 35.60 kg/m    Subjective:    Patient ID: Elizabeth Scott Adelson, female    DOB: 05/10/1961, 57 y.o.   MRN: [id number]  HPI: Elizabeth Scott Cockerell is a 57 y.o. female  Chief Complaint  Patient presents with  . 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 Allergy   . Chronic sinusitis   . Diabetes mellitus without complication (HCC)   . GERD (gastroesophageal reflux disease)   . High cholesterol   . History of abnormal cervical Pap smear    Prior to Hysterectomy, normal paps for 2-3 years after Hysterectomy  . History of gestational diabetes   . Hypertension   . Hypopotassemia   . Microcytosis    Past Surgical History:  Procedure Laterality Date  . ABDOMINAL HYSTERECTOMY  2003   still has ovaries-for menorrhagia  . CESAREAN SECTION    . COLONOSCOPY  02/22/14   repeat 5 years  . ESOPHAGOGASTRODUODENOSCOPY  02/22/14  . TUBAL LIGATION     Family History  Problem Relation Age of Onset  . Diabetes Mother   . Heart disease Mother   . Hypertension Mother   . Stroke Mother   . Diabetes Father   . Hypertension Father   . Asthma Sister     . Diabetes Sister   . Hypertension Sister   . Lung disease Sister   . Hypertension Brother   . Heart disease Brother   . Stroke Brother   . Asthma Sister   . Diabetes Sister   . Hypertension Sister   . Congestive Heart Failure Maternal Grandmother   . Cancer Paternal Grandfather        stomach cancer  . COPD Neg Hx    Social History   Tobacco Use  . Smoking status: Never Smoker  . Smokeless tobacco: Never Used  Substance Use Topics  . Alcohol use: No  . Drug use: No    Interim medical history since last visit reviewed. Allergies and medications reviewed  Review of Systems Per HPI unless specifically indicated above     Objective:    BP 120/80   Pulse 97   Temp 98.2 F (36.8 C) (Oral)   Resp 16   Wt 201 lb 9.6 oz (91.4 kg)   SpO2 99%   BMI 35.60 kg/m   Wt Readings from Last 3 Encounters:  08/22/17 201 lb 9.6 oz (91.4 kg)  03/09/17 201 lb 8 oz (91.4 kg)  12/29/16  202 lb 4.8 oz (91.8 kg)    Physical Exam  Constitutional: She appears well-developed and well-nourished.  HENT:  Right Ear: Tympanic membrane and ear canal normal.  Left Ear: Tympanic membrane and ear canal normal.  Nose: Rhinorrhea (clear) present.  Mouth/Throat: Oropharynx is clear and moist and mucous membranes are normal. No posterior oropharyngeal edema or posterior oropharyngeal erythema.  Eyes: EOM are normal. No scleral icterus.  Cardiovascular: Normal rate and regular rhythm.  Pulmonary/Chest: Effort normal and breath sounds normal.  Lymphadenopathy:    She has no cervical adenopathy.  Psychiatric: She has a normal mood and affect. Her behavior is normal.   Diabetic Foot Form - Detailed   Diabetic Foot Exam - detailed Diabetic Foot exam was performed with the following findings:  Yes 08/28/2017 12:35 PM  Visual Foot Exam completed.:  Yes  Pulse Foot Exam completed.:  Yes  Right Dorsalis Pedis:  Present Left Dorsalis Pedis:  Present  Sensory Foot Exam Completed.:   Yes Semmes-Weinstein Monofilament Test R Site 1-Great Toe:  Pos L Site 1-Great Toe:  Pos       Results for orders placed or performed in visit on 08/22/17  POCT Influenza A/B  Result Value Ref Range   Influenza A, POC Positive (A) Negative   Influenza B, POC Negative Negative      Assessment & Plan:   Problem List Items Addressed This Visit      Endocrine   DM type 2 (diabetes mellitus, type 2) (HCC)    Foot exam by MD       Other Visit Diagnoses    Influenza A    -  Primary   explained dx; rest, hydration, watch for s/s of secondary pneumonia, seek medical care immediately if worsening   Body aches       Relevant Orders   POCT Influenza A/B (Completed)       Follow up plan: No Follow-up on file.  An after-visit summary was printed and given to the patient at check-out.  Please see the patient instructions which may contain other information and recommendations beyond what is mentioned above in the assessment and plan.  Meds ordered this encounter  Medications  . benzonatate (TESSALON PERLES) 100 MG capsule    Sig: Take 1 capsule (100 mg total) by mouth every 8 (eight) hours as needed for cough.    Dispense:  30 capsule    Refill:  0    Orders Placed This Encounter  Procedures  . POCT Influenza A/B

## 2017-08-28 NOTE — Assessment & Plan Note (Signed)
Foot exam by MD 

## 2017-09-14 ENCOUNTER — Encounter: Payer: 59 | Admitting: Family Medicine

## 2017-10-17 ENCOUNTER — Encounter: Payer: Self-pay | Admitting: Family Medicine

## 2017-10-17 ENCOUNTER — Ambulatory Visit (INDEPENDENT_AMBULATORY_CARE_PROVIDER_SITE_OTHER): Payer: 59 | Admitting: Family Medicine

## 2017-10-17 VITALS — BP 136/78 | HR 92 | Temp 98.1°F | Resp 14 | Wt 204.3 lb

## 2017-10-17 DIAGNOSIS — Z0000 Encounter for general adult medical examination without abnormal findings: Secondary | ICD-10-CM

## 2017-10-17 DIAGNOSIS — Z1231 Encounter for screening mammogram for malignant neoplasm of breast: Secondary | ICD-10-CM

## 2017-10-17 DIAGNOSIS — D509 Iron deficiency anemia, unspecified: Secondary | ICD-10-CM | POA: Insufficient documentation

## 2017-10-17 DIAGNOSIS — Z124 Encounter for screening for malignant neoplasm of cervix: Secondary | ICD-10-CM | POA: Diagnosis not present

## 2017-10-17 DIAGNOSIS — N898 Other specified noninflammatory disorders of vagina: Secondary | ICD-10-CM

## 2017-10-17 DIAGNOSIS — Z113 Encounter for screening for infections with a predominantly sexual mode of transmission: Secondary | ICD-10-CM

## 2017-10-17 DIAGNOSIS — E119 Type 2 diabetes mellitus without complications: Secondary | ICD-10-CM | POA: Diagnosis not present

## 2017-10-17 DIAGNOSIS — Z1239 Encounter for other screening for malignant neoplasm of breast: Secondary | ICD-10-CM

## 2017-10-17 DIAGNOSIS — I10 Essential (primary) hypertension: Secondary | ICD-10-CM | POA: Insufficient documentation

## 2017-10-17 NOTE — Progress Notes (Signed)
Patient ID: Elizabeth Scott, female   DOB: 1961/04/23, 57 y.o.   MRN: [id number]   Subjective:   Elizabeth Scott is a 57 y.o. female here for a complete physical exam  Interim issues since last visit: had the flu last time; got over without issues; still has a little cough  USPSTF grade A and B recommendations Depression:  Depression screen [facility] [address] 10/17/2017 08/22/2017 03/09/2017 12/29/2016 09/08/2016  Decreased Interest 0 0 0 0 0  Down, Depressed, Hopeless 0 0 0 0 0  PHQ - 2 Score 0 0 0 0 0   Hypertension: BP Readings from Last 3 Encounters:  10/17/17 136/78  08/22/17 120/80  03/09/17 116/62   Obesity: Wt Readings from Last 3 Encounters:  10/17/17 204 lb 4.8 oz (92.7 kg)  08/22/17 201 lb 9.6 oz (91.4 kg)  03/09/17 201 lb 8 oz (91.4 kg)   BMI Readings from Last 3 Encounters:  10/17/17 36.08 kg/m  08/22/17 35.60 kg/m  03/09/17 35.58 kg/m    Skin cancer: nothing worrisome Lung cancer:  Never smoker Breast cancer: no lumps or bumps; no nipple discharge; last mammo in 2013; doing SBE Colorectal cancer: UTD; no rectal bleeding Cervical cancer screening: s/p hysterectomy for noncancerous reasons she thinks but was told she had cancer cells; excessive bleeding; in 2003; she went back for f/u after that; he said she didn't have to do pap smears after that; she keeps a yeast infection and wants to do one this time BRCA gene screening: family hx of breast and/or ovarian cancer and/or metastatic prostate cancer? no HIV, hep B, hep C: not intersted STD testing and prevention (chl/gon/syphilis): opted Intimate partner violence: no abuse Contraception: n/a Osteoporosis: n/a Fall prevention/vitamin D: discussed Immunizations: UTD; shingles vaccine Diet: pretty good, some room for improvement Exercise: room for improvement Alcohol: no Tobacco use: no AAA: n/a Aspirin: taking Glucose: today Glucose, Bld  Date Value Ref Range Status  03/09/2017 153 (H) 65 - 99 mg/dL  Final  09/08/2016 94 65 - 99 mg/dL Final  03/31/2016 219 (H) 65 - 99 mg/dL Final   Lipids:  Lab Results  Component Value Date   CHOL 151 03/09/2017   CHOL 144 09/08/2016   CHOL 158 03/31/2016   Lab Results  Component Value Date   HDL 44 (L) 03/09/2017   HDL 38 (L) 09/08/2016   HDL 19 (L) 03/31/2016   Lab Results  Component Value Date   LDLCALC 85 03/09/2017   LDLCALC 82 09/08/2016   LDLCALC 87 03/31/2016   Lab Results  Component Value Date   TRIG 110 03/09/2017   TRIG 121 09/08/2016   TRIG 259 (H) 03/31/2016   Lab Results  Component Value Date   CHOLHDL 3.4 03/09/2017   CHOLHDL 3.8 09/08/2016   CHOLHDL 8.3 (H) 03/31/2016   No results found for: LDLDIRECT   Past Medical History:  Diagnosis Date  . Allergy   . Chronic sinusitis   . Diabetes mellitus without complication ([place])   . GERD (gastroesophageal reflux disease)   . High cholesterol   . History of abnormal cervical Pap smear    Prior to Hysterectomy, normal paps for 2-3 years after Hysterectomy  . History of gestational diabetes   . Hypertension   . Hypopotassemia   . Microcytosis    Past Surgical History:  Procedure Laterality Date  . ABDOMINAL HYSTERECTOMY  2003   still has ovaries-for menorrhagia  . CESAREAN SECTION    . COLONOSCOPY  02/22/14   repeat 5 years  .  ESOPHAGOGASTRODUODENOSCOPY  02/22/14  . TUBAL LIGATION     Family History  Problem Relation Age of Onset  . Diabetes Mother   . Heart disease Mother   . Hypertension Mother   . Stroke Mother   . Diabetes Father   . Hypertension Father   . Asthma Sister   . Diabetes Sister   . Hypertension Sister   . Lung disease Sister   . Hypertension Brother   . Heart disease Brother   . Stroke Brother   . Asthma Sister   . Diabetes Sister   . Hypertension Sister   . Congestive Heart Failure Maternal Grandmother   . Cancer Paternal Grandfather        stomach cancer  . COPD Neg Hx    Social History   Tobacco Use  . Smoking status:  Never Smoker  . Smokeless tobacco: Never Used  Substance Use Topics  . Alcohol use: No  . Drug use: No   Review of Systems  Objective:   Vitals:   10/17/17 0916  BP: 136/78  Pulse: 92  Resp: 14  Temp: 98.1 F (36.7 C)  TempSrc: Oral  SpO2: 94%  Weight: 204 lb 4.8 oz (92.7 kg)   Body mass index is 36.08 kg/m. Wt Readings from Last 3 Encounters:  10/17/17 204 lb 4.8 oz (92.7 kg)  08/22/17 201 lb 9.6 oz (91.4 kg)  03/09/17 201 lb 8 oz (91.4 kg)   Physical Exam  Constitutional: She appears well-developed and well-nourished.  HENT:  Head: Normocephalic and atraumatic.  Eyes: Conjunctivae and EOM are normal. Right eye exhibits no hordeolum. Left eye exhibits no hordeolum. No scleral icterus.  Neck: Carotid bruit is not present. No thyromegaly present.  Cardiovascular: Normal rate, regular rhythm, S1 normal, S2 normal and normal heart sounds.  No extrasystoles are present.  Pulmonary/Chest: Effort normal and breath sounds normal. No respiratory distress. Right breast exhibits no inverted nipple, no mass, no nipple discharge, no skin change and no tenderness. Left breast exhibits no inverted nipple, no mass, no nipple discharge, no skin change and no tenderness. Breasts are symmetrical.  Abdominal: Soft. Normal appearance and bowel sounds are normal. She exhibits no distension, no abdominal bruit, no pulsatile midline mass and no mass. There is no hepatosplenomegaly. There is no tenderness. No hernia.  Genitourinary: Pelvic exam was performed with patient prone. There is no rash or lesion on the right labia. There is no rash or lesion on the left labia. Right adnexum displays no mass, no tenderness and no fullness. Left adnexum displays no mass, no tenderness and no fullness. Vaginal discharge (white) found.  Musculoskeletal: Normal range of motion. She exhibits no edema.  Lymphadenopathy:       Head (right side): No submandibular adenopathy present.       Head (left side): No  submandibular adenopathy present.    She has no cervical adenopathy.    She has no axillary adenopathy.  Neurological: She is alert. She displays no tremor. No cranial nerve deficit. She exhibits normal muscle tone. Gait normal.  Skin: Skin is warm and dry. No bruising and no ecchymosis noted. No cyanosis. No pallor.  Psychiatric: Her speech is normal and behavior is normal. Thought content normal. Her mood appears not anxious. She does not exhibit a depressed mood.    Assessment/Plan:   Problem List Items Addressed This Visit      Endocrine   DM type 2 (diabetes mellitus, type 2) (HCC)    Check A1c  Relevant Orders   Hemoglobin A1c     Other   Preventative health care - Primary    USPSTF grade A and B recommendations reviewed with patient; age-appropriate recommendations, preventive care, screening tests, etc discussed and encouraged; healthy living encouraged; see AVS for patient education given to patient       Relevant Orders   CBC with Differential/Platelet   COMPLETE METABOLIC PANEL WITH GFR   Lipid panel   TSH    Other Visit Diagnoses    Screening for breast cancer       Relevant Orders   MM Digital Screening   Vaginal itching       Relevant Orders   WET PREP BY MOLECULAR PROBE   Papanicolaou smear for cervical cancer screening       Relevant Orders   Pap IG and HPV (high risk) DNA detection   Screen for STD (sexually transmitted disease)       Relevant Orders   HIV antibody   Hepatitis panel, acute   C. trachomatis/N. gonorrhoeae RNA   RPR       No orders of the defined types were placed in this encounter.  Orders Placed This Encounter  Procedures  . WET PREP BY MOLECULAR PROBE  . C. trachomatis/N. gonorrhoeae RNA  . MM Digital Screening    Standing Status:   Future    Standing Expiration Date:   12/18/2018    Order Specific Question:   Reason for Exam (SYMPTOM  OR DIAGNOSIS REQUIRED)    Answer:   screen for breast cancer    Order Specific  Question:   Is the patient pregnant?    Answer:   No    Order Specific Question:   Preferred imaging location?    Answer:   [facility]  . CBC with Differential/Platelet  . COMPLETE METABOLIC PANEL WITH GFR  . Hemoglobin A1c  . Lipid panel  . TSH  . HIV antibody  . Hepatitis panel, acute  . RPR    Follow up plan: Return in about 1 year (around 10/18/2018) for complete physical, 1 month diabetes.  An After Visit Summary was printed and given to the patient.

## 2017-10-17 NOTE — Assessment & Plan Note (Signed)
USPSTF grade A and B recommendations reviewed with patient; age-appropriate recommendations, preventive care, screening tests, etc discussed and encouraged; healthy living encouraged; see AVS for patient education given to patient  

## 2017-10-17 NOTE — Assessment & Plan Note (Signed)
Check A1c. 

## 2017-10-17 NOTE — Patient Instructions (Addendum)
Try to get 1000 iu of vitamin D3 when not spending time outdoors or in the winter Consider getting the new shingles vaccine called Shingrix; that is available for individuals 56 years of age and older, and is recommended even if you have had shingles in the past and/or already received the old shingles vaccine (Zostavax); it is a two-part series, and is available at many local pharmacies  If you have not heard anything from my staff in a week about any orders/referrals/studies from today, please contact us here to follow-up [phone]  Health Maintenance, Female Adopting a healthy lifestyle and getting preventive care can go a long way to promote health and wellness. Talk with your health care provider about what schedule of regular examinations is right for you. This is a good chance for you to check in with your provider about disease prevention and staying healthy. In between checkups, there are plenty of things you can do on your own. Experts have done a lot of research about which lifestyle changes and preventive measures are most likely to keep you healthy. Ask your health care provider for more information. Weight and diet Eat a healthy diet  Be sure to include plenty of vegetables, fruits, low-fat dairy products, and lean protein.  Do not eat a lot of foods high in solid fats, added sugars, or salt.  Get regular exercise. This is one of the most important things you can do for your health. ? Most adults should exercise for at least 150 minutes each week. The exercise should increase your heart rate and make you sweat (moderate-intensity exercise). ? Most adults should also do strengthening exercises at least twice a week. This is in addition to the moderate-intensity exercise.  Maintain a healthy weight  Body mass index (BMI) is a measurement that can be used to identify possible weight problems. It estimates body fat based on height and weight. Your health care provider can help  determine your BMI and help you achieve or maintain a healthy weight.  For females 66 years of age and older: ? A BMI below 18.5 is considered underweight. ? A BMI of 18.5 to 24.9 is normal. ? A BMI of 25 to 29.9 is considered overweight. ? A BMI of 30 and above is considered obese.  Watch levels of cholesterol and blood lipids  You should start having your blood tested for lipids and cholesterol at 56 years of age, then have this test every 5 years.  You may need to have your cholesterol levels checked more often if: ? Your lipid or cholesterol levels are high. ? You are older than 56 years of age. ? You are at high risk for heart disease.  Cancer screening Lung Cancer  Lung cancer screening is recommended for adults 58-64 years old who are at high risk for lung cancer because of a history of smoking.  A yearly low-dose CT scan of the lungs is recommended for people who: ? Currently smoke. ? Have quit within the past 15 years. ? Have at least a 30-pack-year history of smoking. A pack year is smoking an average of one pack of cigarettes a day for 1 year.  Yearly screening should continue until it has been 15 years since you quit.  Yearly screening should stop if you develop a health problem that would prevent you from having lung cancer treatment.  Breast Cancer  Practice breast self-awareness. This means understanding how your breasts normally appear and feel.  It also means doing  regular breast self-exams. Let your health care provider know about any changes, no matter how small.  If you are in your 20s or 30s, you should have a clinical breast exam (CBE) by a health care provider every 1-3 years as part of a regular health exam.  If you are 42 or older, have a CBE every year. Also consider having a breast X-Krol (mammogram) every year.  If you have a family history of breast cancer, talk to your health care provider about genetic screening.  If you are at high risk for  breast cancer, talk to your health care provider about having an MRI and a mammogram every year.  Breast cancer gene (BRCA) assessment is recommended for women who have family members with BRCA-related cancers. BRCA-related cancers include: ? Breast. ? Ovarian. ? Tubal. ? Peritoneal cancers.  Results of the assessment will determine the need for genetic counseling and BRCA1 and BRCA2 testing.  Cervical Cancer Your health care provider may recommend that you be screened regularly for cancer of the pelvic organs (ovaries, uterus, and vagina). This screening involves a pelvic examination, including checking for microscopic changes to the surface of your cervix (Pap test). You may be encouraged to have this screening done every 3 years, beginning at age 57.  For women ages 56-65, health care providers may recommend pelvic exams and Pap testing every 3 years, or they may recommend the Pap and pelvic exam, combined with testing for human papilloma virus (HPV), every 5 years. Some types of HPV increase your risk of cervical cancer. Testing for HPV may also be done on women of any age with unclear Pap test results.  Other health care providers may not recommend any screening for nonpregnant women who are considered low risk for pelvic cancer and who do not have symptoms. Ask your health care provider if a screening pelvic exam is right for you.  If you have had past treatment for cervical cancer or a condition that could lead to cancer, you need Pap tests and screening for cancer for at least 20 years after your treatment. If Pap tests have been discontinued, your risk factors (such as having a new sexual partner) need to be reassessed to determine if screening should resume. Some women have medical problems that increase the chance of getting cervical cancer. In these cases, your health care provider may recommend more frequent screening and Pap tests.  Colorectal Cancer  This type of cancer can be  detected and often prevented.  Routine colorectal cancer screening usually begins at 56 years of age and continues through 56 years of age.  Your health care provider may recommend screening at an earlier age if you have risk factors for colon cancer.  Your health care provider may also recommend using home test kits to check for hidden blood in the stool.  A small camera at the end of a tube can be used to examine your colon directly (sigmoidoscopy or colonoscopy). This is done to check for the earliest forms of colorectal cancer.  Routine screening usually begins at age 19.  Direct examination of the colon should be repeated every 5-10 years through 56 years of age. However, you may need to be screened more often if early forms of precancerous polyps or small growths are found.  Skin Cancer  Check your skin from head to toe regularly.  Tell your health care provider about any new moles or changes in moles, especially if there is a change in a mole's  shape or color.  Also tell your health care provider if you have a mole that is larger than the size of a pencil eraser.  Always use sunscreen. Apply sunscreen liberally and repeatedly throughout the day.  Protect yourself by wearing long sleeves, pants, a wide-brimmed hat, and sunglasses whenever you are outside.  Heart disease, diabetes, and high blood pressure  High blood pressure causes heart disease and increases the risk of stroke. High blood pressure is more likely to develop in: ? People who have blood pressure in the high end of the normal range (130-139/85-89 mm Hg). ? People who are overweight or obese. ? People who are African American.  If you are 15-56 years of age, have your blood pressure checked every 3-5 years. If you are 31 years of age or older, have your blood pressure checked every year. You should have your blood pressure measured twice-once when you are at a hospital or clinic, and once when you are not at a  hospital or clinic. Record the average of the two measurements. To check your blood pressure when you are not at a hospital or clinic, you can use: ? An automated blood pressure machine at a pharmacy. ? A home blood pressure monitor.  If you are between 76 years and 28 years old, ask your health care provider if you should take aspirin to prevent strokes.  Have regular diabetes screenings. This involves taking a blood sample to check your fasting blood sugar level. ? If you are at a normal weight and have a low risk for diabetes, have this test once every three years after 56 years of age. ? If you are overweight and have a high risk for diabetes, consider being tested at a younger age or more often. Preventing infection Hepatitis B  If you have a higher risk for hepatitis B, you should be screened for this virus. You are considered at high risk for hepatitis B if: ? You were born in a country where hepatitis B is common. Ask your health care provider which countries are considered high risk. ? Your parents were born in a high-risk country, and you have not been immunized against hepatitis B (hepatitis B vaccine). ? You have HIV or AIDS. ? You use needles to inject street drugs. ? You live with someone who has hepatitis B. ? You have had sex with someone who has hepatitis B. ? You get hemodialysis treatment. ? You take certain medicines for conditions, including cancer, organ transplantation, and autoimmune conditions.  Hepatitis C  Blood testing is recommended for: ? Everyone born from [birth year] through 1964. ? Anyone with known risk factors for hepatitis C.  Sexually transmitted infections (STIs)  You should be screened for sexually transmitted infections (STIs) including gonorrhea and chlamydia if: ? You are sexually active and are younger than 57 years of age. ? You are older than 56 years of age and your health care provider tells you that you are at risk for this type of  infection. ? Your sexual activity has changed since you were last screened and you are at an increased risk for chlamydia or gonorrhea. Ask your health care provider if you are at risk.  If you do not have HIV, but are at risk, it may be recommended that you take a prescription medicine daily to prevent HIV infection. This is called pre-exposure prophylaxis (PrEP). You are considered at risk if: ? You are sexually active and do not regularly use condoms or know  the HIV status of your partner(s). ? You take drugs by injection. ? You are sexually active with a partner who has HIV.  Talk with your health care provider about whether you are at high risk of being infected with HIV. If you choose to begin PrEP, you should first be tested for HIV. You should then be tested every 3 months for as long as you are taking PrEP. Pregnancy  If you are premenopausal and you may become pregnant, ask your health care provider about preconception counseling.  If you may become pregnant, take 400 to 800 micrograms (mcg) of folic acid every day.  If you want to prevent pregnancy, talk to your health care provider about birth control (contraception). Osteoporosis and menopause  Osteoporosis is a disease in which the bones lose minerals and strength with aging. This can result in serious bone fractures. Your risk for osteoporosis can be identified using a bone density scan.  If you are 56 years of age or older, or if you are at risk for osteoporosis and fractures, ask your health care provider if you should be screened.  Ask your health care provider whether you should take a calcium or vitamin D supplement to lower your risk for osteoporosis.  Menopause may have certain physical symptoms and risks.  Hormone replacement therapy may reduce some of these symptoms and risks. Talk to your health care provider about whether hormone replacement therapy is right for you. Follow these instructions at home:  Schedule  regular health, dental, and eye exams.  Stay current with your immunizations.  Do not use any tobacco products including cigarettes, chewing tobacco, or electronic cigarettes.  If you are pregnant, do not drink alcohol.  If you are breastfeeding, limit how much and how often you drink alcohol.  Limit alcohol intake to no more than 1 drink per day for nonpregnant women. One drink equals 12 ounces of beer, 5 ounces of wine, or 1 ounces of hard liquor.  Do not use street drugs.  Do not share needles.  Ask your health care provider for help if you need support or information about quitting drugs.  Tell your health care provider if you often feel depressed.  Tell your health care provider if you have ever been abused or do not feel safe at home. This information is not intended to replace advice given to you by your health care provider. Make sure you discuss any questions you have with your health care provider. Document Released: 01/30/2011 Document Revised: 12/23/2015 Document Reviewed: 04/20/2015 Elsevier Interactive Patient Education  [employer].

## 2017-10-18 ENCOUNTER — Other Ambulatory Visit: Payer: Self-pay | Admitting: Family Medicine

## 2017-10-18 ENCOUNTER — Telehealth: Payer: Self-pay

## 2017-10-18 DIAGNOSIS — E7800 Pure hypercholesterolemia, unspecified: Secondary | ICD-10-CM

## 2017-10-18 LAB — COMPLETE METABOLIC PANEL WITH GFR
AG Ratio: 1.4 (calc) (ref 1.0–2.5)
ALKALINE PHOSPHATASE (APISO): 72 U/L (ref 33–130)
ALT: 15 U/L (ref 6–29)
AST: 13 U/L (ref 10–35)
Albumin: 3.9 g/dL (ref 3.6–5.1)
BILIRUBIN TOTAL: 0.8 mg/dL (ref 0.2–1.2)
BUN: 15 mg/dL (ref 7–25)
CHLORIDE: 100 mmol/L (ref 98–110)
CO2: 30 mmol/L (ref 20–32)
Calcium: 9.2 mg/dL (ref 8.6–10.4)
Creat: 0.71 mg/dL (ref 0.50–1.05)
GFR, Est African American: 111 mL/min/{1.73_m2} (ref >=60)
GFR, Est Non African American: 96 mL/min/{1.73_m2} (ref >=60)
GLUCOSE: 145 mg/dL — AB (ref 65–99)
Globulin: 2.7 g/dL (calc) (ref 1.9–3.7)
Potassium: 3.3 mmol/L — ABNORMAL LOW (ref 3.5–5.3)
Sodium: 137 mmol/L (ref 135–146)
Total Protein: 6.6 g/dL (ref 6.1–8.1)

## 2017-10-18 LAB — RPR: RPR: NONREACTIVE

## 2017-10-18 LAB — CBC WITH DIFFERENTIAL/PLATELET
Basophils Absolute: 75 cells/uL (ref 0–200)
Basophils Relative: 0.7 %
EOS ABS: 161 {cells}/uL (ref 15–500)
Eosinophils Relative: 1.5 %
HCT: 38.8 % (ref 35.0–45.0)
Hemoglobin: 13.3 g/dL (ref 11.7–15.5)
Lymphs Abs: 2044 cells/uL (ref 850–3900)
MCH: 25.1 pg — AB (ref 27.0–33.0)
MCHC: 34.3 g/dL (ref 32.0–36.0)
MCV: 73.3 fL — ABNORMAL LOW (ref 80.0–100.0)
MONOS PCT: 6.1 %
MPV: 12.5 fL (ref 7.5–12.5)
Neutro Abs: 7768 cells/uL (ref 1500–7800)
Neutrophils Relative %: 72.6 %
PLATELETS: 267 10*3/uL (ref 140–400)
RBC: 5.29 10*6/uL — ABNORMAL HIGH (ref 3.80–5.10)
RDW: 16.3 % — AB (ref 11.0–15.0)
TOTAL LYMPHOCYTE: 19.1 %
WBC mixed population: 653 cells/uL (ref 200–950)
WBC: 10.7 10*3/uL (ref 3.8–10.8)

## 2017-10-18 LAB — TSH: TSH: 1.62 mIU/L

## 2017-10-18 LAB — LIPID PANEL
CHOLESTEROL: 185 mg/dL (ref <200)
HDL: 39 mg/dL — ABNORMAL LOW (ref >50)
LDL Cholesterol (Calc): 117 mg/dL (calc) — ABNORMAL HIGH
Non-HDL Cholesterol (Calc): 146 mg/dL (calc) — ABNORMAL HIGH (ref <130)
Total CHOL/HDL Ratio: 4.7 (calc) (ref <5.0)
Triglycerides: 175 mg/dL — ABNORMAL HIGH (ref <150)

## 2017-10-18 LAB — WET PREP BY MOLECULAR PROBE
Candida species: NOT DETECTED
GARDNERELLA VAGINALIS: NOT DETECTED
MICRO NUMBER:: 90354552
SPECIMEN QUALITY: ADEQUATE
TRICHOMONAS VAG: NOT DETECTED

## 2017-10-18 LAB — HEMOGLOBIN A1C
EAG (MMOL/L): 8.2 (calc)
HEMOGLOBIN A1C: 6.8 %{Hb} — AB (ref <5.7)
MEAN PLASMA GLUCOSE: 148 (calc)

## 2017-10-18 LAB — HEPATITIS PANEL, ACUTE
HEP A IGM: NONREACTIVE
Hep B C IgM: NONREACTIVE
Hepatitis B Surface Ag: NONREACTIVE
Hepatitis C Ab: NONREACTIVE
SIGNAL TO CUT-OFF: 0.02 (ref <1.00)

## 2017-10-18 LAB — C. TRACHOMATIS/N. GONORRHOEAE RNA
C. trachomatis RNA, TMA: NOT DETECTED
N. GONORRHOEAE RNA, TMA: NOT DETECTED

## 2017-10-18 LAB — HIV ANTIBODY (ROUTINE TESTING W REFLEX): HIV: NONREACTIVE

## 2017-10-18 MED ORDER — POTASSIUM CHLORIDE ER 10 MEQ PO TBCR: 10.0000 meq | EXTENDED_RELEASE_TABLET | Freq: Every day | ORAL | 0 refills | Status: DC

## 2017-10-18 MED ORDER — ATORVASTATIN CALCIUM 80 MG PO TABS: 80.0000 mg | ORAL_TABLET | Freq: Every day | ORAL | 1 refills | Status: DC

## 2017-10-18 NOTE — Telephone Encounter (Signed)
-----   Message from Kerman PasseyMelinda P Lada, MD sent at 10/18/2017  4:46 PM EDT ----- Idamae Schullerkie, please let pt know that her potassium is a little on the low side; I'll send in a few days worth of potassium, and then she can eat a banana a few times a week to keep it above 3.5; her A1c is stable; her cholesterol is too high; her med list shows that she has been on 40 mg of lipitor; I'd like to increase that to 80 mg; recheck fasting lipids in 6-8 weeks; other labs okay

## 2017-10-18 NOTE — Telephone Encounter (Signed)
-----   Message from Kerman PasseyMelinda P Lada, MD sent at 10/17/2017  5:16 PM EDT ----- Idamae Schullerkie, CBC is stable

## 2017-10-18 NOTE — Telephone Encounter (Signed)
Pt informed

## 2017-10-18 NOTE — Progress Notes (Signed)
Potassium suppl for 3 days, then occasional banana

## 2017-10-18 NOTE — Telephone Encounter (Signed)
Called pt's home and mobile numbers. No answer. No voicemail. CRM created. [facility]

## 2017-10-19 LAB — PAP IG AND HPV HIGH-RISK: HPV DNA High Risk: NOT DETECTED

## 2017-10-19 NOTE — Telephone Encounter (Signed)
Called pt, informed her of the results below. Pt gave verbal understanding. Lipid order placed.

## 2017-10-23 ENCOUNTER — Telehealth: Payer: Self-pay

## 2017-10-23 ENCOUNTER — Other Ambulatory Visit: Payer: Self-pay | Admitting: Family Medicine

## 2017-10-23 MED ORDER — FLUCONAZOLE 150 MG PO TABS: 150.0000 mg | ORAL_TABLET | Freq: Once | ORAL | 0 refills | Status: AC

## 2017-10-23 NOTE — Telephone Encounter (Signed)
Called pt informed her of neg-neg pap. Also informed pt of yeast infection. Pt gave verbal understanding.

## 2017-10-23 NOTE — Telephone Encounter (Signed)
-----   Message from Kerman PasseyMelinda P Lada, MD sent at 10/23/2017  9:16 AM EDT ----- Let pt know results of pap; Rx sent for diflucan

## 2017-10-23 NOTE — Progress Notes (Signed)
Diflucan rx 

## 2017-11-23 ENCOUNTER — Ambulatory Visit (INDEPENDENT_AMBULATORY_CARE_PROVIDER_SITE_OTHER): Payer: 59 | Admitting: Family Medicine

## 2017-11-23 ENCOUNTER — Other Ambulatory Visit: Payer: Self-pay

## 2017-11-23 ENCOUNTER — Encounter: Payer: Self-pay | Admitting: Family Medicine

## 2017-11-23 VITALS — BP 126/80 | HR 86 | Temp 97.7°F | Ht 63.0 in | Wt 201.1 lb

## 2017-11-23 DIAGNOSIS — B379 Candidiasis, unspecified: Secondary | ICD-10-CM | POA: Diagnosis not present

## 2017-11-23 DIAGNOSIS — I10 Essential (primary) hypertension: Secondary | ICD-10-CM

## 2017-11-23 DIAGNOSIS — E119 Type 2 diabetes mellitus without complications: Secondary | ICD-10-CM

## 2017-11-23 DIAGNOSIS — E7800 Pure hypercholesterolemia, unspecified: Secondary | ICD-10-CM | POA: Diagnosis not present

## 2017-11-23 DIAGNOSIS — E876 Hypokalemia: Secondary | ICD-10-CM

## 2017-11-23 LAB — BASIC METABOLIC PANEL
BUN: 14 mg/dL (ref 7–25)
CALCIUM: 9.7 mg/dL (ref 8.6–10.4)
CO2: 27 mmol/L (ref 20–32)
CREATININE: 0.72 mg/dL (ref 0.50–1.05)
Chloride: 99 mmol/L (ref 98–110)
GLUCOSE: 127 mg/dL — AB (ref 65–99)
Potassium: 3.2 mmol/L — ABNORMAL LOW (ref 3.5–5.3)
SODIUM: 137 mmol/L (ref 135–146)

## 2017-11-23 LAB — LIPID PANEL
CHOL/HDL RATIO: 4.1 (calc) (ref <5.0)
CHOLESTEROL: 152 mg/dL (ref <200)
HDL: 37 mg/dL — ABNORMAL LOW (ref >50)
LDL CHOLESTEROL (CALC): 95 mg/dL
Non-HDL Cholesterol (Calc): 115 mg/dL (calc) (ref <130)
TRIGLYCERIDES: 105 mg/dL (ref <150)

## 2017-11-23 MED ORDER — FLUCONAZOLE 150 MG PO TABS: 150.0000 mg | ORAL_TABLET | Freq: Once | ORAL | 1 refills | Status: AC

## 2017-11-23 MED ORDER — DOXYCYCLINE HYCLATE 100 MG PO TABS: 100.0000 mg | ORAL_TABLET | Freq: Two times a day (BID) | ORAL | 0 refills | Status: DC

## 2017-11-23 MED ORDER — LISINOPRIL-HYDROCHLOROTHIAZIDE 20-25 MG PO TABS: 1.0000 | ORAL_TABLET | Freq: Every day | ORAL | 1 refills | Status: DC

## 2017-11-23 MED ORDER — GLIPIZIDE ER 10 MG PO TB24: 10.0000 mg | ORAL_TABLET | Freq: Two times a day (BID) | ORAL | 1 refills | Status: DC

## 2017-11-23 MED ORDER — FLUTICASONE PROPIONATE 50 MCG/ACT NA SUSP: 2.0000 | Freq: Every day | NASAL | 6 refills | Status: DC

## 2017-11-23 NOTE — Assessment & Plan Note (Signed)
BMI > 35 plus GERD and DM; encouraged weight loss; she declined referral to nutritionist; she does not want the GLP-1 for weight loss and diabetes treatment

## 2017-11-23 NOTE — Assessment & Plan Note (Signed)
Well-controlled; foot exam by MD today; pt will schedule eye exam; limit sugary drinks; stop Jardiance b/c of recurrent yeast infection; increase sulfonylurea; pt politely declined GLP-1; consider Actos if needed

## 2017-11-23 NOTE — Patient Instructions (Signed)
Check out the information at familydoctor.org entitled "Nutrition for Weight Loss: What You Need to Know about Fad Diets" Try to lose between 1-2 pounds per week by taking in fewer calories and burning off more calories You can succeed by limiting portions, limiting foods dense in calories and fat, becoming more active, and drinking 8 glasses of water a day (64 ounces) Don't skip meals, especially breakfast, as skipping meals may alter your metabolism Do not use over-the-counter weight loss pills or gimmicks that claim rapid weight loss A healthy BMI (or body mass index) is between 18.5 and 24.9 You can calculate your ideal BMI at the NIH website http://www.nhlbi.nih.gov/health/educational/lose_wt/BMI/bmicalc.htm Try to limit saturated fats in your diet (bologna, hot dogs, barbeque, cheeseburgers, hamburgers, steak, bacon, sausage, cheese, etc.) and get more fresh fruits, vegetables, and whole grains Try to follow the DASH guidelines (DASH stands for Dietary Approaches to Stop Hypertension). Try to limit the sodium in your diet to no more than 1,500mg of sodium per day. Certainly try to not exceed 2,000 mg per day at the very most. Do not add salt when cooking or at the table.  Check the sodium amount on labels when shopping, and choose items lower in sodium when given a choice. Avoid or limit foods that already contain a lot of sodium. Eat a diet rich in fruits and vegetables and whole grains, and try to lose weight if overweight or obese  

## 2017-11-23 NOTE — Assessment & Plan Note (Signed)
Check lipids; continue statin, working on weight loss and walking

## 2017-11-23 NOTE — Assessment & Plan Note (Signed)
Hx of low K+; will check today; consider decreasing or stopping the HCTZ and increase CCB if needed; try DASH guidelines and weight loss

## 2017-11-23 NOTE — Progress Notes (Signed)
BP 126/80 (BP Location: Left Arm, Patient Position: Sitting, Cuff Size: Large)   Pulse 86   Temp 97.7 F (36.5 C) (Oral)   Ht 5\' 3"  (1.6 m)   Wt 201 lb 1.6 oz (91.2 kg)   SpO2 97%   BMI 35.62 kg/m    Subjective:    Patient ID: Elizabeth Scott, female    DOB: 1961-07-08, 57 y.o.   MRN: [id number]  HPI: Elizabeth Scott is a 57 y.o. female  Chief Complaint  Patient presents with  . Follow-up  . Diabetes  . Sinusitis  . Vaginitis    diabetic w/ recurrent yeast infections     HPI Patient is here for follow-up  She has type 2 diabetes; she is taking an SGLT-2 inhibitor and is unfortunately having recurrent yeast infection Last A1c was 6.8 on March 20th Cannot take metformin Already on tradjenta and glipizide Not testing sugars; does get shaky sometimes; not even every week Does not want to do an injection No problems with feet Last eye exam -- due; she will schedule an appt  She thinks she has sinusitis; she says it is on the left side; headache, pressure behind eye, down back neck, ear as well; sore throat; no fevers; taking OTC medicine, zyrtec and needs refill on the   High cholesterol; on 80 mg lipitor; increased at last visit; no muscle aches; has cut back some and going to start walking  HTN; was on 5 mg of amlodipine; currently on lisinopril/hctz, with low K+ last check  Depression screen [facility] 2/9 11/23/2017 10/17/2017 08/22/2017 03/09/2017 12/29/2016  Decreased Interest 0 0 0 0 0  Down, Depressed, Hopeless 0 0 0 0 0  PHQ - 2 Score 0 0 0 0 0   Relevant past medical, surgical, family and social history reviewed Past Medical History:  Diagnosis Date  . Allergy   . Chronic sinusitis   . Diabetes mellitus without complication (HCC)   . GERD (gastroesophageal reflux disease)   . High cholesterol   . History of abnormal cervical Pap smear    Prior to Hysterectomy, normal paps for 2-3 years after Hysterectomy  . History of gestational diabetes   .  Hypertension   . Hypopotassemia   . Microcytosis    Past Surgical History:  Procedure Laterality Date  . ABDOMINAL HYSTERECTOMY  2003   still has ovaries-for menorrhagia  . CESAREAN SECTION    . COLONOSCOPY  02/22/14   repeat 5 years  . ESOPHAGOGASTRODUODENOSCOPY  02/22/14  . TUBAL LIGATION     Family History  Problem Relation Age of Onset  . Diabetes Mother   . Heart disease Mother   . Hypertension Mother   . Stroke Mother   . Diabetes Father   . Hypertension Father   . Asthma Sister   . Diabetes Sister   . Hypertension Sister   . Lung disease Sister   . Hypertension Brother   . Heart disease Brother   . Stroke Brother   . Asthma Sister   . Diabetes Sister   . Hypertension Sister   . Congestive Heart Failure Maternal Grandmother   . Cancer Paternal Grandfather        stomach cancer  . COPD Neg Hx    Social History   Tobacco Use  . Smoking status: Never Smoker  . Smokeless tobacco: Never Used  Substance Use Topics  . Alcohol use: No  . Drug use: No   Interim medical history since last visit reviewed.  Allergies and medications reviewed  Review of Systems Per HPI unless specifically indicated above     Objective:    BP 126/80 (BP Location: Left Arm, Patient Position: Sitting, Cuff Size: Large)   Pulse 86   Temp 97.7 F (36.5 C) (Oral)   Ht 5\' 3"  (1.6 m)   Wt 201 lb 1.6 oz (91.2 kg)   SpO2 97%   BMI 35.62 kg/m   Wt Readings from Last 3 Encounters:  11/23/17 201 lb 1.6 oz (91.2 kg)  10/17/17 204 lb 4.8 oz (92.7 kg)  08/22/17 201 lb 9.6 oz (91.4 kg)    Physical Exam  Constitutional: She appears well-developed and well-nourished. No distress.  obese  HENT:  Head: Normocephalic and atraumatic.  Eyes: EOM are normal. No scleral icterus.  Neck: No thyromegaly present.  Cardiovascular: Normal rate, regular rhythm and normal heart sounds.  No murmur heard. Pulmonary/Chest: Effort normal and breath sounds normal. No respiratory distress. She has no  wheezes.  Abdominal: Soft. Bowel sounds are normal. She exhibits no distension.  Musculoskeletal: Normal range of motion. She exhibits no edema.  Neurological: She is alert. She exhibits normal muscle tone.  Skin: Skin is warm and dry. She is not diaphoretic. No pallor.  Psychiatric: She has a normal mood and affect. Her behavior is normal. Judgment and thought content normal.   Diabetic Foot Form - Detailed   Diabetic Foot Exam - detailed Diabetic Foot exam was performed with the following findings:  Yes 11/23/2017  1:18 PM  Visual Foot Exam completed.:  Yes  Pulse Foot Exam completed.:  Yes  Right Dorsalis Pedis:  Present Left Dorsalis Pedis:  Present  Sensory Foot Exam Completed.:  Yes Semmes-Weinstein Monofilament Test R Site 1-Great Toe:  Pos L Site 1-Great Toe:  Pos         Results for orders placed or performed in visit on 10/17/17  WET PREP BY MOLECULAR PROBE  Result Value Ref Range   MICRO NUMBER: [id number]    SPECIMEN QUALITY: ADEQUATE    SOURCE: VAG    STATUS: FINAL    Trichomonas vaginosis Not Detected    Gardnerella vaginalis Not Detected    Candida species Not Detected   C. trachomatis/N. gonorrhoeae RNA  Result Value Ref Range   C. trachomatis RNA, TMA NOT DETECTED NOT DETECT   N. gonorrhoeae RNA, TMA NOT DETECTED NOT DETECT  CBC with Differential/Platelet  Result Value Ref Range   WBC 10.7 3.8 - 10.8 Thousand/uL   RBC 5.29 (H) 3.80 - 5.10 Million/uL   Hemoglobin 13.3 11.7 - 15.5 g/dL   HCT 65.738.8 84.635.0 - 96.245.0 %   MCV 73.3 (L) 80.0 - 100.0 fL   MCH 25.1 (L) 27.0 - 33.0 pg   MCHC 34.3 32.0 - 36.0 g/dL   RDW 95.216.3 (H) 84.111.0 - 32.415.0 %   Platelets 267 140 - 400 Thousand/uL   MPV 12.5 7.5 - 12.5 fL   Neutro Abs 7,768 1,500 - 7,800 cells/uL   Lymphs Abs 2,044 850 - 3,900 cells/uL   WBC mixed population 653 200 - 950 cells/uL   Eosinophils Absolute 161 15 - 500 cells/uL   Basophils Absolute 75 0 - 200 cells/uL   Neutrophils Relative % 72.6 %   Total Lymphocyte  19.1 %   Monocytes Relative 6.1 %   Eosinophils Relative 1.5 %   Basophils Relative 0.7 %  COMPLETE METABOLIC PANEL WITH GFR  Result Value Ref Range   Glucose, Bld 145 (H) 65 - 99 mg/dL  BUN 15 7 - 25 mg/dL   Creat 1.61 0.96 - 0.45 mg/dL   GFR, Est Non African American 96 > OR = 60 mL/min/1.48m2   GFR, Est African American 111 > OR = 60 mL/min/1.44m2   BUN/Creatinine Ratio NOT APPLICABLE 6 - 22 (calc)   Sodium 137 135 - 146 mmol/L   Potassium 3.3 (L) 3.5 - 5.3 mmol/L   Chloride 100 98 - 110 mmol/L   CO2 30 20 - 32 mmol/L   Calcium 9.2 8.6 - 10.4 mg/dL   Total Protein 6.6 6.1 - 8.1 g/dL   Albumin 3.9 3.6 - 5.1 g/dL   Globulin 2.7 1.9 - 3.7 g/dL (calc)   AG Ratio 1.4 1.0 - 2.5 (calc)   Total Bilirubin 0.8 0.2 - 1.2 mg/dL   Alkaline phosphatase (APISO) 72 33 - 130 U/L   AST 13 10 - 35 U/L   ALT 15 6 - 29 U/L  Hemoglobin A1c  Result Value Ref Range   Hgb A1c MFr Bld 6.8 (H) <5.7 % of total Hgb   Mean Plasma Glucose 148 (calc)   eAG (mmol/L) 8.2 (calc)  Lipid panel  Result Value Ref Range   Cholesterol 185 <200 mg/dL   HDL 39 (L) >40 mg/dL   Triglycerides 981 (H) <150 mg/dL   LDL Cholesterol (Calc) 117 (H) mg/dL (calc)   Total CHOL/HDL Ratio 4.7 <5.0 (calc)   Non-HDL Cholesterol (Calc) 146 (H) <130 mg/dL (calc)  TSH  Result Value Ref Range   TSH 1.62 mIU/L  Hepatitis panel, acute  Result Value Ref Range   Hep A IgM NON-REACTIVE NON-REACTI   Hepatitis B Surface Ag NON-REACTIVE NON-REACTI   Hep B C IgM NON-REACTIVE NON-REACTI   Hepatitis C Ab NON-REACTIVE NON-REACTI   SIGNAL TO CUT-OFF 0.02 <1.00  HIV antibody  Result Value Ref Range   HIV 1&2 Ab, 4th Generation NON-REACTIVE NON-REACTI  RPR  Result Value Ref Range   RPR Ser Ql NON-REACTIVE NON-REACTI  Pap IG and HPV (high risk) DNA detection  Result Value Ref Range   Clinical Information:     LMP:     PREV. PAP:     PREV. BX:     HPV DNA Probe-Source     STATEMENT OF ADEQUACY:     INTERPRETATION/RESULT:      INFECTION:     Comment:     CYTOTECHNOLOGIST:     HPV DNA High Risk Not Detected Not Detect      Assessment & Plan:   Problem List Items Addressed This Visit      Cardiovascular and Mediastinum   Essential hypertension, benign    Hx of low K+; will check today; consider decreasing or stopping the HCTZ and increase CCB if needed; try DASH guidelines and weight loss      Relevant Medications   lisinopril-hydrochlorothiazide (PRINZIDE,ZESTORETIC) 20-25 MG tablet     Endocrine   DM type 2 (diabetes mellitus, type 2) (HCC) - Primary    Well-controlled; foot exam by MD today; pt will schedule eye exam; limit sugary drinks; stop Jardiance b/c of recurrent yeast infection; increase sulfonylurea; pt politely declined GLP-1; consider Actos if needed      Relevant Medications   glipiZIDE (GLUCOTROL XL) 10 MG 24 hr tablet   lisinopril-hydrochlorothiazide (PRINZIDE,ZESTORETIC) 20-25 MG tablet   Other Relevant Orders   Basic metabolic panel     Other   Morbid obesity (HCC)    BMI > 35 plus GERD and DM; encouraged weight loss; she declined  referral to nutritionist; she does not want the GLP-1 for weight loss and diabetes treatment      Relevant Medications   glipiZIDE (GLUCOTROL XL) 10 MG 24 hr tablet   Hypercholesterolemia    Check lipids; continue statin, working on weight loss and walking      Relevant Medications   lisinopril-hydrochlorothiazide (PRINZIDE,ZESTORETIC) 20-25 MG tablet   Other Relevant Orders   Lipid panel    Other Visit Diagnoses    Hypokalemia       Yeast infection       Relevant Medications   fluconazole (DIFLUCAN) 150 MG tablet       Follow up plan: Return in about 5 months (around 04/22/2018) for follow-up visit with Dr. Sherie Don with labs.  An after-visit summary was printed and given to the patient at check-out.  Please see the patient instructions which may contain other information and recommendations beyond what is mentioned above in the assessment and  plan.  Meds ordered this encounter  Medications  . glipiZIDE (GLUCOTROL XL) 10 MG 24 hr tablet    Sig: Take 1 tablet (10 mg total) by mouth 2 (two) times daily.    Dispense:  180 tablet    Refill:  1  . fluticasone (FLONASE) 50 MCG/ACT nasal spray    Sig: Place 2 sprays into both nostrils daily.    Dispense:  16 g    Refill:  6  . fluconazole (DIFLUCAN) 150 MG tablet    Sig: Take 1 tablet (150 mg total) by mouth once for 1 dose. Do not take cholesterol medicine for 3 days    Dispense:  1 tablet    Refill:  1  . lisinopril-hydrochlorothiazide (PRINZIDE,ZESTORETIC) 20-25 MG tablet    Sig: Take 1 tablet by mouth daily.    Dispense:  90 tablet    Refill:  1  . doxycycline (VIBRA-TABS) 100 MG tablet    Sig: Take 1 tablet (100 mg total) by mouth 2 (two) times daily.    Dispense:  20 tablet    Refill:  0    Orders Placed This Encounter  Procedures  . Basic metabolic panel  . Lipid panel

## 2017-12-02 ENCOUNTER — Other Ambulatory Visit: Payer: Self-pay | Admitting: Family Medicine

## 2017-12-03 ENCOUNTER — Other Ambulatory Visit: Payer: Self-pay | Admitting: Family Medicine

## 2017-12-03 MED ORDER — AMLODIPINE BESYLATE 5 MG PO TABS: 5.0000 mg | ORAL_TABLET | Freq: Every day | ORAL | 3 refills | Status: DC

## 2017-12-03 MED ORDER — POTASSIUM CHLORIDE CRYS ER 20 MEQ PO TBCR: 20.0000 meq | EXTENDED_RELEASE_TABLET | Freq: Every day | ORAL | 0 refills | Status: DC

## 2017-12-03 MED ORDER — LISINOPRIL 20 MG PO TABS: 20.0000 mg | ORAL_TABLET | Freq: Every day | ORAL | 3 refills | Status: DC

## 2017-12-03 NOTE — Progress Notes (Signed)
Stop the thiazide Increase the CCB Return in 2 weeks for BP check with CMA

## 2017-12-08 ENCOUNTER — Ambulatory Visit
Admission: EM | Admit: 2017-12-08 | Discharge: 2017-12-08 | Disposition: A | Discharge status: Home / Self Care | Payer: 59 | Attending: Emergency Medicine | Admitting: Emergency Medicine

## 2017-12-08 ENCOUNTER — Other Ambulatory Visit: Payer: Self-pay

## 2017-12-08 ENCOUNTER — Encounter: Payer: Self-pay | Admitting: Gynecology

## 2017-12-08 DIAGNOSIS — B309 Viral conjunctivitis, unspecified: Secondary | ICD-10-CM

## 2017-12-08 MED ORDER — KETOTIFEN FUMARATE 0.025 % OP SOLN: 1.0000 [drp] | Freq: Two times a day (BID) | OPHTHALMIC | 0 refills | Status: DC

## 2017-12-08 MED ORDER — MOXIFLOXACIN HCL 0.5 % OP SOLN: 1.0000 [drp] | Freq: Three times a day (TID) | OPHTHALMIC | 0 refills | Status: DC

## 2017-12-08 NOTE — Discharge Instructions (Signed)
Follow-up with ophthalmology if you are not improving in 2 to 3 days.

## 2017-12-08 NOTE — ED Triage Notes (Signed)
Per patient with sinus infection x 1 week ago. Patient stated notice left eye redness / drainage / mucous.

## 2017-12-08 NOTE — ED Provider Notes (Signed)
[facility]    CSN: [id number] Arrival date & time: 12/08/17  1037     History   Chief Complaint Chief Complaint  Patient presents with  . Conjunctivitis    HPI Elizabeth Scott is a 57 y.o. female.   HPI  57 year old female presents with thigh redness with drainage itchiness of grittiness mucus that she has had since yesterday.  Just returned from a trip to [facility] symptoms began.  Morning her eye was matted closed.  She has been draining clear fluid from her eye.  Her eye has been itchy.  She has no photosensitivity.  She does not wear contact lenses.  Visual acuity left 20/25 right 20/25 bilateral 20/25       Past Medical History:  Diagnosis Date  . Allergy   . Chronic sinusitis   . Diabetes mellitus without complication (HCC)   . GERD (gastroesophageal reflux disease)   . High cholesterol   . History of abnormal cervical Pap smear    Prior to Hysterectomy, normal paps for 2-3 years after Hysterectomy  . History of gestational diabetes   . Hypertension   . Hypopotassemia   . Microcytosis     Patient Active Problem List   Diagnosis Date Noted  . Anemia, iron deficiency 10/17/2017  . Breast cancer screening 09/08/2016  . Left shoulder pain 09/08/2016  . Encounter for medication monitoring 03/31/2016  . 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 [place] allergy 08/30/2015  . Preventative health care 08/30/2015  . DM type 2 (diabetes mellitus, type 2) (HCC) 04/27/2015  . Hypercholesterolemia 04/27/2015  . Essential hypertension, benign 04/27/2015  . Microcytosis 04/27/2015  . Morbid obesity (HCC) 04/27/2015  . Gastroesophageal reflux disease without esophagitis 01/08/2015    Past Surgical History:  Procedure Laterality Date  . ABDOMINAL HYSTERECTOMY  2003   still has ovaries-for menorrhagia  . CESAREAN SECTION    . COLONOSCOPY  02/22/14   repeat 5 years  . ESOPHAGOGASTRODUODENOSCOPY  02/22/14  . TUBAL LIGATION      OB  History   None      Home Medications    Prior to Admission medications   Medication Sig Start Date End Date Taking? Authorizing Provider  albuterol (PROVENTIL HFA;VENTOLIN HFA) 108 (90 BASE) MCG/ACT inhaler Inhale 2 puffs into the lungs every 2 (two) hours as needed for wheezing or shortness of breath.   Yes [provider]  amLODipine (NORVASC) 5 MG tablet Take 1 tablet (5 mg total) by mouth daily. 12/03/17  Yes Lada, Janit Bern, MD  aspirin 81 MG tablet Take 81 mg by mouth daily.   Yes [provider]  atorvastatin (LIPITOR) 80 MG tablet Take 1 tablet (80 mg total) by mouth daily. 10/18/17  Yes Lada, Janit Bern, MD  EPINEPHrine (EPIPEN 2-PAK) 0.3 mg/0.3 mL IJ SOAJ injection Inject 0.3 mLs (0.3 mg total) into the muscle once. For life-threatening emergency only 08/30/15  Yes Lada, Janit Bern, MD  fluticasone (FLONASE) 50 MCG/ACT nasal spray Place 2 sprays into both nostrils daily. 11/23/17  Yes Lada, Janit Bern, MD  glipiZIDE (GLUCOTROL XL) 10 MG 24 hr tablet Take 1 tablet (10 mg total) by mouth 2 (two) times daily. 11/23/17  Yes Lada, Janit Bern, MD  linagliptin (TRADJENTA) 5 MG TABS tablet Take 1 tablet (5 mg total) by mouth daily. 11/07/16  Yes Lada, Janit Bern, MD  lisinopril (PRINIVIL,ZESTRIL) 20 MG tablet Take 1 tablet (20 mg total) by mouth daily. 12/03/17  Yes Lada, Janit Bern,  MD  ranitidine (ZANTAC) 150 MG tablet Take 1 tablet (150 mg total) by mouth 2 (two) times daily. If needed for heartburn, reflux 02/08/17  Yes Lada, Janit Bern, MD  ketotifen (ZADITOR) 0.025 % ophthalmic solution Place 1 drop into the left eye 2 (two) times daily. 12/08/17   Lutricia Feil, [name]  moxifloxacin (VIGAMOX) 0.5 % ophthalmic solution Place 1 drop into the left eye 3 (three) times daily. 12/08/17   Lutricia Feil, [name]    Family History Family History  Problem Relation Age of Onset  . Diabetes Mother   . Heart disease Mother   . Hypertension Mother   . Stroke Mother   . Diabetes Father     . Hypertension Father   . Asthma Sister   . Diabetes Sister   . Hypertension Sister   . Lung disease Sister   . Hypertension Brother   . Heart disease Brother   . Stroke Brother   . Asthma Sister   . Diabetes Sister   . Hypertension Sister   . Congestive Heart Failure Maternal Grandmother   . Cancer Paternal Grandfather        stomach cancer  . COPD Neg Hx     Social History Social History   Tobacco Use  . Smoking status: Never Smoker  . Smokeless tobacco: Never Used  Substance Use Topics  . Alcohol use: No  . Drug use: No     Allergies   Metformin and related and Other   Review of Systems Review of Systems  Constitutional: Positive for activity change. Negative for chills, fatigue and fever.  Eyes: Positive for discharge, redness and itching. Negative for photophobia and visual disturbance.  All other systems reviewed and are negative.    Physical Exam Triage Vital Signs ED Triage Vitals  Enc Vitals Group     BP 12/08/17 1049 (!) 130/94     Pulse Rate 12/08/17 1049 75     Resp 12/08/17 1049 18     Temp 12/08/17 1049 98.3 F (36.8 C)     Temp Source 12/08/17 1049 Oral     SpO2 12/08/17 1049 99 %     Weight 12/08/17 1046 201 lb (91.2 kg)     Height --      Head Circumference --      Peak Flow --      Pain Score 12/08/17 1046 6     Pain Loc --      Pain Edu? --      Excl. in GC? --    No data found.  Updated Vital Signs BP (!) 130/94   Pulse 75   Temp 98.3 F (36.8 C) (Oral)   Resp 18   Wt 201 lb (91.2 kg)   SpO2 99%   BMI 35.61 kg/m   Visual Acuity Right Eye Distance: 20/25 Left Eye Distance: 20/25 Bilateral Distance: 20/25(with corrective lens)  Right Eye Near:   Left Eye Near:    Bilateral Near:     Physical Exam  Constitutional: She is oriented to person, place, and time. She appears well-developed and well-nourished. No distress.  HENT:  Head: Normocephalic.  Eyes: Pupils are equal, round, and reactive to light. EOM are  normal. Right eye exhibits no discharge. Left eye exhibits discharge.  Examination of the left eye shows the patient to have erythema of the conjunctivo-.  PERRLA, EOMs are intact.  There is no ciliary flush present.  Left eye was anesthetized with tetracaine.  Examination under the  upper lid by eversion showed no foreign body seen.Fluorescein stain was then applied and the eye examined under magnification of a Woods lamp.  No lesions of the cornea.  Does exhibit clear watery discharge from the eye  Neck: Normal range of motion.  Musculoskeletal: Normal range of motion.  Neurological: She is alert and oriented to person, place, and time.  Skin: Skin is warm and dry. She is not diaphoretic.  Psychiatric: She has a normal mood and affect. Her behavior is normal. Judgment and thought content normal.  Nursing note and vitals reviewed.    UC Treatments / Results  Labs (all labs ordered are listed, but only abnormal results are displayed) Labs Reviewed - No data to display  EKG None  Radiology No results found.  Procedures Procedures (including critical care time)  Medications Ordered in UC Medications - No data to display  Initial Impression / Assessment and Plan / UC Course  I have reviewed the triage vital signs and the nursing notes.  Pertinent labs & imaging results that were available during my care of the patient were reviewed by me and considered in my medical decision making (see chart for details).     Plan: 1. Test/x-ray results and diagnosis reviewed with patient 2. rx as per orders; risks, benefits, potential side effects reviewed with patient 3. Recommend supportive treatment with cool compresses as necessary for comfort.  Use eyedrops as directed.  You are not improving in 2 to 3 days recommend follow-up with ophthalmology. 4. F/u prn if symptoms worsen or don't improve  Final Clinical Impressions(s) / UC Diagnoses   Final diagnoses:  Acute viral conjunctivitis of  left eye     Discharge Instructions     Follow-up with ophthalmology if you are not improving in 2 to 3 days.   ED Prescriptions    Medication Sig Dispense Auth. Provider   moxifloxacin (VIGAMOX) 0.5 % ophthalmic solution Place 1 drop into the left eye 3 (three) times daily. 3 mL Lutricia Feil, [name]   ketotifen (ZADITOR) 0.025 % ophthalmic solution Place 1 drop into the left eye 2 (two) times daily. 5 mL Lutricia Feil, [name]     Controlled Substance Prescriptions [place] Controlled Substance Registry consulted? Not Applicable   Lutricia Feil, [name] 12/08/17 1143

## 2017-12-10 DIAGNOSIS — H11422 Conjunctival edema, left eye: Secondary | ICD-10-CM | POA: Diagnosis not present

## 2017-12-17 ENCOUNTER — Telehealth: Payer: Self-pay | Admitting: Family Medicine

## 2017-12-17 NOTE — Telephone Encounter (Signed)
Copied from CRM [id number]. Topic: General - Other >> Dec 17, 2017  3:54 PM Marylen Ponto wrote: Reason for CRM: Pt requesting to speak with a nurse. Pt states she was taken off the lisinopril and now her feet are starting to swell. Pt states she has noticed this for the last 3-4 days. Cb# [id number]

## 2017-12-18 NOTE — Telephone Encounter (Signed)
I don't know who took her off of lisinopril I tried to ca

## 2017-12-18 NOTE — Telephone Encounter (Signed)
Computer dropped me I tried to call patient, no answer at home number Patient says the lisinopril has not been stopped The HCTZ was stopped because of low K+ During the day they will swell and then go back down overnight I recommend compression stockings; 20-30 mmHg would be ideal, but 18 mmHg okay if that's more comfortable and she'll wear them She agrees

## 2017-12-18 NOTE — Telephone Encounter (Signed)
Voice mailbox is not set up.   No message left.

## 2018-01-04 ENCOUNTER — Other Ambulatory Visit: Payer: Self-pay | Admitting: Family Medicine

## 2018-03-08 ENCOUNTER — Other Ambulatory Visit: Payer: Self-pay | Admitting: Family Medicine

## 2018-04-30 ENCOUNTER — Other Ambulatory Visit: Payer: Self-pay | Admitting: Family Medicine

## 2018-05-02 ENCOUNTER — Ambulatory Visit: Payer: 59 | Admitting: Family Medicine

## 2018-05-07 ENCOUNTER — Ambulatory Visit: Payer: 59 | Admitting: Family Medicine

## 2018-06-16 ENCOUNTER — Other Ambulatory Visit: Payer: Self-pay | Admitting: Family Medicine

## 2018-06-16 NOTE — Telephone Encounter (Signed)
Tradjenta requested; denied; too soon; 90 day supply approved 04/30/18 to same local pharmacy -------------------------------------------- Patient is a few months overdue for her visit Please ask her to schedule ASAP for diabetes, high cholesterol

## 2018-06-17 ENCOUNTER — Other Ambulatory Visit: Payer: Self-pay | Admitting: Family Medicine

## 2018-06-17 NOTE — Telephone Encounter (Signed)
Spoke with pt and she did schedule appt with Dr Sherie DonLada for 11.26.19. Pt stated that she did get her meds in October but the pharmacy only gave her a 30 day supply because they did not have any in. Please advise

## 2018-06-17 NOTE — Telephone Encounter (Signed)
[name], please resolve the Tradjenta with pharmacy If I prescribe a 90 day supply and they only have 30 pills there, they still owe the patient 60 more pills

## 2018-06-18 ENCOUNTER — Other Ambulatory Visit: Payer: Self-pay | Admitting: Family Medicine

## 2018-06-18 NOTE — Telephone Encounter (Signed)
Recalled in and pt notified

## 2018-06-18 NOTE — Telephone Encounter (Signed)
I apologize. Patient went to the pharmacy last night and they advised her that the prescription was canceled.

## 2018-06-18 NOTE — Telephone Encounter (Signed)
Patient said she went to the pharmacy night and they advised her that the script was canceled. She said so therefore she can not get her remaining 60 tablets. Please advise.

## 2018-06-25 ENCOUNTER — Ambulatory Visit (INDEPENDENT_AMBULATORY_CARE_PROVIDER_SITE_OTHER): Payer: 59 | Admitting: Family Medicine

## 2018-06-25 ENCOUNTER — Encounter: Payer: Self-pay | Admitting: Family Medicine

## 2018-06-25 VITALS — BP 142/86 | HR 92 | Temp 97.9°F | Ht 63.0 in | Wt 211.5 lb

## 2018-06-25 DIAGNOSIS — R0982 Postnasal drip: Secondary | ICD-10-CM | POA: Diagnosis not present

## 2018-06-25 DIAGNOSIS — M25512 Pain in left shoulder: Secondary | ICD-10-CM

## 2018-06-25 DIAGNOSIS — J343 Hypertrophy of nasal turbinates: Secondary | ICD-10-CM | POA: Diagnosis not present

## 2018-06-25 DIAGNOSIS — G8929 Other chronic pain: Secondary | ICD-10-CM

## 2018-06-25 DIAGNOSIS — E7800 Pure hypercholesterolemia, unspecified: Secondary | ICD-10-CM | POA: Diagnosis not present

## 2018-06-25 DIAGNOSIS — J3489 Other specified disorders of nose and nasal sinuses: Secondary | ICD-10-CM

## 2018-06-25 DIAGNOSIS — E119 Type 2 diabetes mellitus without complications: Secondary | ICD-10-CM | POA: Diagnosis not present

## 2018-06-25 DIAGNOSIS — Z5181 Encounter for therapeutic drug level monitoring: Secondary | ICD-10-CM | POA: Diagnosis not present

## 2018-06-25 MED ORDER — GLIPIZIDE ER 10 MG PO TB24: 10.0000 mg | ORAL_TABLET | Freq: Two times a day (BID) | ORAL | 1 refills | Status: DC

## 2018-06-25 MED ORDER — FLUTICASONE PROPIONATE 50 MCG/ACT NA SUSP: 2.0000 | Freq: Every day | NASAL | 3 refills | Status: DC

## 2018-06-25 MED ORDER — CYCLOBENZAPRINE HCL 5 MG PO TABS: 5.0000 mg | ORAL_TABLET | Freq: Three times a day (TID) | ORAL | 0 refills | Status: DC | PRN

## 2018-06-25 MED ORDER — ATORVASTATIN CALCIUM 40 MG PO TABS: 80.0000 mg | ORAL_TABLET | Freq: Every day | ORAL | 1 refills | Status: DC

## 2018-06-25 MED ORDER — LOSARTAN POTASSIUM 50 MG PO TABS: 50.0000 mg | ORAL_TABLET | Freq: Every day | ORAL | 3 refills | Status: DC

## 2018-06-25 MED ORDER — FAMOTIDINE 20 MG PO TABS: 20.0000 mg | ORAL_TABLET | Freq: Every day | ORAL | 3 refills | Status: DC

## 2018-06-25 NOTE — Assessment & Plan Note (Addendum)
enoucragement given; suggested nutritionist and she will talk to someone at her job about meal planning and mindful eating; see AVS; offered GLP-1 but she does not want needles; BMI >35 with comorbidities HTN, diabetes

## 2018-06-25 NOTE — Progress Notes (Signed)
BP (!) 142/86   Pulse 92   Temp 97.9 F (36.6 C) (Oral)   Ht 5\' 3"  (1.6 m)   Wt 211 lb 8 oz (95.9 kg)   SpO2 98%   BMI 37.47 kg/m    Subjective:    Patient ID: Elizabeth Scott, female    DOB: Dec 19, 1960, 57 y.o.   MRN: [id number]  HPI: Elizabeth Scott is a 57 y.o. female  Chief Complaint  Patient presents with  . Follow-up  . Medication Refill  . Sinusitis    headache, blood when blow nose with cough  . Shoulder Pain    left has flared back up, wants to see about getting muscle relaxer    HPI  Here for f/u  She has been taking ranitidine; burning and burping; no blood in the stool; no hx of ulcer; ; does not drink much orange juice; controls it with the medicine  Sinusitis; needs refills of the nasal corticosteroid; year-round, never gets rid of it; left side is stopped up all the time, even when she sprays the stuff up, does not go up  Hypertension; on lisinopril 20 mg daily and amlodipine 5 mg  Type 2 diabetes; not checking FSBS so she doesn't know; feels some dry mouth and a little off some days; not sure if low or high; she is not interested in a meter or checking at home  Lab Results  Component Value Date   HGBA1C 7.6 (H) 06/25/2018   High cholesterol; on high dose atorvastatin; she was on the 40 mg before, then switched to 80 mg but it is huge; she would like to be on two 40 mg pills; last LDL was 95; goal less than 70; she does not eat much bologna; occasional hot dogs; not much fried foods; not much exercise; not sure how many steps she gets a day  She is having issues with her shoulder; chronic; flares up with lifting, LEFT side; shoulder to the shoulderblade to the middle of her back; uses ibuprofen and tylenol; gets better after a while; someone suggested muscle relaxer because she gets spasms; she lifts her arm up and it catches and she has to stop until it passes; she has not done PT  Morbid obesity; after stopping jardiance, appetite really  increased; offered nutritionist and she'll see nutritionist at work  Depression screen [facility] 2/9 06/25/2018 11/23/2017 10/17/2017 08/22/2017 03/09/2017  Decreased Interest 0 0 0 0 0  Down, Depressed, Hopeless 0 0 0 0 0  PHQ - 2 Score 0 0 0 0 0  Altered sleeping 0 - - - -  Tired, decreased energy 0 - - - -  Change in appetite 0 - - - -  Feeling bad or failure about yourself  0 - - - -  Trouble concentrating 0 - - - -  Moving slowly or fidgety/restless 0 - - - -  Suicidal thoughts 0 - - - -  PHQ-9 Score 0 - - - -  Difficult doing work/chores Not difficult at all - - - -   Fall Risk  06/25/2018 11/23/2017 10/17/2017 08/22/2017 03/09/2017  Falls in the past year? 0 No No No Yes  Number falls in past yr: 0 - - - 1  Injury with Fall? - - - - Yes  Follow up - - - - Education provided    Relevant past medical, surgical, family and social history reviewed Past Medical History:  Diagnosis Date  . Allergy   . Chronic sinusitis   .  Diabetes mellitus without complication (HCC)   . GERD (gastroesophageal reflux disease)   . High cholesterol   . History of abnormal cervical Pap smear    Prior to Hysterectomy, normal paps for 2-3 years after Hysterectomy  . History of gestational diabetes   . Hypertension   . Hypopotassemia   . Microcytosis    Past Surgical History:  Procedure Laterality Date  . ABDOMINAL HYSTERECTOMY  2003   still has ovaries-for menorrhagia  . CESAREAN SECTION    . COLONOSCOPY  02/22/14   repeat 5 years  . ESOPHAGOGASTRODUODENOSCOPY  02/22/14  . TUBAL LIGATION     Family History  Problem Relation Age of Onset  . Diabetes Mother   . Heart disease Mother   . Hypertension Mother   . Stroke Mother   . Diabetes Father   . Hypertension Father   . Asthma Sister   . Diabetes Sister   . Hypertension Sister   . Lung disease Sister   . Hypertension Brother   . Heart disease Brother   . Stroke Brother   . Asthma Sister   . Diabetes Sister   . Hypertension Sister   .  Congestive Heart Failure Maternal Grandmother   . Cancer Paternal Grandfather        stomach cancer  . COPD Neg Hx    Social History   Tobacco Use  . Smoking status: Never Smoker  . Smokeless tobacco: Never Used  Substance Use Topics  . Alcohol use: No  . Drug use: No     Office Visit from 06/25/2018 in [facility]  AUDIT-C Score  0      Interim medical history since last visit reviewed. Allergies and medications reviewed  Review of Systems Per HPI unless specifically indicated above     Objective:    BP (!) 142/86   Pulse 92   Temp 97.9 F (36.6 C) (Oral)   Ht 5\' 3"  (1.6 m)   Wt 211 lb 8 oz (95.9 kg)   SpO2 98%   BMI 37.47 kg/m   Wt Readings from Last 3 Encounters:  06/25/18 211 lb 8 oz (95.9 kg)  12/08/17 201 lb (91.2 kg)  11/23/17 201 lb 1.6 oz (91.2 kg)    Physical Exam  Constitutional: She appears well-developed and well-nourished. No distress.  HENT:  Head: Normocephalic and atraumatic.  Eyes: EOM are normal. No scleral icterus.  Neck: No thyromegaly present.  Cardiovascular: Normal rate, regular rhythm and normal heart sounds.  No murmur heard. Pulmonary/Chest: Effort normal and breath sounds normal. No respiratory distress. She has no wheezes.  Abdominal: Soft. Bowel sounds are normal. She exhibits no distension.  Musculoskeletal: She exhibits no edema.  Neurological: She is alert.  Skin: Skin is warm and dry. She is not diaphoretic. No pallor.  Psychiatric: She has a normal mood and affect. Her behavior is normal. Judgment and thought content normal.   Diabetic Foot Form - Detailed   Diabetic Foot Exam - detailed Diabetic Foot exam was performed with the following findings:  Yes 06/25/2018  9:55 AM  Visual Foot Exam completed.:  Yes  Pulse Foot Exam completed.:  Yes  Right Dorsalis Pedis:  Present Left Dorsalis Pedis:  Present  Sensory Foot Exam Completed.:  Yes Semmes-Weinstein Monofilament Test R Site 1-Great Toe:  Pos L  Site 1-Great Toe:  Pos           Assessment & Plan:   Problem List Items Addressed This Visit  Endocrine   DM type 2 (diabetes mellitus, type 2) (HCC) - Primary    Check A1c; work on weight loss; better eating recommedned      Relevant Medications   glipiZIDE (GLUCOTROL XL) 10 MG 24 hr tablet   losartan (COZAAR) 50 MG tablet   atorvastatin (LIPITOR) 40 MG tablet   Other Relevant Orders   Hemoglobin A1C (Completed)   Urine Microalbumin w/creat. ratio (Completed)     Other   Encounter for medication monitoring   Relevant Orders   COMPLETE METABOLIC PANEL WITH GFR (Completed)   Hypercholesterolemia   Relevant Medications   losartan (COZAAR) 50 MG tablet   atorvastatin (LIPITOR) 40 MG tablet   Other Relevant Orders   Lipid panel (Completed)   Left shoulder pain    Offered/recommended PT, but she declined and will talk to therapist at her work; muscle relaxant      Morbid obesity (HCC)    enoucragement given; suggested nutritionist and she will talk to someone at her job about meal planning and mindful eating; see AVS; offered GLP-1 but she does not want needles; BMI >35 with comorbidities HTN, diabetes      Relevant Medications   glipiZIDE (GLUCOTROL XL) 10 MG 24 hr tablet    Other Visit Diagnoses    Nasal turbinate hypertrophy       Relevant Orders   Ambulatory referral to ENT   Postnasal drip       Relevant Orders   Ambulatory referral to ENT   Nasal obstruction       Relevant Orders   Ambulatory referral to ENT       Follow up plan: Return in about 3 months (around 09/25/2018) for follow-up visit with Dr. Sherie DonLada; return in 7-10 days to see CMA and have BMP.  An after-visit summary was printed and given to the patient at check-out.  Please see the patient instructions which may contain other information and recommendations beyond what is mentioned above in the assessment and plan.  Meds ordered this encounter  Medications  . glipiZIDE (GLUCOTROL XL) 10  MG 24 hr tablet    Sig: Take 1 tablet (10 mg total) by mouth 2 (two) times daily.    Dispense:  180 tablet    Refill:  1  . fluticasone (FLONASE) 50 MCG/ACT nasal spray    Sig: Place 2 sprays into both nostrils daily.    Dispense:  18 g    Refill:  3  . famotidine (PEPCID) 20 MG tablet    Sig: Take 1 tablet (20 mg total) by mouth daily. For heartburn    Dispense:  90 tablet    Refill:  3    This replaces ranitidine; cancel that Rx  . losartan (COZAAR) 50 MG tablet    Sig: Take 1 tablet (50 mg total) by mouth daily. For blood pressure and to protect kidneys; replaces lisinopril    Dispense:  90 tablet    Refill:  3    CANCEL lisinopril; switching from ACE-I to ARB  . atorvastatin (LIPITOR) 40 MG tablet    Sig: Take 2 tablets (80 mg total) by mouth at bedtime.    Dispense:  180 tablet    Refill:  1    We're using two smaller 40 mg pills instead of the big 80 mg pill  . cyclobenzaprine (FLEXERIL) 5 MG tablet    Sig: Take 1 tablet (5 mg total) by mouth every 8 (eight) hours as needed for muscle spasms. Do not drive  for 8 hours after taking    Dispense:  21 tablet    Refill:  0    Orders Placed This Encounter  Procedures  . Hemoglobin A1C  . Urine Microalbumin w/creat. ratio  . COMPLETE METABOLIC PANEL WITH GFR  . Lipid panel  . Ambulatory referral to ENT

## 2018-06-25 NOTE — Assessment & Plan Note (Signed)
Offered/recommended PT, but she declined and will talk to therapist at her work; muscle relaxant

## 2018-06-25 NOTE — Patient Instructions (Addendum)
We'll have you see the Ear Nose Throat doctor Do some exercises and stretching for the left shoulder Check out the information at familydoctor.org entitled "Nutrition for Weight Loss: What You Need to Know about Fad Diets" Try to lose between 1-2 pounds per week by taking in fewer calories and burning off more calories You can succeed by limiting portions, limiting foods dense in calories and fat, becoming more active, and drinking 8 glasses of water a day (64 ounces) Don't skip meals, especially breakfast, as skipping meals may alter your metabolism Do not use over-the-counter weight loss pills or gimmicks that claim rapid weight loss A healthy BMI (or body mass index) is between 18.5 and 24.9 You can calculate your ideal BMI at the NIH website [employer]huhttp://www.nhlbi.nih.gov/health/educational/lose_wt/BMI/bmicalc.htm We'll get labs today If you have not heard anything from my staff in a week about any orders/referrals/studies from today, please contact us here to follow-up [phone]

## 2018-06-26 ENCOUNTER — Other Ambulatory Visit: Payer: Self-pay | Admitting: Family Medicine

## 2018-06-26 LAB — COMPLETE METABOLIC PANEL WITH GFR
AG Ratio: 1.9 (calc) (ref 1.0–2.5)
ALBUMIN MSPROF: 4.1 g/dL (ref 3.6–5.1)
ALT: 15 U/L (ref 6–29)
AST: 11 U/L (ref 10–35)
Alkaline phosphatase (APISO): 80 U/L (ref 33–130)
BUN: 14 mg/dL (ref 7–25)
CO2: 28 mmol/L (ref 20–32)
CREATININE: 0.63 mg/dL (ref 0.50–1.05)
Calcium: 9.4 mg/dL (ref 8.6–10.4)
Chloride: 104 mmol/L (ref 98–110)
GFR, EST AFRICAN AMERICAN: 116 mL/min/{1.73_m2} (ref >=60)
GFR, Est Non African American: 100 mL/min/{1.73_m2} (ref >=60)
Globulin: 2.2 g/dL (calc) (ref 1.9–3.7)
Glucose, Bld: 143 mg/dL — ABNORMAL HIGH (ref 65–99)
Potassium: 4 mmol/L (ref 3.5–5.3)
Sodium: 138 mmol/L (ref 135–146)
TOTAL PROTEIN: 6.3 g/dL (ref 6.1–8.1)
Total Bilirubin: 0.6 mg/dL (ref 0.2–1.2)

## 2018-06-26 LAB — MICROALBUMIN / CREATININE URINE RATIO
CREATININE, URINE: 129 mg/dL (ref 20–275)
MICROALB UR: 0.7 mg/dL
MICROALB/CREAT RATIO: 5 ug/mg{creat} (ref <30)

## 2018-06-26 LAB — HEMOGLOBIN A1C
Hgb A1c MFr Bld: 7.6 % of total Hgb — ABNORMAL HIGH (ref <5.7)
MEAN PLASMA GLUCOSE: 171 (calc)
eAG (mmol/L): 9.5 (calc)

## 2018-06-26 LAB — LIPID PANEL
CHOL/HDL RATIO: 3.7 (calc) (ref <5.0)
Cholesterol: 160 mg/dL (ref <200)
HDL: 43 mg/dL — ABNORMAL LOW (ref >50)
LDL CHOLESTEROL (CALC): 97 mg/dL
Non-HDL Cholesterol (Calc): 117 mg/dL (calc) (ref <130)
TRIGLYCERIDES: 103 mg/dL (ref <150)

## 2018-06-26 MED ORDER — EZETIMIBE 10 MG PO TABS: 10.0000 mg | ORAL_TABLET | Freq: Every day | ORAL | 11 refills | Status: DC

## 2018-06-26 MED ORDER — PIOGLITAZONE HCL 15 MG PO TABS: 15.0000 mg | ORAL_TABLET | Freq: Every day | ORAL | 5 refills | Status: DC

## 2018-06-26 NOTE — Progress Notes (Signed)
Add zetia 

## 2018-06-26 NOTE — Progress Notes (Signed)
Add actos

## 2018-06-29 NOTE — Assessment & Plan Note (Signed)
Check A1c; work on weight loss; better eating recommedned

## 2018-07-04 DIAGNOSIS — J329 Chronic sinusitis, unspecified: Secondary | ICD-10-CM | POA: Diagnosis not present

## 2018-07-04 DIAGNOSIS — J342 Deviated nasal septum: Secondary | ICD-10-CM | POA: Diagnosis not present

## 2018-07-04 DIAGNOSIS — J3489 Other specified disorders of nose and nasal sinuses: Secondary | ICD-10-CM | POA: Diagnosis not present

## 2018-07-04 DIAGNOSIS — J301 Allergic rhinitis due to pollen: Secondary | ICD-10-CM | POA: Diagnosis not present

## 2018-07-05 ENCOUNTER — Ambulatory Visit: Payer: 59

## 2018-07-08 ENCOUNTER — Ambulatory Visit: Payer: 59

## 2018-07-08 VITALS — BP 136/78 | HR 95

## 2018-07-08 DIAGNOSIS — I10 Essential (primary) hypertension: Secondary | ICD-10-CM

## 2018-07-08 DIAGNOSIS — Z5181 Encounter for therapeutic drug level monitoring: Secondary | ICD-10-CM | POA: Diagnosis not present

## 2018-07-08 LAB — BASIC METABOLIC PANEL WITH GFR
BUN: 14 mg/dL (ref 7–25)
CALCIUM: 8.9 mg/dL (ref 8.6–10.4)
CO2: 27 mmol/L (ref 20–32)
Chloride: 101 mmol/L (ref 98–110)
Creat: 0.71 mg/dL (ref 0.50–1.05)
GFR, EST AFRICAN AMERICAN: 110 mL/min/{1.73_m2} (ref >=60)
GFR, EST NON AFRICAN AMERICAN: 95 mL/min/{1.73_m2} (ref >=60)
Glucose, Bld: 192 mg/dL — ABNORMAL HIGH (ref 65–139)
POTASSIUM: 4 mmol/L (ref 3.5–5.3)
Sodium: 136 mmol/L (ref 135–146)

## 2018-07-08 NOTE — Progress Notes (Signed)
Patient here for blood pressure re check since starting on Losartan.  Patient denies any side effects, blood pressure today is 136/78 and pulse 95. We will continue current regimen  Per Dr. Sherie DonLada.

## 2018-07-09 NOTE — Progress Notes (Signed)
Elizabeth BradfordKimberly, please let the patient know that her kidney function is fine, and she is tolerating the switch of her BP medicines well; her sugar was higher than we want, so we'll encourage her to watch her diet and work on weight loss

## 2018-07-17 DIAGNOSIS — J342 Deviated nasal septum: Secondary | ICD-10-CM | POA: Diagnosis not present

## 2018-07-23 DIAGNOSIS — J301 Allergic rhinitis due to pollen: Secondary | ICD-10-CM | POA: Diagnosis not present

## 2018-07-23 DIAGNOSIS — J342 Deviated nasal septum: Secondary | ICD-10-CM | POA: Diagnosis not present

## 2018-07-23 DIAGNOSIS — J329 Chronic sinusitis, unspecified: Secondary | ICD-10-CM | POA: Diagnosis not present

## 2018-08-13 ENCOUNTER — Encounter: Payer: Self-pay | Admitting: Family Medicine

## 2018-08-13 ENCOUNTER — Ambulatory Visit (INDEPENDENT_AMBULATORY_CARE_PROVIDER_SITE_OTHER): Payer: 59 | Admitting: Family Medicine

## 2018-08-13 VITALS — BP 136/82 | HR 85 | Temp 98.6°F | Ht 63.0 in | Wt 218.2 lb

## 2018-08-13 DIAGNOSIS — M25461 Effusion, right knee: Secondary | ICD-10-CM | POA: Diagnosis not present

## 2018-08-13 DIAGNOSIS — M25561 Pain in right knee: Secondary | ICD-10-CM

## 2018-08-13 DIAGNOSIS — M7121 Synovial cyst of popliteal space [Baker], right knee: Secondary | ICD-10-CM

## 2018-08-13 NOTE — Patient Instructions (Addendum)
Treat symptomatically for now until we can get you in to see the orthopaedist  Baker Cyst A Baker cyst, also called a popliteal cyst, is a sac-like growth that forms at the back of the knee. The cyst forms when the fluid-filled sac (bursa) that cushions the knee joint becomes enlarged. The bursa that becomes a Baker cyst is located at the back of the knee joint. What are the causes? In most cases, a Baker cyst results from another knee problem that causes swelling inside the knee. This makes the fluid inside the knee joint (synovial fluid) flow into the bursa behind the knee, causing the bursa to enlarge. What increases the risk? You may be more likely to develop a Baker cyst if you already have a knee problem, such as:  A tear in cartilage that cushions the knee joint (meniscal tear).  A tear in the tissues that connect the bones of the knee joint (ligament tear).  Knee swelling from osteoarthritis, rheumatoid arthritis, or gout. What are the signs or symptoms? A Baker cyst does not always cause symptoms. A lump behind the knee may be the only sign of the condition. The lump may be painful, especially when the knee is straightened. If the lump is painful, the pain may come and go. The knee may also be stiff. Symptoms may quickly get more severe if the cyst breaks open (ruptures). If your cyst ruptures, signs and symptoms may affect the knee and the back of the lower leg (calf) and may include:  Sudden or worsening pain.  Swelling.  Bruising. How is this diagnosed? This condition may be diagnosed based on your symptoms and medical history. Your health care provider will also do a physical exam. This may include:  Feeling the cyst to check whether it is tender.  Checking your knee for signs of another knee condition that causes swelling. You may have imaging tests, such as:  X-rays.  MRI.  Ultrasound. How is this treated? A Baker cyst that is not painful may go away without  treatment. If the cyst gets large or painful, it will likely get better if the underlying knee problem is treated. Treatment for a Baker cyst may include:  Resting.  Keeping weight off of the knee. This means not leaning on the knee to support your body weight.  NSAIDs to reduce pain and swelling.  A procedure to drain the fluid from the cyst with a needle (aspiration). You may also get an injection of a medicine that reduces swelling (steroid).  Surgery. This may be needed if other treatments do not work. This usually involves correcting knee damage and removing the cyst. Follow these instructions at home:   Take over-the-counter and prescription medicines only as told by your health care provider.  Rest and return to your normal activities as told by your health care provider. Avoid activities that make pain or swelling worse. Ask your health care provider what activities are safe for you.  Keep all follow-up visits as told by your health care provider. This is important. Contact a health care provider if:  You have knee pain, stiffness, or swelling that does not get better. Get help right away if:  You have sudden or worsening pain and swelling in your calf area. This information is not intended to replace advice given to you by your health care provider. Make sure you discuss any questions you have with your health care provider. Document Released: 07/17/2005 Document Revised: 04/06/2016 Document Reviewed: 04/06/2016 Elsevier Interactive  Patient Education  2019 [employer].

## 2018-08-13 NOTE — Progress Notes (Signed)
BP 136/82   Pulse 85   Temp 98.6 F (37 C)   Ht 5\' 3"  (1.6 m)   Wt 218 lb 3.2 oz (99 kg)   SpO2 96%   BMI 38.65 kg/m    Subjective:    Patient ID: Elizabeth Gloweresa Elaine Magri, female    DOB: 03/11/1961, 58 y.o.   MRN: [id number]  HPI: Elizabeth Scott is a 58 y.o. female  Chief Complaint  Patient presents with  . Cyst    onset 2 weeks, right leg behind knee    HPI Patient is here for an acute visit; she is coming back for chronic health issues soon She has a little pocket behind the right knee; bothering her for about two weeks Feels better and then comes back, comes and goes She cannot bend it all the way by herself Hurts no matter what; propped up, bending, sitting, anything causes pain Nurse looked at it, thought it might have fluid Does not feel excruciating to the touch and not hot No injury recalled No fluid pockets in the front of the knee She has tried heat and cold, ibuprofen, tylenol   [facility] 2[facility] 08/13/2018 06/25/2018 11/23/2017 10/17/2017 08/22/2017  Decreased Interest 0 0 0 0 0  Down, Depressed, Hopeless 0 0 0 0 0  PHQ - 2 Score 0 0 0 0 0  Altered sleeping 0 0 - - -  Tired, decreased energy 0 0 - - -  Change in appetite 0 0 - - -  Feeling bad or failure about yourself  0 0 - - -  Trouble concentrating 0 0 - - -  Moving slowly or fidgety/restless 0 0 - - -  Suicidal thoughts 0 0 - - -  PHQ-9 Score 0 0 - - -  Difficult doing work/chores Not difficult at all Not difficult at all - - -   Fall Risk  08/13/2018 06/25/2018 11/23/2017 10/17/2017 08/22/2017  Falls in the past year? 0 0 No No No  Number falls in past yr: 0 0 - - -  Injury with Fall? 0 - - - -  Follow up - - - - -    Relevant past medical, surgical, family and social history reviewed Past Medical History:  Diagnosis Date  . Allergy   . Chronic sinusitis   . Diabetes mellitus without complication (HCC)   . GERD (gastroesophageal reflux disease)   . High cholesterol   . History  of abnormal cervical Pap smear    Prior to Hysterectomy, normal paps for 2-3 years after Hysterectomy  . History of gestational diabetes   . Hypertension   . Hypopotassemia   . Microcytosis    Past Surgical History:  Procedure Laterality Date  . ABDOMINAL HYSTERECTOMY  2003   still has ovaries-for menorrhagia  . CESAREAN SECTION    . COLONOSCOPY  02/22/14   repeat 5 years  . ESOPHAGOGASTRODUODENOSCOPY  02/22/14  . TUBAL LIGATION     Family History  Problem Relation Age of Onset  . Diabetes Mother   . Heart disease Mother   . Hypertension Mother   . Stroke Mother   . Diabetes Father   . Hypertension Father   . Asthma Sister   . Diabetes Sister   . Hypertension Sister   . Lung disease Sister   . Hypertension Brother   . Heart disease Brother   . Stroke Brother   . Asthma Sister   . Diabetes Sister   . Hypertension Sister   .  Congestive Heart Failure Maternal Grandmother   . Cancer Paternal Grandfather        stomach cancer  . COPD Neg Hx    Social History   Tobacco Use  . Smoking status: Never Smoker  . Smokeless tobacco: Never Used  Substance Use Topics  . Alcohol use: No  . Drug use: No     [facility]  AUDIT-C Score  0      Interim medical history since last visit reviewed. Allergies and medications reviewed  Review of Systems Per HPI unless specifically indicated above     Objective:    BP 136/82   Pulse 85   Temp 98.6 F (37 C)   Ht 5\' 3"  (1.6 m)   Wt 218 lb 3.2 oz (99 kg)   SpO2 96%   BMI 38.65 kg/m   Wt Readings from Last 3 Encounters:  08/13/18 218 lb 3.2 oz (99 kg)  06/25/18 211 lb 8 oz (95.9 kg)  12/08/17 201 lb (91.2 kg)    Physical Exam Constitutional:      General: She is not in acute distress.    Appearance: She is well-developed. She is obese.  Eyes:     General: No scleral icterus. Neck:     Thyroid: No thyromegaly.  Cardiovascular:     Rate and Rhythm: Normal rate.    Pulmonary:     Effort: Pulmonary effort is normal.  Abdominal:     General: There is no distension.  Musculoskeletal:     Right knee: She exhibits decreased range of motion and swelling (posteriorly). She exhibits no effusion, no ecchymosis, no deformity and no erythema. Tenderness found. Medial joint line tenderness noted.       Legs:     Comments: Palpable swelling posterior RIGHT knee in the popliteal fossa; no calf tenderness, no erythema or swelling of the calf  Skin:    Coloration: Skin is not pale.  Psychiatric:        Mood and Affect: Mood is not anxious.    Diabetic Foot Form - Detailed   Diabetic Foot Exam - detailed Diabetic Foot exam was performed with the following findings:  Yes 08/13/2018 11:16 AM  Visual Foot Exam completed.:  Yes  Pulse Foot Exam completed.:  Yes  Right Dorsalis Pedis:  Present Left Dorsalis Pedis:  Present  Sensory Foot Exam Completed.:  Yes Semmes-Weinstein Monofilament Test R Site 1-Great Toe:  Pos L Site 1-Great Toe:  Pos           Assessment & Plan:   Problem List Items Addressed This Visit    None    Visit Diagnoses    Baker's cyst of knee, right    -  Primary   believe this is a baker's cyst of the right knee; will confirm with US and refer to ortho; treat conservatively   Relevant Orders   Ambulatory referral to Orthopedics   US RT LOWER EXTREM LTD SOFT TISSUE NON VASCULAR   Posterior right knee pain       Relevant Orders   Ambulatory referral to Orthopedics   US RT LOWER EXTREM LTD SOFT TISSUE NON VASCULAR   Pain and swelling of right knee       Relevant Orders   Ambulatory referral to Orthopedics   US RT LOWER EXTREM LTD SOFT TISSUE NON VASCULAR       Follow up plan: No follow-ups on file.  An after-visit summary was printed and  given to the patient at check-out.  Please see the patient instructions which may contain other information and recommendations beyond what is mentioned above in the assessment and plan.  No  orders of the defined types were placed in this encounter.   Orders Placed This Encounter  Procedures  . [place] RT LOWER EXTREM LTD SOFT TISSUE NON VASCULAR  . Ambulatory referral to Orthopedics

## 2018-08-16 ENCOUNTER — Ambulatory Visit
Admission: RE | Admit: 2018-08-16 | Discharge: 2018-08-16 | Disposition: A | Discharge status: Home / Self Care | Payer: 59 | Source: Ambulatory Visit | Attending: Family Medicine | Admitting: Family Medicine

## 2018-08-16 DIAGNOSIS — M25561 Pain in right knee: Secondary | ICD-10-CM | POA: Insufficient documentation

## 2018-08-16 DIAGNOSIS — M7989 Other specified soft tissue disorders: Secondary | ICD-10-CM | POA: Diagnosis not present

## 2018-08-16 DIAGNOSIS — M25461 Effusion, right knee: Secondary | ICD-10-CM

## 2018-08-16 DIAGNOSIS — M7121 Synovial cyst of popliteal space [Baker], right knee: Secondary | ICD-10-CM | POA: Insufficient documentation

## 2018-08-19 ENCOUNTER — Other Ambulatory Visit: Payer: Self-pay

## 2018-08-19 ENCOUNTER — Other Ambulatory Visit
Admission: RE | Admit: 2018-08-19 | Discharge: 2018-08-19 | Disposition: A | Discharge status: Home / Self Care | Payer: 59 | Source: Ambulatory Visit | Attending: Family Medicine | Admitting: Family Medicine

## 2018-08-19 ENCOUNTER — Other Ambulatory Visit: Payer: Self-pay | Admitting: Family Medicine

## 2018-08-19 ENCOUNTER — Encounter: Payer: Self-pay | Admitting: Emergency Medicine

## 2018-08-19 ENCOUNTER — Emergency Department: Payer: 59

## 2018-08-19 ENCOUNTER — Emergency Department
Admission: EM | Admit: 2018-08-19 | Discharge: 2018-08-20 | Disposition: A | Discharge status: Home / Self Care | Payer: 59 | Attending: Emergency Medicine | Admitting: Emergency Medicine

## 2018-08-19 DIAGNOSIS — E119 Type 2 diabetes mellitus without complications: Secondary | ICD-10-CM | POA: Insufficient documentation

## 2018-08-19 DIAGNOSIS — Z7984 Long term (current) use of oral hypoglycemic drugs: Secondary | ICD-10-CM | POA: Insufficient documentation

## 2018-08-19 DIAGNOSIS — M25561 Pain in right knee: Secondary | ICD-10-CM | POA: Diagnosis not present

## 2018-08-19 DIAGNOSIS — I10 Essential (primary) hypertension: Secondary | ICD-10-CM | POA: Diagnosis not present

## 2018-08-19 DIAGNOSIS — Z7982 Long term (current) use of aspirin: Secondary | ICD-10-CM | POA: Diagnosis not present

## 2018-08-19 DIAGNOSIS — M7121 Synovial cyst of popliteal space [Baker], right knee: Secondary | ICD-10-CM | POA: Diagnosis not present

## 2018-08-19 DIAGNOSIS — Z79899 Other long term (current) drug therapy: Secondary | ICD-10-CM | POA: Diagnosis not present

## 2018-08-19 DIAGNOSIS — R799 Abnormal finding of blood chemistry, unspecified: Secondary | ICD-10-CM | POA: Diagnosis not present

## 2018-08-19 DIAGNOSIS — M79604 Pain in right leg: Secondary | ICD-10-CM | POA: Diagnosis present

## 2018-08-19 LAB — FIBRIN DERIVATIVES D-DIMER (ARMC ONLY): Fibrin derivatives D-dimer (ARMC): 611.9 ng/mL (FEU) — ABNORMAL HIGH (ref 0.00–499.00)

## 2018-08-19 NOTE — ED Triage Notes (Addendum)
Pt presents to ED with elevated D-dimer from pcp office earlier this evening. Pt states ultrasound was done Friday and was reported to be negative. Was called at home tonight and encouraged to come to ED for further evaluation. Pt has a "puffy spot on back of right leg" that feels "irritating and painful with walking". Has been ongoing for several weeks.

## 2018-08-19 NOTE — ED Notes (Signed)
Patient ambulatory to triage with steady gait, without difficulty or distress noted; pt sent over for neg u/s and elevated D-dimer for further evaluation

## 2018-08-19 NOTE — ED Provider Notes (Signed)
[facility] Emergency Department Provider Note  ____________________________________________   First MD Initiated Contact with Patient 08/19/18 2117     (approximate)  I have reviewed the triage vital signs and the nursing notes.   HISTORY  Chief Complaint Abnormal Lab   HPI Elizabeth Scott is a 58 y.o. female who presents to the emergency department for treatment and evaluation of pain behind the right knee for the last 3 weeks. Her primary care provider ordered an [place] last Friday that was "normal." PCP then ordered labs this evening and called to tell her to come to the ER immediately for further evaluation. Patient states pain has remained unchanged in location and quality since onset.    Past Medical History:  Diagnosis Date  . Allergy   . Chronic sinusitis   . Diabetes mellitus without complication (HCC)   . GERD (gastroesophageal reflux disease)   . High cholesterol   . History of abnormal cervical Pap smear    Prior to Hysterectomy, normal paps for 2-3 years after Hysterectomy  . History of gestational diabetes   . Hypertension   . Hypopotassemia   . Microcytosis     Patient Active Problem List   Diagnosis Date Noted  . Anemia, iron deficiency 10/17/2017  . Breast cancer screening 09/08/2016  . Left shoulder pain 09/08/2016  . Encounter for medication monitoring 03/31/2016  . 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 Walnut allergy 08/30/2015  . Preventative health care 08/30/2015  . DM type 2 (diabetes mellitus, type 2) (HCC) 04/27/2015  . Hypercholesterolemia 04/27/2015  . Essential hypertension, benign 04/27/2015  . Microcytosis 04/27/2015  . Morbid obesity (HCC) 04/27/2015  . Gastroesophageal reflux disease without esophagitis 01/08/2015    Past Surgical History:  Procedure Laterality Date  . ABDOMINAL HYSTERECTOMY  2003   still has ovaries-for menorrhagia  . CESAREAN SECTION    . COLONOSCOPY  02/22/14   repeat 5 years    . ESOPHAGOGASTRODUODENOSCOPY  02/22/14  . TUBAL LIGATION      Prior to Admission medications   Medication Sig Start Date End Date Taking? Authorizing Provider  amLODipine (NORVASC) 5 MG tablet Take 1 tablet (5 mg total) by mouth daily. 12/03/17  Yes Lada, Janit Bern, MD  aspirin 81 MG tablet Take 81 mg by mouth daily.   Yes [provider]  atorvastatin (LIPITOR) 40 MG tablet Take 2 tablets (80 mg total) by mouth at bedtime. 06/25/18  Yes Lada, Janit Bern, MD  cholecalciferol (VITAMIN D3) 25 MCG (1000 UT) tablet Take 1,000 Units by mouth daily.   Yes [provider]  cyclobenzaprine (FLEXERIL) 5 MG tablet Take 1 tablet (5 mg total) by mouth every 8 (eight) hours as needed for muscle spasms. Do not drive for 8 hours after taking 06/25/18  Yes Lada, Janit Bern, MD  EPINEPHrine (EPIPEN 2-PAK) 0.3 mg/0.3 mL IJ SOAJ injection Inject 0.3 mLs (0.3 mg total) into the muscle once. For life-threatening emergency only 08/30/15  Yes Lada, Janit Bern, MD  ezetimibe (ZETIA) 10 MG tablet Take 1 tablet (10 mg total) by mouth daily. 06/26/18  Yes Lada, Janit Bern, MD  famotidine (PEPCID) 20 MG tablet Take 1 tablet (20 mg total) by mouth daily. For heartburn 06/25/18  Yes Lada, Janit Bern, MD  glipiZIDE (GLUCOTROL XL) 10 MG 24 hr tablet Take 1 tablet (10 mg total) by mouth 2 (two) times daily. 06/25/18  Yes Lada, Janit Bern, MD  losartan (COZAAR) 50 MG tablet Take 1 tablet (50 mg  total) by mouth daily. For blood pressure and to protect kidneys; replaces lisinopril 06/25/18  Yes Lada, Janit BernMelinda P, MD  pioglitazone (ACTOS) 15 MG tablet Take 1 tablet (15 mg total) by mouth daily. 06/26/18  Yes Lada, Janit BernMelinda P, MD  TRADJENTA 5 MG TABS tablet TAKE 1 TABLET(5 MG) BY MOUTH DAILY 04/30/18  Yes Lada, Janit BernMelinda P, MD  albuterol (PROVENTIL HFA;VENTOLIN HFA) 108 (90 BASE) MCG/ACT inhaler Inhale 2 puffs into the lungs every 2 (two) hours as needed for wheezing or shortness of breath.    [provider]  fluticasone  (FLONASE) 50 MCG/ACT nasal spray Place 2 sprays into both nostrils daily. Patient not taking: Reported on 08/13/2018 06/25/18   Kerman PasseyLada, Melinda P, MD  ketotifen (ZADITOR) 0.025 % ophthalmic solution Place 1 drop into the left eye 2 (two) times daily. Patient not taking: Reported on 06/25/2018 12/08/17   Lutricia Feiloemer, William P, [name]  moxifloxacin (VIGAMOX) 0.5 % ophthalmic solution Place 1 drop into the left eye 3 (three) times daily. Patient not taking: Reported on 06/25/2018 12/08/17   Lutricia Feiloemer, William P, [name]    Allergies Metformin and related and Other  Family History  Problem Relation Age of Onset  . Diabetes Mother   . Heart disease Mother   . Hypertension Mother   . Stroke Mother   . Diabetes Father   . Hypertension Father   . Asthma Sister   . Diabetes Sister   . Hypertension Sister   . Lung disease Sister   . Hypertension Brother   . Heart disease Brother   . Stroke Brother   . Asthma Sister   . Diabetes Sister   . Hypertension Sister   . Congestive Heart Failure Maternal Grandmother   . Cancer Paternal Grandfather        stomach cancer  . COPD Neg Hx     Social History Social History   Tobacco Use  . Smoking status: Never Smoker  . Smokeless tobacco: Never Used  Substance Use Topics  . Alcohol use: No  . Drug use: No    Review of Systems  Constitutional: No fever/chills Eyes: No visual changes. ENT: No sore throat. Cardiovascular: Denies chest pain. Respiratory: Denies shortness of breath. Gastrointestinal: No abdominal pain.  No nausea, no vomiting.  No diarrhea.  No constipation. Genitourinary: Negative for dysuria. Musculoskeletal: Positive for left lower extremity pain. Skin: Negative for rash. Neurological: Negative for headaches, focal weakness or numbness. ____________________________________________   PHYSICAL EXAM:  VITAL SIGNS: ED Triage Vitals  Enc Vitals Group     BP 08/19/18 2111 (!) 169/76     Pulse Rate 08/19/18 2111 84     Resp  08/19/18 2111 20     Temp 08/19/18 2111 98.3 F (36.8 C)     Temp Source 08/19/18 2111 Oral     SpO2 08/19/18 2111 98 %     Weight 08/19/18 2113 218 lb (98.9 kg)     Height 08/19/18 2113 5\' 3"  (1.6 m)     Head Circumference --      Peak Flow --      Pain Score 08/19/18 2113 1     Pain Loc --      Pain Edu? --      Excl. in GC? --     Constitutional: Alert and oriented. Well appearing and in no acute distress. Eyes: Conjunctivae are normal. Head: Atraumatic. Nose: No congestion/rhinnorhea. Mouth/Throat: Mucous membranes are moist.  Oropharynx non-erythematous. Neck: No stridor.   Cardiovascular: Normal rate, regular  rhythm. Grossly normal heart sounds.  Good peripheral circulation. Respiratory: Normal respiratory effort.  No retractions. Lungs CTAB. Gastrointestinal: Soft and nontender. No distention. No abdominal bruits. No CVA tenderness. Musculoskeletal: No lower extremity tenderness nor edema.  No joint effusions. Neurologic:  Normal speech and language. No gross focal neurologic deficits are appreciated. No gait instability. Skin:  Skin is warm, dry and intact. No rash noted. Psychiatric: Mood and affect are normal. Speech and behavior are normal.  ____________________________________________   LABS (all labs ordered are listed, but only abnormal results are displayed)  Labs Reviewed - No data to display ____________________________________________  EKG  Not indicated. ____________________________________________  RADIOLOGY  ED MD interpretation: Baker's Cyst in the posterior right knee. Otherwise, negative US.  Official radiology report(s): [place] Venous Img Lower Unilateral Right  Result Date: 08/19/2018 CLINICAL DATA:  58 year old female with tenderness behind the right knee. EXAM: Right LOWER EXTREMITY VENOUS DOPPLER ULTRASOUND TECHNIQUE: Gray-scale sonography with graded compression, as well as color Doppler and duplex ultrasound were performed to evaluate the  lower extremity deep venous systems from the level of the common femoral vein and including the common femoral, femoral, profunda femoral, popliteal and calf veins including the posterior tibial, peroneal and gastrocnemius veins when visible. The superficial great saphenous vein was also interrogated. Spectral Doppler was utilized to evaluate flow at rest and with distal augmentation maneuvers in the common femoral, femoral and popliteal veins. COMPARISON:  None. FINDINGS: Contralateral Common Femoral Vein: Respiratory phasicity is normal and symmetric with the symptomatic side. No evidence of thrombus. Normal compressibility. Common Femoral Vein: No evidence of thrombus. Normal compressibility, respiratory phasicity and response to augmentation. Saphenofemoral Junction: No evidence of thrombus. Normal compressibility and flow on color Doppler imaging. Profunda Femoral Vein: No evidence of thrombus. Normal compressibility and flow on color Doppler imaging. Femoral Vein: No evidence of thrombus. Normal compressibility, respiratory phasicity and response to augmentation. Popliteal Vein: No evidence of thrombus. Normal compressibility, respiratory phasicity and response to augmentation. Calf Veins: No evidence of thrombus. Normal compressibility and flow on color Doppler imaging. Superficial Great Saphenous Vein: No evidence of thrombus. Normal compressibility. Venous Reflux:  None. Other Findings: There is a 2.4 x 0.4 x 1.1 cm cystic structure in the right popliteal fossa likely a Baker's cyst. IMPRESSION: 1. No evidence of deep venous thrombosis. 2. Probable Baker's cyst posterior to the right knee. Electronically Signed   By: Elgie CollardArash  Radparvar M.D.   On: 08/19/2018 23:17    ____________________________________________   PROCEDURES  Procedure(s) performed: None  Procedures  Critical Care performed: No  ____________________________________________   INITIAL IMPRESSION / ASSESSMENT AND PLAN / ED  COURSE  As part of my medical decision making, I reviewed the following data within the electronic MEDICAL RECORD NUMBERUltrasound results from 3 days ago   58 year old female presenting to the emergency department for treatment and evaluation of right lower extremity pain that is been present and ongoing for the past 3 weeks.  She initially had a soft tissue ultrasound on Friday which was negative.  D-dimer performed earlier today was positive and therefore her primary care provider sent her to the emergency department.  A venous Doppler ultrasound was performed while here which does show a Baker's cyst but no deep vein thrombosis.  She will follow-up with orthopedics.  She advised to take anti-inflammatories and also follow-up with her primary care provider if needed.  She is to return to the emergency department for any symptom that changes or worsen if she is unable to  schedule an appointment.       ____________________________________________   FINAL CLINICAL IMPRESSION(S) / ED DIAGNOSES  Final diagnoses:  Synovial cyst of right popliteal space     ED Discharge Orders    None       Note:  This document was prepared using Dragon voice recognition software and may include unintentional dictation errors.    Chinita Pester, [name] 08/20/18 0251    Nita Sickle, MD 08/20/18 [phone]

## 2018-08-19 NOTE — ED Provider Notes (Signed)
Medical screening examination/treatment/procedure(s) were conducted as a shared visit with non-physician practitioner(s) and myself.  I personally evaluated the patient during the encounter.   Please refer to NP's note for further details.  In brief summary this is a 58 year old female who was sent to the emergency room by her primary care doctor for pain behind her right knee for 3 weeks and a positive d-dimer.  Patient underwent a soft tissue ultrasound of her leg which was negative for a Baker's cyst.  Her PCP then sent a d-dimer which was positive at 611 and therefore recommended that she came to the emergency room for Doppler ultrasound.  On exam patient is well-appearing with no asymmetric swelling of bilateral lower extremities, no erythema, no tenderness.  She has strong distal pulses.  She has no chest pain or shortness of breath, no tachycardia or oxygen requirement.  Doppler studies were done here which were negative for DVT and positive for possible Baker's cyst.  Patient was discharged home on supportive care and follow-up with her primary care doctor.   Nita Sickle, MD 08/19/18 [phone]

## 2018-08-19 NOTE — Progress Notes (Signed)
I spoke with patient; she continues to have pain behind the right knee; [place] was negative for Baker's cyst Her appt with Ortho is Thursday Symptoms for 3 weeks sending her now for stat D-dimer to r/o DVT, though less likely; no plane flights, no long car trips, no hx of DVT Note in the order that if negative, patient can go home, if positive, contact on-call doctor, pt to go to ER

## 2018-08-20 NOTE — ED Notes (Signed)
Pt sent to ED due to elevated d-dimer levels. Pt currently denying any leg pain, chest pain, or SOB at this time. Pt appears to be in NAD at this time. This RN

## 2018-08-22 DIAGNOSIS — M7121 Synovial cyst of popliteal space [Baker], right knee: Secondary | ICD-10-CM | POA: Diagnosis not present

## 2018-08-22 DIAGNOSIS — M25561 Pain in right knee: Secondary | ICD-10-CM | POA: Diagnosis not present

## 2018-08-22 DIAGNOSIS — M1711 Unilateral primary osteoarthritis, right knee: Secondary | ICD-10-CM | POA: Diagnosis not present

## 2018-08-22 DIAGNOSIS — M11261 Other chondrocalcinosis, right knee: Secondary | ICD-10-CM | POA: Diagnosis not present

## 2018-09-02 DIAGNOSIS — M7918 Myalgia, other site: Secondary | ICD-10-CM | POA: Diagnosis not present

## 2018-09-02 DIAGNOSIS — M25511 Pain in right shoulder: Secondary | ICD-10-CM | POA: Diagnosis not present

## 2018-09-02 DIAGNOSIS — M62838 Other muscle spasm: Secondary | ICD-10-CM | POA: Diagnosis not present

## 2018-09-25 ENCOUNTER — Encounter: Payer: Self-pay | Admitting: Family Medicine

## 2018-09-25 ENCOUNTER — Ambulatory Visit (INDEPENDENT_AMBULATORY_CARE_PROVIDER_SITE_OTHER): Payer: 59 | Admitting: Family Medicine

## 2018-09-25 VITALS — BP 134/82 | HR 78 | Temp 97.8°F | Resp 12 | Ht 63.0 in | Wt 220.1 lb

## 2018-09-25 DIAGNOSIS — E1165 Type 2 diabetes mellitus with hyperglycemia: Secondary | ICD-10-CM | POA: Diagnosis not present

## 2018-09-25 DIAGNOSIS — D582 Other hemoglobinopathies: Secondary | ICD-10-CM | POA: Diagnosis not present

## 2018-09-25 DIAGNOSIS — I10 Essential (primary) hypertension: Secondary | ICD-10-CM

## 2018-09-25 DIAGNOSIS — E7800 Pure hypercholesterolemia, unspecified: Secondary | ICD-10-CM

## 2018-09-25 DIAGNOSIS — E119 Type 2 diabetes mellitus without complications: Secondary | ICD-10-CM

## 2018-09-25 DIAGNOSIS — K219 Gastro-esophageal reflux disease without esophagitis: Secondary | ICD-10-CM

## 2018-09-25 MED ORDER — RANITIDINE HCL 150 MG PO TABS: 150.0000 mg | ORAL_TABLET | Freq: Two times a day (BID) | ORAL | 5 refills | Status: DC

## 2018-09-25 NOTE — Assessment & Plan Note (Addendum)
stresesd th eimportance of weigh tloss; BMI > 35 with diabetes, high cholesterol, GERD, HTN; encouragement given, support offered

## 2018-09-25 NOTE — Assessment & Plan Note (Signed)
Foot exam by MD today; encouraged weigh tloss; check A1c

## 2018-09-25 NOTE — Assessment & Plan Note (Signed)
Goal BP less than 130/80; encouraged weight loss, DASH guidelines

## 2018-09-25 NOTE — Assessment & Plan Note (Signed)
Chronic, stable 

## 2018-09-25 NOTE — Assessment & Plan Note (Signed)
Switch H2 blockers; avoid eating 3 hours before bed, limit or avoid triggers

## 2018-09-25 NOTE — Assessment & Plan Note (Signed)
Encouraged helathy eating, weigh tloss; diet low in saturated fats

## 2018-09-25 NOTE — Patient Instructions (Addendum)
Check out the information at familydoctor.org entitled "Nutrition for Weight Loss: What You Need to Know about Fad Diets" Try to lose between 1-2 pounds per week by taking in fewer calories and burning off more calories You can succeed by limiting portions, limiting foods dense in calories and fat, becoming more active, and drinking 8 glasses of water a day (64 ounces) Don't skip meals, especially breakfast, as skipping meals may alter your metabolism Do not use over-the-counter weight loss pills or gimmicks that claim rapid weight loss A healthy BMI (or body mass index) is between 18.5 and 24.9 You can calculate your ideal BMI at the NIH website JobEconomics.hu  Try to limit saturated fats in your diet (bologna, hot dogs, barbeque, cheeseburgers, hamburgers, steak, bacon, sausage, cheese, etc.) and get more fresh fruits, vegetables, and whole grains  Try to use PLAIN allergy medicine without the decongestant Avoid: phenylephrine, phenylpropanolamine, and pseudoephredine  If you need something for aches or pains, try to use Tylenol (acetaminophen) instead of non-steroidals (which include Aleve, ibuprofen, Advil, Motrin, and naproxen); non-steroidals can cause long-term kidney damage and can increase your blood pressure  Try to limit your calories to 1700 calories a day to lose one pound a week   Obesity, Adult Obesity is the condition of having too much total body fat. Being overweight or obese means that your weight is greater than what is considered healthy for your body size. Obesity is determined by a measurement called BMI. BMI is an estimate of body fat and is calculated from height and weight. For adults, a BMI of 30 or higher is considered obese. Obesity can eventually lead to other health concerns and major illnesses, including:  Stroke.  Coronary artery disease (CAD).  Type 2 diabetes.  Some types of cancer, including cancers  of the colon, breast, uterus, and gallbladder.  Osteoarthritis.  High blood pressure (hypertension).  High cholesterol.  Sleep apnea.  Gallbladder stones.  Infertility problems. What are the causes? The main cause of obesity is taking in (consuming) more calories than your body uses for energy. Other factors that contribute to this condition may include:  Being born with genes that make you more likely to become obese.  Having a medical condition that causes obesity. These conditions include: ? Hypothyroidism. ? Polycystic ovarian syndrome (PCOS). ? Binge-eating disorder. ? Cushing syndrome.  Taking certain medicines, such as steroids, antidepressants, and seizure medicines.  Not being physically active (sedentary lifestyle).  Living where there are limited places to exercise safely or buy healthy foods.  Not getting enough sleep. What increases the risk? The following factors may increase your risk of this condition:  Having a family history of obesity.  Being a woman of African-American descent.  Being a man of Hispanic descent. What are the signs or symptoms? Having excessive body fat is the main symptom of this condition. How is this diagnosed? This condition may be diagnosed based on:  Your symptoms.  Your medical history.  A physical exam. Your health care provider may measure: ? Your BMI. If you are an adult with a BMI between 25 and less than 30, you are considered overweight. If you are an adult with a BMI of 30 or higher, you are considered obese. ? The distances around your hips and your waist (circumferences). These may be compared to each other to help diagnose your condition. ? Your skinfold thickness. Your health care provider may gently pinch a fold of your skin and measure it. How is this treated?  Treatment for this condition often includes changing your lifestyle. Treatment may include some or all of the following:  Dietary changes. Work with  your health care provider and a dietitian to set a weight-loss goal that is healthy and reasonable for you. Dietary changes may include eating: ? Smaller portions. A portion size is the amount of a particular food that is healthy for you to eat at one time. This varies from person to person. ? Low-calorie or low-fat options. ? More whole grains, fruits, and vegetables.  Regular physical activity. This may include aerobic activity (cardio) and strength training.  Medicine to help you lose weight. Your health care provider may prescribe medicine if you are unable to lose 1 pound a week after 6 weeks of eating more healthily and doing more physical activity.  Surgery. Surgical options may include gastric banding and gastric bypass. Surgery may be done if: ? Other treatments have not helped to improve your condition. ? You have a BMI of 40 or higher. ? You have life-threatening health problems related to obesity. Follow these instructions at home:  Eating and drinking   Follow recommendations from your health care provider about what you eat and drink. Your health care provider may advise you to: ? Limit fast foods, sweets, and processed snack foods. ? Choose low-fat options, such as low-fat milk instead of whole milk. ? Eat 5 or more servings of fruits or vegetables every day. ? Eat at home more often. This gives you more control over what you eat. ? Choose healthy foods when you eat out. ? Learn what a healthy portion size is. ? Keep low-fat snacks on hand. ? Avoid sugary drinks, such as soda, fruit juice, iced tea sweetened with sugar, and flavored milk. ? Eat a healthy breakfast.  Drink enough water to keep your urine clear or pale yellow.  Do not go without eating for long periods of time (do not fast) or follow a fad diet. Fasting and fad diets can be unhealthy and even dangerous. Physical Activity  Exercise regularly, as told by your health care provider. Ask your health care  provider what types of exercise are safe for you and how often you should exercise.  Warm up and stretch before being active.  Cool down and stretch after being active.  Rest between periods of activity. Lifestyle  Limit the time that you spend in front of your TV, computer, or video game system.  Find ways to reward yourself that do not involve food.  Limit alcohol intake to no more than 1 drink a day for nonpregnant women and 2 drinks a day for men. One drink equals 12 oz of beer, 5 oz of wine, or 1 oz of hard liquor. General instructions  Keep a weight loss journal to keep track of the food you eat and how much you exercise you get.  Take over-the-counter and prescription medicines only as told by your health care provider.  Take vitamins and supplements only as told by your health care provider.  Consider joining a support group. Your health care provider may be able to recommend a support group.  Keep all follow-up visits as told by your health care provider. This is important. Contact a health care provider if:  You are unable to meet your weight loss goal after 6 weeks of dietary and lifestyle changes. This information is not intended to replace advice given to you by your health care provider. Make sure you discuss any questions you have  with your health care provider. Document Released: 08/24/2004 Document Revised: 12/20/2015 Document Reviewed: 05/05/2015 Elsevier Interactive Patient Education  2019 Elsevier Inc.  Preventing Unhealthy Kinder Morgan EnergyWeight Gain, Adult Staying at a healthy weight is important to your overall health. When fat builds up in your body, you may become overweight or obese. Being overweight or obese increases your risk of developing certain health problems, such as heart disease, diabetes, sleeping problems, joint problems, and some types of cancer. Unhealthy weight gain is often the result of making unhealthy food choices or not getting enough exercise. You  can make changes to your lifestyle to prevent obesity and stay as healthy as possible. What nutrition changes can be made?   Eat only as much as your body needs. To do this: ? Pay attention to signs that you are hungry or full. Stop eating as soon as you feel full. ? If you feel hungry, try drinking water first before eating. Drink enough water so your urine is clear or pale yellow. ? Eat smaller portions. Pay attention to portion sizes when eating out. ? Look at serving sizes on food labels. Most foods contain more than one serving per container. ? Eat the recommended number of calories for your gender and activity level. For most active people, a daily total of 2,000 calories is appropriate. If you are trying to lose weight or are not very active, you may need to eat fewer calories. Talk with your health care provider or a diet and nutrition specialist (dietitian) about how many calories you need each day.  Choose healthy foods, such as: ? Fruits and vegetables. At each meal, try to fill at least half of your plate with fruits and vegetables. ? Whole grains, such as whole-wheat bread, brown rice, and quinoa. ? Lean meats, such as chicken or fish. ? Other healthy proteins, such as beans, eggs, or tofu. ? Healthy fats, such as nuts, seeds, fatty fish, and olive oil. ? Low-fat or fat-free dairy products.  Check food labels, and avoid food and drinks that: ? Are high in calories. ? Have added sugar. ? Are high in sodium. ? Have saturated fats or trans fats.  Cook foods in healthier ways, such as by baking, broiling, or grilling.  Make a meal plan for the week, and shop with a grocery list to help you stay on track with your purchases. Try to avoid going to the grocery store when you are hungry.  When grocery shopping, try to shop around the outside of the store first, where the fresh foods are. Doing this helps you to avoid prepackaged foods, which can be high in sugar, salt (sodium), and  fat. What lifestyle changes can be made?   Exercise for 30 or more minutes on 5 or more days each week. Exercising may include brisk walking, yard work, biking, running, swimming, and team sports like basketball and soccer. Ask your health care provider which exercises are safe for you.  Do muscle-strengthening activities, such as lifting weights or using resistance bands, on 2 or more days a week.  Do not use any products that contain nicotine or tobacco, such as cigarettes and e-cigarettes. If you need help quitting, ask your health care provider.  Limit alcohol intake to no more than 1 drink a day for nonpregnant women and 2 drinks a day for men. One drink equals 12 oz of beer, 5 oz of wine, or 1 oz of hard liquor.  Try to get 7-9 hours of sleep each night. What  other changes can be made?  Keep a food and activity journal to keep track of: ? What you ate and how many calories you had. Remember to count the calories in sauces, dressings, and side dishes. ? Whether you were active, and what exercises you did. ? Your calorie, weight, and activity goals.  Check your weight regularly. Track any changes. If you notice you have gained weight, make changes to your diet or activity routine.  Avoid taking weight-loss medicines or supplements. Talk to your health care provider before starting any new medicine or supplement.  Talk to your health care provider before trying any new diet or exercise plan. Why are these changes important? Eating healthy, staying active, and having healthy habits can help you to prevent obesity. Those changes also:  Help you manage stress and emotions.  Help you connect with friends and family.  Improve your self-esteem.  Improve your sleep.  Prevent long-term health problems. What can happen if changes are not made? Being obese or overweight can cause you to develop joint or bone problems, which can make it hard for you to stay active or do activities you  enjoy. Being obese or overweight also puts stress on your heart and lungs and can lead to health problems like diabetes, heart disease, and some cancers. Where to find more information Talk with your health care provider or a dietitian about healthy eating and healthy lifestyle choices. You may also find information from:  U.S. Department of Agriculture, MyPlate: https://ball-collins.biz/  American Heart Association: www.heart.org  Centers for Disease Control and Prevention: FootballExhibition.com.br Summary  Staying at a healthy weight is important to your overall health. It helps you to prevent certain diseases and health problems, such as heart disease, diabetes, joint problems, sleep disorders, and some types of cancer.  Being obese or overweight can cause you to develop joint or bone problems, which can make it hard for you to stay active or do activities you enjoy.  You can prevent unhealthy weight gain by eating a healthy diet, exercising regularly, not smoking, limiting alcohol, and getting enough sleep.  Talk with your health care provider or a dietitian for guidance about healthy eating and healthy lifestyle choices. This information is not intended to replace advice given to you by your health care provider. Make sure you discuss any questions you have with your health care provider. Document Released: 07/18/2016 Document Revised: 04/27/2017 Document Reviewed: 08/23/2016 Elsevier Interactive Patient Education  2019 [employer].

## 2018-09-25 NOTE — Progress Notes (Signed)
BP 134/82   Pulse 78   Temp 97.8 F (36.6 C) (Oral)   Resp 12   Ht  (1.6 m)   Wt 220 lb 1.6 oz (99.8 kg)   SpO2 98%   BMI 38.99 kg/m    Subjective:    Patient ID: Elizabeth Scott, female    DOB: 1961/04/07, 58 y.o.   MRN: [id number]  HPI: Elizabeth Scott is a 58 y.o. female  Chief Complaint  Patient presents with  . Follow-up    HPI Patient is here for follow-up  Type 2 diabetes; not checking FSBS with my blessing; she is overdue for eye exam; no blurred vision than usual; no frequent urination; no really dry mouth; trying to limit sugary drinks "but I can't"; she has tried diet [place]  Lab Results  Component Value Date   HGBA1C 7.6 (H) 06/25/2018   Morbidly obesity BMI >35 plus type 2 diabetes, GERD, high cholesterol She eats late and works weird hours; works in a kitchen all day; if she has to [employer] something, eats late [place] helped her weight but yeast infections Walking at work; she knows to needs to really lose weight; does not drink enough water  High cholesterol; she does eat bacon and sausage and all those things; eats some cheese but not much, borderline lactose intolerant, limits  Hypertension; has had HTN for a long time; on medicine, at least 12 years; no decongestants usually; taking ibuprofen for her bad knee; not much salty foods  GERD; on another medicine and not as strong; spaghetti is not a trigger; nothing in particular; no blood in stool; no abd pain; has the reflux and tums make it goes away  She has hgb C trait, chronic  Depression screen [facility] 2/9 09/25/2018 08/13/2018 06/25/2018 11/23/2017 10/17/2017  Decreased Interest 0 0 0 0 0  Down, Depressed, Hopeless 0 0 0 0 0  PHQ - 2 Score 0 0 0 0 0  Altered sleeping 0 0 0 - -  Tired, decreased energy 0 0 0 - -  Change in appetite 0 0 0 - -  Feeling bad or failure about yourself  0 0 0 - -  Trouble concentrating 0 0 0 - -  Moving slowly or fidgety/restless 0 0 0 - -  Suicidal  thoughts 0 0 0 - -  PHQ-9 Score 0 0 0 - -  Difficult doing work/chores Not difficult at all Not difficult at all Not difficult at all - -   Fall Risk  09/25/2018 08/13/2018 06/25/2018 11/23/2017 10/17/2017  Falls in the past year? 0 0 0 No No  Number falls in past yr: 0 0 0 - -  Injury with Fall? 0 0 - - -  Follow up - - - - -    Relevant past medical, surgical, family and social history reviewed Past Medical History:  Diagnosis Date  . Allergy   . Chronic sinusitis   . Diabetes mellitus without complication (HCC)   . GERD (gastroesophageal reflux disease)   . High cholesterol   . History of abnormal cervical Pap smear    Prior to Hysterectomy, normal paps for 2-3 years after Hysterectomy  . History of gestational diabetes   . Hypertension   . Hypopotassemia   . Microcytosis    Past Surgical History:  Procedure Laterality Date  . ABDOMINAL HYSTERECTOMY  2003   still has ovaries-for menorrhagia  . CESAREAN SECTION    . COLONOSCOPY  02/22/14   repeat  5 years  . ESOPHAGOGASTRODUODENOSCOPY  02/22/14  . TUBAL LIGATION     Family History  Problem Relation Age of Onset  . Diabetes Mother   . Heart disease Mother   . Hypertension Mother   . Stroke Mother   . Diabetes Father   . Hypertension Father   . Asthma Sister   . Diabetes Sister   . Hypertension Sister   . Lung disease Sister   . Hypertension Brother   . Heart disease Brother   . Stroke Brother   . Asthma Sister   . Diabetes Sister   . Hypertension Sister   . Congestive Heart Failure Maternal Grandmother   . Cancer Paternal Grandfather        stomach cancer  . COPD Neg Hx    Social History   Tobacco Use  . Smoking status: Never Smoker  . Smokeless tobacco: Never Used  Substance Use Topics  . Alcohol use: No  . Drug use: No     [facility] Visit from 09/25/2018 in [facility]  AUDIT-C Score  0     Interim medical history since last visit reviewed. Allergies and medications  reviewed  Review of Systems Per HPI unless specifically indicated above     Objective:    BP 134/82   Pulse 78   Temp 97.8 F (36.6 C) (Oral)   Resp 12   Ht 5\' 3"  (1.6 m)   Wt 220 lb 1.6 oz (99.8 kg)   SpO2 98%   BMI 38.99 kg/m   Wt Readings from Last 3 Encounters:  09/25/18 220 lb 1.6 oz (99.8 kg)  08/19/18 218 lb (98.9 kg)  08/13/18 218 lb 3.2 oz (99 kg)    Physical Exam Constitutional:      General: She is not in acute distress.    Appearance: She is well-developed. She is not diaphoretic.  HENT:     Head: Normocephalic and atraumatic.  Eyes:     General: No scleral icterus. Neck:     Thyroid: No thyromegaly.  Cardiovascular:     Rate and Rhythm: Normal rate and regular rhythm.     Heart sounds: Normal heart sounds. No murmur.  Pulmonary:     Effort: Pulmonary effort is normal. No respiratory distress.     Breath sounds: Normal breath sounds. No wheezing.  Abdominal:     General: Bowel sounds are normal. There is no distension.     Palpations: Abdomen is soft.  Skin:    General: Skin is warm and dry.     Coloration: Skin is not pale.  Neurological:     Mental Status: She is alert.  Psychiatric:        Behavior: Behavior normal.        Thought Content: Thought content normal.        Judgment: Judgment normal.    Diabetic Foot Form - Detailed   Diabetic Foot Exam - detailed Diabetic Foot exam was performed with the following findings:  Yes 09/25/2018  6:11 PM  Visual Foot Exam completed.:  Yes  Pulse Foot Exam completed.:  Yes  Right Dorsalis Pedis:  Present Left Dorsalis Pedis:  Present  Sensory Foot Exam Completed.:  Yes Semmes-Weinstein Monofilament Test R Site 1-Great Toe:  Pos L Site 1-Great Toe:  Pos           Assessment & Plan:   Problem List Items Addressed This Visit      Cardiovascular and Mediastinum   Essential hypertension, benign (Chronic)  Goal BP less than 130/80; encouraged weight loss, DASH guidelines        Digestive    Gastroesophageal reflux disease without esophagitis    Switch H2 blockers; avoid eating 3 hours before bed, limit or avoid triggers      Relevant Medications   ranitidine (ZANTAC) 150 MG tablet     Endocrine   Uncontrolled type 2 diabetes mellitus with hyperglycemia, without long-term current use of insulin (HCC) (Chronic)    Foot exam by MD today; encouraged weigh tloss; check A1c      RESOLVED: DM type 2 (diabetes mellitus, type 2) (HCC) - Primary   Relevant Orders   Hemoglobin A1C     Other   Morbid obesity (HCC)    stresesd th eimportance of weigh tloss; BMI > 35 with diabetes, high cholesterol, GERD, HTN; encouragement given, support offered      Hypercholesterolemia    Encouraged helathy eating, weigh tloss; diet low in saturated fats      Relevant Orders   Lipid panel (Completed)   Hemoglobin C trait (HCC)    Chronic, stable          Follow up plan: Return in about 3 months (around 12/24/2018) for follow-up visit with Dr. Sherie Don or just after.  An after-visit summary was printed and given to the patient at check-out.  Please see the patient instructions which may contain other information and recommendations beyond what is mentioned above in the assessment and plan.  Meds ordered this encounter  Medications  . ranitidine (ZANTAC) 150 MG tablet    Sig: Take 1 tablet (150 mg total) by mouth 2 (two) times daily. If needed for acid reflux    Dispense:  60 tablet    Refill:  5    Orders Placed This Encounter  Procedures  . Hemoglobin A1C  . Lipid panel

## 2018-09-26 LAB — LIPID PANEL
Cholesterol: 118 mg/dL (ref <200)
HDL: 45 mg/dL — ABNORMAL LOW (ref >=50)
LDL Cholesterol (Calc): 59 mg/dL (calc)
Non-HDL Cholesterol (Calc): 73 mg/dL (calc) (ref <130)
Total CHOL/HDL Ratio: 2.6 (calc) (ref <5.0)
Triglycerides: 62 mg/dL (ref <150)

## 2018-09-26 LAB — HEMOGLOBIN A1C
Hgb A1c MFr Bld: 6.4 % of total Hgb — ABNORMAL HIGH (ref <5.7)
Mean Plasma Glucose: 137 (calc)
eAG (mmol/L): 7.6 (calc)

## 2018-10-10 ENCOUNTER — Telehealth: Payer: Self-pay | Admitting: Family Medicine

## 2018-10-14 ENCOUNTER — Telehealth: Payer: Self-pay | Admitting: Family Medicine

## 2018-10-14 NOTE — Telephone Encounter (Signed)
Copied from CRM [id number]. Topic: Quick Communication - Rx Refill/Question >> Oct 14, 2018  2:24 PM Wyonia Hough E wrote: Medication: TRADJENTA 5 MG TABS tablet   Has the patient contacted their pharmacy? Yes - needs PA  Preferred Pharmacy (with phone number or street name): [id number] - [place],  - [address] AT [facility] [phone] (Phone) [phone] (Fax)    Agent: Please be advised that RX refills may take up to 3 business days. We ask that you follow-up with your pharmacy.

## 2018-10-14 NOTE — Telephone Encounter (Signed)
Pt states prior auth: will look into

## 2018-10-17 NOTE — Telephone Encounter (Signed)
Pt is calling back, on this medication, the refill is out there but the insurance is stating that they need approval from dr to cover. Pharmacy said that form has been sent to [facility] on 3/17

## 2018-10-18 NOTE — Telephone Encounter (Signed)
Filed through cover my meds

## 2018-10-20 ENCOUNTER — Other Ambulatory Visit: Payer: Self-pay | Admitting: Family Medicine

## 2018-10-20 NOTE — Telephone Encounter (Signed)
Approved 90 day supply for H2 blocker

## 2018-10-21 ENCOUNTER — Telehealth: Payer: 59 | Admitting: Physician Assistant

## 2018-10-21 ENCOUNTER — Encounter: Payer: 59 | Admitting: Family Medicine

## 2018-10-21 DIAGNOSIS — H1030 Unspecified acute conjunctivitis, unspecified eye: Secondary | ICD-10-CM | POA: Diagnosis not present

## 2018-10-21 MED ORDER — LINAGLIPTIN 5 MG PO TABS: 5.0000 mg | ORAL_TABLET | Freq: Every day | ORAL | 1 refills | Status: DC

## 2018-10-21 NOTE — Telephone Encounter (Signed)
Patient called to say that she is waiting to hear about the approval for her TRADJENTA 5 MG TABS tablet. Patient was informed that it was done just awaiting approval. She is also asking for an Rx to be sent to her Pharmacy for Pink eye Please advise

## 2018-10-21 NOTE — Progress Notes (Signed)
Still awaiting patient response so that treatment course and care plan can be given.

## 2018-10-21 NOTE — Telephone Encounter (Signed)
Pt.notified

## 2018-10-21 NOTE — Telephone Encounter (Addendum)
Recommend evist for pink eye. Reordered tradjenta.

## 2018-10-21 NOTE — Progress Notes (Signed)
Message sent to patient for further information regarding an allergic reaction to prior eye drops.   Awaiting response.

## 2018-10-21 NOTE — Addendum Note (Signed)
Addended by: Cheryle Horsfall on: 10/21/2018 04:34 PM   Modules accepted: Orders

## 2018-10-22 MED ORDER — LOTEPREDNOL ETABONATE 0.5 % OP SUSP: 2.0000 [drp] | Freq: Four times a day (QID) | OPHTHALMIC | 0 refills | Status: DC

## 2018-10-22 NOTE — Addendum Note (Signed)
Addended by: Worthy Rancher B on: 10/22/2018 08:04 AM   Modules accepted: Orders

## 2018-10-24 NOTE — Telephone Encounter (Signed)
Patient stated that her TRADJENTA 5 MG TABS tablet medciation has not been filled by the pharmacy because they have not received the Prior Authorization from [employer] and she has been out of medication for weeks now.

## 2018-10-25 NOTE — Telephone Encounter (Signed)
Looks like her ins. is denying tradjenta, and wanting her to take/try Januvia.  She has been out on meds since 3/12.  But does have allergy to metformin or anything that has metformin in it?

## 2018-10-28 ENCOUNTER — Other Ambulatory Visit: Payer: Self-pay | Admitting: Family Medicine

## 2018-10-28 MED ORDER — SITAGLIPTIN PHOSPHATE 100 MG PO TABS: 100.0000 mg | ORAL_TABLET | Freq: Every day | ORAL | 1 refills | Status: DC

## 2018-10-28 NOTE — Telephone Encounter (Signed)
I personally called the pharmacy Pharmacist says tradjenta was covered, picked up CANCEL the [place]

## 2018-10-28 NOTE — Telephone Encounter (Signed)
Actually patient states they covered her tradjenta?

## 2018-10-28 NOTE — Progress Notes (Signed)
Elizabeth Scott was covered; [place] cancelled (I spoke to pharmacist personally)

## 2018-10-28 NOTE — Telephone Encounter (Signed)
I sent [place] earlier today

## 2018-11-03 ENCOUNTER — Other Ambulatory Visit: Payer: Self-pay | Admitting: Family Medicine

## 2018-11-03 NOTE — Telephone Encounter (Signed)
Lab Results  Component Value Date   CHOL 118 09/25/2018   HDL 45 (L) 09/25/2018   LDLCALC 59 09/25/2018   TRIG 62 09/25/2018   CHOLHDL 2.6 09/25/2018   Lab Results  Component Value Date   ALT 15 06/25/2018

## 2018-11-20 ENCOUNTER — Encounter: Payer: 59 | Admitting: Family Medicine

## 2018-12-12 ENCOUNTER — Other Ambulatory Visit: Payer: Self-pay | Admitting: Family Medicine

## 2018-12-20 ENCOUNTER — Encounter: Payer: 59 | Admitting: Family Medicine

## 2018-12-24 ENCOUNTER — Ambulatory Visit (INDEPENDENT_AMBULATORY_CARE_PROVIDER_SITE_OTHER): Payer: 59 | Admitting: Nurse Practitioner

## 2018-12-24 ENCOUNTER — Other Ambulatory Visit: Payer: Self-pay

## 2018-12-24 ENCOUNTER — Encounter: Payer: Self-pay | Admitting: Nurse Practitioner

## 2018-12-24 VITALS — BP 122/74 | HR 90 | Temp 98.2°F | Resp 14 | Ht 63.25 in | Wt 223.4 lb

## 2018-12-24 DIAGNOSIS — Z79899 Other long term (current) drug therapy: Secondary | ICD-10-CM

## 2018-12-24 DIAGNOSIS — E119 Type 2 diabetes mellitus without complications: Secondary | ICD-10-CM

## 2018-12-24 DIAGNOSIS — Z1212 Encounter for screening for malignant neoplasm of rectum: Secondary | ICD-10-CM

## 2018-12-24 DIAGNOSIS — Z0000 Encounter for general adult medical examination without abnormal findings: Secondary | ICD-10-CM

## 2018-12-24 DIAGNOSIS — Z1239 Encounter for other screening for malignant neoplasm of breast: Secondary | ICD-10-CM

## 2018-12-24 DIAGNOSIS — Z1211 Encounter for screening for malignant neoplasm of colon: Secondary | ICD-10-CM

## 2018-12-24 NOTE — Patient Instructions (Addendum)
General recommendations: 150 minutes of physical activity weekly, eat two servings of fish weekly, eat one serving of tree nuts ( cashews, pistachios, pecans, almonds.Marland Kitchen) every other day, eat 6 servings of fruit/vegetables daily and drink plenty of water and avoid sweet beverages.  Please do call to schedule your mammogram; the number to schedule one at either [facility] or [facility] is [phone]  Sleep Hygiene Tips 1) Get regular. One of the best ways to train your body to sleep well is to go to bed and get up at more or less the same time every day, even on weekends and days off! This regular rhythm will make you feel better and will give your body something to work from. 2) Sleep when sleepy. Only try to sleep when you actually feel tired or sleepy, rather than spending too much time awake in bed. 3) Get up & try again. If you haven't been able to get to sleep after about 20 minutes or more, get up and do something calming or boring until you feel sleepy, then return to bed and try again. Sit quietly on the couch with the lights off (bright light will tell your brain that it is time to wake up), or read something boring like the phone book. Avoid doing anything that is too stimulating or interesting, as this will wake you up even more. 4) Avoid caffeine & nicotine. It is best to avoid consuming any caffeine (in coffee, tea, cola drinks, chocolate, and some medications) or nicotine (cigarettes) for at least 4-6 hours before going to bed. These substances act as stimulants and interfere with the ability to fall asleep 5) Avoid alcohol. It is also best to avoid alcohol for at least 4-6 hours before going to bed. Many people believe that alcohol is relaxing and helps them to get to sleep at first, but it actually interrupts the quality of sleep. 6) Bed is for sleeping. Try not to use your bed for anything other than sleeping and sex, so that your  body comes to associate bed with sleep. If you use bed as a place to watch TV, eat, read, work on your laptop, pay bills, and other things, your body will not learn this Connection. 7) No naps. It is best to avoid taking naps during the day, to make sure that you are tired at bedtime. If you can't make it through the day without a nap, make sure it is for less than an hour and before 3pm. 8) Sleep rituals. You can develop your own rituals of things to remind your body that it is time to sleep - some people find it useful to do relaxing stretches or breathing exercises for 15 minutes before bed each night, or sit calmly with a cup of caffeine-free tea. 9) Bathtime. Having a hot bath 1-2 hours before bedtime can be useful, as it will raise your body temperature, causing you to feel sleepy as your body temperature drops again. Research shows that sleepiness is associated with a drop in body temperature. 10) No clock-watching. Many people who struggle with sleep tend to watch the clock too much. Frequently checking the clock during the night can wake you up (especially if you turn on the light to read the time) and reinforces negative thoughts such as "Oh no, look how late it is, I'll never get to sleep" or "it's so early, I have only slept for 5 hours, this is terrible." 11) Use a sleep diary. This worksheet  can be a useful way of making sure you have the right facts about your sleep, rather than making assumptions. Because a diary involves watching the clock (see point 10) it is a good idea to only use it for two weeks to get an idea of what is going and then perhaps two months down the track to see how you are progressing. 12) Exercise. Regular exercise is a good idea to help with good sleep, but try not to do strenuous exercise in the 4 hours before bedtime. Morning walks are a great way to start the day feeling refreshed! 13) Eat right. A healthy, balanced diet will help you  to sleep well, but timing is important. Some people find that a very empty stomach at bedtime is distracting, so it can be useful to have a light snack, but a heavy meal soon before bed can also interrupt sleep. Some people recommend a warm glass of milk, which contains tryptophan, which acts as a natural sleep inducer. 14) The right space. It is very important that your bed and bedroom are quiet and comfortable for sleeping. A cooler room with enough blankets to stay warm is best, and make sure you have curtains or an eyemask to block out early morning light and earplugs if there is noise outside your room. 15) Keep daytime routine the same. Even if you have a bad night sleep and are tired it is important that you try to keep your daytime activities the same as you had planned. That is, don't avoid activities because you feel tired. This can reinforce the insomnia.

## 2018-12-24 NOTE — Progress Notes (Signed)
Name: Elizabeth Scott   MRN: [id number]    DOB: 02/02/1961   Date:12/24/2018       Progress Note  Subjective  Chief Complaint  Chief Complaint  Patient presents with  . Annual Exam    HPI  Patient presents for annual CPE.  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  Eats about 2-3 servings of vegetables daily  Eats about 2 servings of fruit daily Drinks mountain dew- 32 ounces daily. Tries to drink water but doesn't like it- states tries different flavorings but doesn't like it but is working on it.   Exercise:  She is on her feet all day at work but has no routine exercise outside of work due to chronic knee pain.   USPSTF grade A and B recommendations    Office Visit from 12/24/2018 in [facility]  AUDIT-C Score  0     Depression: Phq 9 is  negative Depression screen [facility] 2/9 12/24/2018 09/25/2018 08/13/2018 06/25/2018 11/23/2017  Decreased Interest 0 0 0 0 0  Down, Depressed, Hopeless 0 0 0 0 0  PHQ - 2 Score 0 0 0 0 0  Altered sleeping 0 0 0 0 -  Tired, decreased energy 0 0 0 0 -  Change in appetite 0 0 0 0 -  Feeling bad or failure about yourself  0 0 0 0 -  Trouble concentrating 0 0 0 0 -  Moving slowly or fidgety/restless 0 0 0 0 -  Suicidal thoughts 0 0 0 0 -  PHQ-9 Score 0 0 0 0 -  Difficult doing work/chores Not difficult at all Not difficult at all Not difficult at all Not difficult at all -   Hypertension: BP Readings from Last 3 Encounters:  12/24/18 122/74  09/25/18 134/82  08/19/18 (!) 160/70   Obesity: Wt Readings from Last 3 Encounters:  12/24/18 223 lb 6.2 oz (101.3 kg)  09/25/18 220 lb 1.6 oz (99.8 kg)  08/19/18 218 lb (98.9 kg)   BMI Readings from Last 3 Encounters:  12/24/18 39.26 kg/m  09/25/18 38.99 kg/m   08/19/18 38.62 kg/m    Hep C Screening: up to date  STD testing and prevention (HIV/chl/gon/syphilis): declines  Intimate partner violence: denies  Sexual History/Pain during Intercourse: sexually active, monogamous relationship, states has gradually has less of a desire.  Menstrual History/LMP/Abnormal Bleeding: had hysterectomy  Incontinence Symptoms: denies   Advanced Care Planning: A voluntary discussion about advance care planning including the explanation and discussion of advance directives.  Discussed health care proxy and Living will, and the patient was able to identify a health care proxy as husband, Micheal Muratore.  Patient does not have a living will at present time. If patient does have living will, I have requested they bring this to the clinic to be scanned in to their chart.  Breast cancer:  No results found for: [facility]   Cervical cancer screening: hysterectomy with cervix removal.   Osteoporosis Screening: takes supplements of vitamin D and calcium  Lipids:  Lab Results  Component Value Date   CHOL 118 09/25/2018   CHOL 160 06/25/2018   CHOL 152 11/23/2017   Lab Results  Component Value Date   HDL 45 (L) 09/25/2018   HDL 43 (L) 06/25/2018   HDL 37 (L) 11/23/2017  Lab Results  Component Value Date   LDLCALC 59 09/25/2018   LDLCALC 97 06/25/2018   LDLCALC 95 11/23/2017   Lab Results  Component Value Date   TRIG 62 09/25/2018   TRIG 103 06/25/2018   TRIG 105 11/23/2017   Lab Results  Component Value Date   CHOLHDL 2.6 09/25/2018   CHOLHDL 3.7 06/25/2018   CHOLHDL 4.1 11/23/2017   No results found for: LDLDIRECT  Glucose:  Glucose, Bld  Date Value Ref Range Status  07/08/2018 192 (H) 65 - 139 mg/dL Final    Comment:    .        Non-fasting reference interval .   06/25/2018 143 (H) 65 - 99 mg/dL Final    Comment:    .            Fasting reference interval . For someone without known diabetes, a glucose value >125 mg/dL indicates  that they may have diabetes and this should be confirmed with a follow-up test. .   11/23/2017 127 (H) 65 - 99 mg/dL Final    Comment:    .            Fasting reference interval . For someone without known diabetes, a glucose value >125 mg/dL indicates that they may have diabetes and this should be confirmed with a follow-up test. .     Skin cancer: discussed Colorectal cancer: she is due- had multiple polyps removed, recommended 5 year follow-up.  Lung cancer:   Low Dose CT Chest recommended if Age 51-80 years, 30 pack-year currently smoking OR have quit w/in 15years. Patient does not qualify.    Patient Active Problem List   Diagnosis Date Noted  . Uncontrolled type 2 diabetes mellitus with hyperglycemia, without long-term current use of insulin (HCC) 09/25/2018  . Anemia, iron deficiency 10/17/2017  . Breast cancer screening 09/08/2016  . Left shoulder pain 09/08/2016  . Encounter for medication monitoring 03/31/2016  . 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 Walnut allergy 08/30/2015  . Preventative health care 08/30/2015  . Hypercholesterolemia 04/27/2015  . Essential hypertension, benign 04/27/2015  . Microcytosis 04/27/2015  . Morbid obesity (HCC) 04/27/2015  . Gastroesophageal reflux disease without esophagitis 01/08/2015    Past Surgical History:  Procedure Laterality Date  . ABDOMINAL HYSTERECTOMY  2003   still has ovaries-for menorrhagia  . CESAREAN SECTION    . COLONOSCOPY  02/22/14   repeat 5 years  . ESOPHAGOGASTRODUODENOSCOPY  02/22/14  . TUBAL LIGATION      Family History  Problem Relation Age of Onset  . Diabetes Mother   . Heart disease Mother   . Hypertension Mother   . Stroke Mother   . Diabetes Father   . Hypertension Father   . Asthma Sister   . Diabetes Sister   . Hypertension Sister   . Lung disease Sister   . Hypertension Scott   . Heart disease Scott   . Stroke Scott   . Asthma Sister   . Diabetes Sister   . Hypertension  Sister   . Congestive Heart Failure Maternal Grandmother   . Cancer Paternal Grandfather        stomach cancer  . COPD Neg Hx     Social History   Socioeconomic History  . Marital status: Married    Spouse name: Not on file  . Number of children: Not on file  . Years of education: Not on file  . Highest education level: Not on file  Occupational History  .  Not on file  Social Needs  . Financial resource strain: Not on file  . Food insecurity:    Worry: Not on file    Inability: Not on file  . Transportation needs:    Medical: Not on file    Non-medical: Not on file  Tobacco Use  . Smoking status: Never Smoker  . Smokeless tobacco: Never Used  Substance and Sexual Activity  . Alcohol use: No  . Drug use: No  . Sexual activity: Yes  Lifestyle  . Physical activity:    Days per week: Not on file    Minutes per session: Not on file  . Stress: Not on file  Relationships  . Social connections:    Talks on phone: Not on file    Gets together: Not on file    Attends religious service: Not on file    Active member of club or organization: Not on file    Attends meetings of clubs or organizations: Not on file    Relationship status: Not on file  . Intimate partner violence:    Fear of current or ex partner: Not on file    Emotionally abused: Not on file    Physically abused: Not on file    Forced sexual activity: Not on file  Other Topics Concern  . Not on file  Social History Narrative  . Not on file     Current Outpatient Medications:  .  albuterol (PROVENTIL HFA;VENTOLIN HFA) 108 (90 BASE) MCG/ACT inhaler, Inhale 2 puffs into the lungs every 2 (two) hours as needed for wheezing or shortness of breath., Disp: , Rfl:  .  amLODipine (NORVASC) 5 MG tablet, TAKE 1 TABLET BY MOUTH EVERY DAY, Disp: 90 tablet, Rfl: 0 .  aspirin 81 MG tablet, Take 81 mg by mouth daily., Disp: , Rfl:  .  atorvastatin (LIPITOR) 80 MG tablet, TAKE 1 TABLET BY MOUTH EVERY DAY, Disp: 90 tablet,  Rfl: 1 .  cholecalciferol (VITAMIN D3) 25 MCG (1000 UT) tablet, Take 1,000 Units by mouth daily., Disp: , Rfl:  .  cyclobenzaprine (FLEXERIL) 5 MG tablet, Take 1 tablet (5 mg total) by mouth every 8 (eight) hours as needed for muscle spasms. Do not drive for 8 hours after taking, Disp: 21 tablet, Rfl: 0 .  EPINEPHrine (EPIPEN 2-PAK) 0.3 mg/0.3 mL IJ SOAJ injection, Inject 0.3 mLs (0.3 mg total) into the muscle once. For life-threatening emergency only, Disp: 1 Device, Rfl: 1 .  ezetimibe (ZETIA) 10 MG tablet, Take 1 tablet (10 mg total) by mouth daily., Disp: 30 tablet, Rfl: 11 .  fluticasone (FLOVENT HFA) 110 MCG/ACT inhaler, Inhale into the lungs 2 (two) times daily., Disp: , Rfl:  .  glipiZIDE (GLUCOTROL XL) 10 MG 24 hr tablet, Take 1 tablet (10 mg total) by mouth 2 (two) times daily., Disp: 180 tablet, Rfl: 1 .  linagliptin (TRADJENTA) 5 MG TABS tablet, Take 1 tablet (5 mg total) by mouth daily., Disp: , Rfl:  .  losartan (COZAAR) 50 MG tablet, Take 1 tablet (50 mg total) by mouth daily. For blood pressure and to protect kidneys; replaces lisinopril, Disp: 90 tablet, Rfl: 3 .  pioglitazone (ACTOS) 15 MG tablet, Take 1 tablet (15 mg total) by mouth daily., Disp: 30 tablet, Rfl: 5 .  ranitidine (ZANTAC) 150 MG tablet, TAKE 1 TABLET BY MOUTH 2 TIMES DAILY. IF NEEDED FOR ACID REFLUX, Disp: 180 tablet, Rfl: 1  Allergies  Allergen Reactions  . Metformin And Related Other (See Comments)  Rash, Thrush   . Other     walnuts     Review of Systems  Constitutional: Negative for chills, fever and malaise/fatigue.  HENT: Negative for congestion, sinus pain and sore throat.   Eyes: Negative for blurred vision and double vision.  Respiratory: Negative for cough and shortness of breath.   Cardiovascular: Negative for chest pain, palpitations and leg swelling.  Gastrointestinal: Negative for abdominal pain, blood in stool, constipation, diarrhea, nausea and vomiting.  Genitourinary: Negative for  dysuria and hematuria.  Musculoskeletal: Positive for joint pain (right knee chronic pain). Negative for falls.  Skin: Negative for rash.  Neurological: Negative for dizziness, tingling and headaches.  Endo/Heme/Allergies: Negative for polydipsia.  Psychiatric/Behavioral: Negative for depression. The patient has insomnia (PRN resolved melatonin). The patient is not nervous/anxious.       Objective  Vitals:   12/24/18 0907  BP: 122/74  Pulse: 90  Resp: 14  Temp: 98.2 F (36.8 C)  TempSrc: Oral  SpO2: 95%  Weight: 223 lb 6.2 oz (101.3 kg)  Height: 5' 3.25" (1.607 m)    Body mass index is 39.26 kg/m.  Physical Exam  Constitutional: Patient appears well-developed and well-nourished. No distress.  HENT: Head: Normocephalic and atraumatic. Ears: B TMs ok, no erythema or effusion; masked. Eyes: Conjunctivae and EOM are normal. Pupils are equal, round, and reactive to light. No scleral icterus.  Neck: Normal range of motion. Neck supple. No JVD present. No thyromegaly present.  Cardiovascular: Normal rate, regular rhythm and normal heart sounds.  No murmur heard. No BLE edema. Pulmonary/Chest: Effort normal and breath sounds normal. No respiratory distress. Abdominal: Soft. Bowel sounds are normal, no distension. There is no tenderness. no masses Breast: no lumps or masses, no nipple discharge or rashes Musculoskeletal: Normal range of motion, no joint effusions. No gross deformities Neurological: he is alert and oriented to person, place, and time. No cranial nerve deficit. Coordination, balance, strength, speech and gait are normal.  Skin: Skin is warm and dry. No rash noted. No erythema.  Psychiatric: Patient has a normal mood and affect. behavior is normal. Judgment and thought content normal.  No results found for this or any previous visit (from the past 2160 hour(s)).  Diabetic Foot Exam: Diabetic Foot Exam - Simple   Simple Foot Form Diabetic Foot exam was performed  with the following findings:  Yes 12/24/2018 11:54 AM  Visual Inspection No deformities, no ulcerations, no other skin breakdown bilaterally:  Yes Sensation Testing Intact to touch and monofilament testing bilaterally:  Yes Pulse Check Posterior Tibialis and Dorsalis pulse intact bilaterally:  Yes Comments      PHQ2/9: [facility] 2/[facility] 12/24/2018 09/25/2018 08/13/2018 06/25/2018 11/23/2017  Decreased Interest 0 0 0 0 0  Down, Depressed, Hopeless 0 0 0 0 0  PHQ - 2 Score 0 0 0 0 0  Altered sleeping 0 0 0 0 -  Tired, decreased energy 0 0 0 0 -  Change in appetite 0 0 0 0 -  Feeling bad or failure about yourself  0 0 0 0 -  Trouble concentrating 0 0 0 0 -  Moving slowly or fidgety/restless 0 0 0 0 -  Suicidal thoughts 0 0 0 0 -  PHQ-9 Score 0 0 0 0 -  Difficult doing work/chores Not difficult at all Not difficult at all Not difficult at all Not difficult at all -    Fall Risk: Fall Risk  12/24/2018 09/25/2018 08/13/2018 06/25/2018 11/23/2017  Falls in the past  year? 0 0 0 0 No  Number falls in past yr: - 0 0 0 -  Injury with Fall? - 0 0 - -  Follow up - - - - -     Functional Status Survey: Is the patient deaf or have difficulty hearing?: No Does the patient have difficulty seeing, even when wearing glasses/contacts?: No Does the patient have difficulty concentrating, remembering, or making decisions?: No Does the patient have difficulty walking or climbing stairs?: No Does the patient have difficulty dressing or bathing?: No Does the patient have difficulty doing errands alone such as visiting a doctor's office or shopping?: No   Assessment & Plan  1. Routine general medical examination at a health care facility - HgB A1c - COMPLETE METABOLIC PANEL WITH GFR  2. Screening for breast cancer Mammogram when routine screening opens   3. Screening for colorectal cancer When routine screening opens  - Ambulatory referral to Gastroenterology  4. Type 2 diabetes  mellitus without complication, without long-term current use of insulin (HCC) Discussed diet  - HgB A1c  5. Medication management  - COMPLETE METABOLIC PANEL WITH GFR  -USPSTF grade A and B recommendations reviewed with patient; age-appropriate recommendations, preventive care, screening tests, etc discussed and encouraged; healthy living encouraged; see AVS for patient education given to patient -Discussed importance of 150 minutes of physical activity weekly, eat two servings of fish weekly, eat one serving of tree nuts ( cashews, pistachios, pecans, almonds.Marland Kitchen) every other day, eat 6 servings of fruit/vegetables daily and drink plenty of water and avoid sweet beverages.   -Reviewed Health Maintenance: has eye exam scheduled next month, number to schedule mammogram when routine scheduling opens up.

## 2018-12-25 LAB — COMPLETE METABOLIC PANEL WITH GFR
AG Ratio: 1.5 (calc) (ref 1.0–2.5)
ALT: 15 U/L (ref 6–29)
AST: 14 U/L (ref 10–35)
Albumin: 4 g/dL (ref 3.6–5.1)
Alkaline phosphatase (APISO): 74 U/L (ref 37–153)
BUN: 13 mg/dL (ref 7–25)
CO2: 29 mmol/L (ref 20–32)
Calcium: 9.2 mg/dL (ref 8.6–10.4)
Chloride: 101 mmol/L (ref 98–110)
Creat: 0.72 mg/dL (ref 0.50–1.05)
GFR, Est African American: 108 mL/min/{1.73_m2} (ref >=60)
GFR, Est Non African American: 94 mL/min/{1.73_m2} (ref >=60)
Globulin: 2.6 g/dL (calc) (ref 1.9–3.7)
Glucose, Bld: 155 mg/dL — ABNORMAL HIGH (ref 65–99)
Potassium: 3.8 mmol/L (ref 3.5–5.3)
Sodium: 137 mmol/L (ref 135–146)
Total Bilirubin: 1.1 mg/dL (ref 0.2–1.2)
Total Protein: 6.6 g/dL (ref 6.1–8.1)

## 2018-12-25 LAB — HEMOGLOBIN A1C
Hgb A1c MFr Bld: 6.4 % of total Hgb — ABNORMAL HIGH (ref <5.7)
Mean Plasma Glucose: 137 (calc)
eAG (mmol/L): 7.6 (calc)

## 2018-12-26 ENCOUNTER — Other Ambulatory Visit: Payer: Self-pay

## 2018-12-26 ENCOUNTER — Telehealth: Payer: Self-pay

## 2018-12-26 DIAGNOSIS — Z1211 Encounter for screening for malignant neoplasm of colon: Secondary | ICD-10-CM

## 2018-12-26 NOTE — Telephone Encounter (Signed)
Gastroenterology Pre-Procedure Review  Request Date: 02/04/19 Requesting Physician: Dr. Servando Snare  PATIENT REVIEW QUESTIONS: The patient responded to the following health history questions as indicated:    1. Are you having any GI issues? no 2. Do you have a personal history of Polyps? yes (DOESN'T RECALL WHAT YEAR IT WAS) 3. Do you have a family history of Colon Cancer or Polyps? no 4. Diabetes Mellitus? yes (ORAL MEDS) 5. Joint replacements in the past 12 months?no 6. Major health problems in the past 3 months?no 7. Any artificial heart valves, MVP, or defibrillator?no    MEDICATIONS & ALLERGIES:    Patient reports the following regarding taking any anticoagulation/antiplatelet therapy:   Plavix, Coumadin, Eliquis, Xarelto, Lovenox, Pradaxa, Brilinta, or Effient? no Aspirin? no  Patient confirms/reports the following medications:  Current Outpatient Medications  Medication Sig Dispense Refill  . albuterol (PROVENTIL HFA;VENTOLIN HFA) 108 (90 BASE) MCG/ACT inhaler Inhale 2 puffs into the lungs every 2 (two) hours as needed for wheezing or shortness of breath.    Marland Kitchen amLODipine (NORVASC) 5 MG tablet TAKE 1 TABLET BY MOUTH EVERY DAY 90 tablet 0  . aspirin 81 MG tablet Take 81 mg by mouth daily.    Marland Kitchen atorvastatin (LIPITOR) 80 MG tablet TAKE 1 TABLET BY MOUTH EVERY DAY 90 tablet 1  . cholecalciferol (VITAMIN D3) 25 MCG (1000 UT) tablet Take 1,000 Units by mouth daily.    . cyclobenzaprine (FLEXERIL) 5 MG tablet Take 1 tablet (5 mg total) by mouth every 8 (eight) hours as needed for muscle spasms. Do not drive for 8 hours after taking 21 tablet 0  . EPINEPHrine (EPIPEN 2-PAK) 0.3 mg/0.3 mL IJ SOAJ injection Inject 0.3 mLs (0.3 mg total) into the muscle once. For life-threatening emergency only 1 Device 1  . ezetimibe (ZETIA) 10 MG tablet Take 1 tablet (10 mg total) by mouth daily. 30 tablet 11  . fluticasone (FLOVENT HFA) 110 MCG/ACT inhaler Inhale into the lungs 2 (two) times daily.    Marland Kitchen  glipiZIDE (GLUCOTROL XL) 10 MG 24 hr tablet Take 1 tablet (10 mg total) by mouth 2 (two) times daily. 180 tablet 1  . linagliptin (TRADJENTA) 5 MG TABS tablet Take 1 tablet (5 mg total) by mouth daily.    Marland Kitchen losartan (COZAAR) 50 MG tablet Take 1 tablet (50 mg total) by mouth daily. For blood pressure and to protect kidneys; replaces lisinopril 90 tablet 3  . pioglitazone (ACTOS) 15 MG tablet Take 1 tablet (15 mg total) by mouth daily. 30 tablet 5  . ranitidine (ZANTAC) 150 MG tablet TAKE 1 TABLET BY MOUTH 2 TIMES DAILY. IF NEEDED FOR ACID REFLUX 180 tablet 1   No current facility-administered medications for this visit.     Patient confirms/reports the following allergies:  Allergies  Allergen Reactions  . Metformin And Related Other (See Comments)    Rash, Thrush   . Other     walnuts    No orders of the defined types were placed in this encounter.   AUTHORIZATION INFORMATION Primary Insurance: 1D#: Group #:  Secondary Insurance: 1D#: Group #:  SCHEDULE INFORMATION: Date: 02/04/19 Time: Location:[facility]

## 2019-01-03 ENCOUNTER — Other Ambulatory Visit: Payer: Self-pay | Admitting: Family Medicine

## 2019-01-04 ENCOUNTER — Other Ambulatory Visit: Payer: Self-pay | Admitting: Family Medicine

## 2019-01-24 ENCOUNTER — Ambulatory Visit: Payer: 59 | Admitting: Family Medicine

## 2019-01-26 ENCOUNTER — Other Ambulatory Visit: Payer: Self-pay | Admitting: Nurse Practitioner

## 2019-01-30 ENCOUNTER — Telehealth: Payer: Self-pay | Admitting: Gastroenterology

## 2019-01-30 NOTE — Telephone Encounter (Signed)
Pt is calling to cancel her procedure due to her Job she will call us back to r/s

## 2019-01-30 NOTE — Telephone Encounter (Signed)
Patients colonoscopy has been canceled with Trish in Endoscopy. PreAdmit testing has been informed.  Thanks [employer]

## 2019-01-31 ENCOUNTER — Other Ambulatory Visit
Admission: RE | Admit: 2019-01-31 | Discharge: 2019-01-31 | Disposition: A | Discharge status: Home / Self Care | Payer: 59 | Source: Ambulatory Visit | Attending: Gastroenterology | Admitting: Gastroenterology

## 2019-02-04 ENCOUNTER — Ambulatory Visit: Admission: RE | Admit: 2019-02-04 | Payer: 59 | Source: Home / Self Care | Admitting: Gastroenterology

## 2019-02-04 ENCOUNTER — Encounter: Admission: RE | Payer: Self-pay | Source: Home / Self Care

## 2019-02-04 SURGERY — COLONOSCOPY WITH PROPOFOL
Anesthesia: General

## 2019-02-21 ENCOUNTER — Other Ambulatory Visit: Payer: Self-pay | Admitting: Family Medicine

## 2019-03-17 ENCOUNTER — Other Ambulatory Visit: Payer: Self-pay | Admitting: Nurse Practitioner

## 2019-04-24 ENCOUNTER — Other Ambulatory Visit: Payer: Self-pay | Admitting: Family Medicine

## 2019-04-24 NOTE — Telephone Encounter (Signed)
Requested medication (s) are due for refill today: yes  Requested medication (s) are on the active medication list: yes  Last refill:  01/25/2019  Future visit scheduled:yes  Notes to clinic:  Ordering provider and pcp are different   Requested Prescriptions  Pending Prescriptions Disp Refills   losartan (COZAAR) 50 MG tablet [Pharmacy Med Name: LOSARTAN POTASSIUM 50 MG TAB] 90 tablet 2    Sig: TAKE 1 TABLET BY MOUTH EVERY DAY     Cardiovascular:  Angiotensin Receptor Blockers Passed - 04/24/2019  1:31 AM      Passed - Cr in normal range and within 180 days    Creat  Date Value Ref Range Status  12/24/2018 0.72 0.50 - 1.05 mg/dL Final    Comment:    For patients >46 years of age, the reference limit for Creatinine is approximately 13% higher for people identified as African-American. .          Passed - K in normal range and within 180 days    Potassium  Date Value Ref Range Status  12/24/2018 3.8 3.5 - 5.3 mmol/L Final         Passed - Patient is not pregnant      Passed - Last BP in normal range    BP Readings from Last 1 Encounters:  12/24/18 122/74         Passed - Valid encounter within last 6 months    Recent Outpatient Visits          4 months ago Routine general medical examination at a health care facility   [place], [place], NP   7 months ago Type 2 diabetes mellitus without complication, without long-term current use of insulin [facility])   [place], Satira Anis, MD   8 months ago Baker's cyst of knee, right   [place], Satira Anis, MD   10 months ago Type 2 diabetes mellitus without complication, without long-term current use of insulin [facility])   [place], Satira Anis, MD   1 year ago Type 2 diabetes mellitus without complication, without long-term current use of insulin [facility])   [place], Satira Anis, MD      Future  Appointments            In 2 months Delsa Grana, [name] [facility], [facility]

## 2019-05-03 IMAGING — US US EXTREM LOW*R* LIMITED
1 series · 8 of 8 positions shown · non-contrast
Comparison: None.

CLINICAL DATA: Initial evaluation for posterior right knee pain and
swelling.

EXAM:
ULTRASOUND RIGHT LOWER EXTREMITY LIMITED
TECHNIQUE: Ultrasound examination of the lower extremity soft tissues was
performed in the area of clinical concern.

[Series 1: us extrem low*right* limited · 8 of 8 slices shown]
[im 1/8]
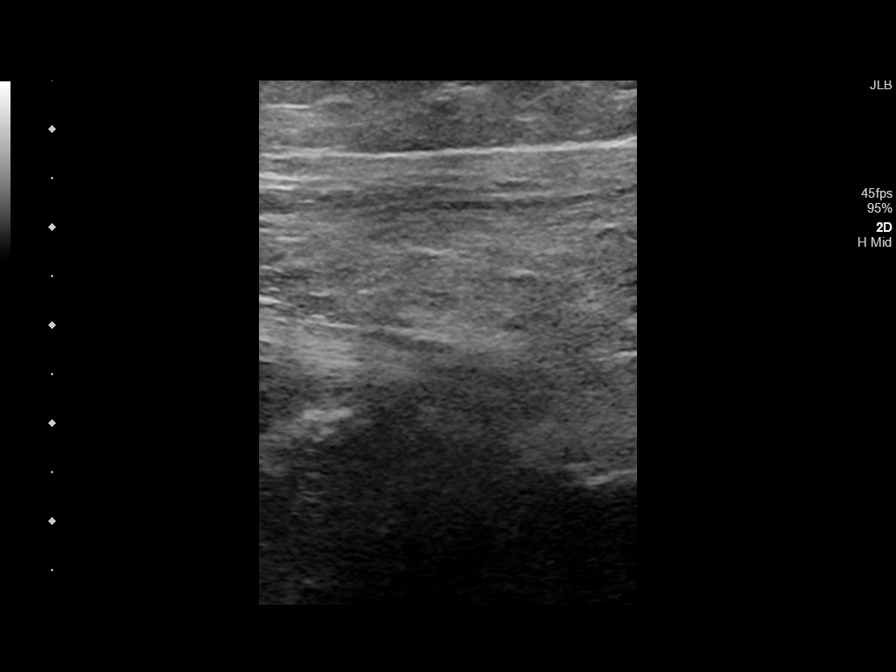
[im 2/8]
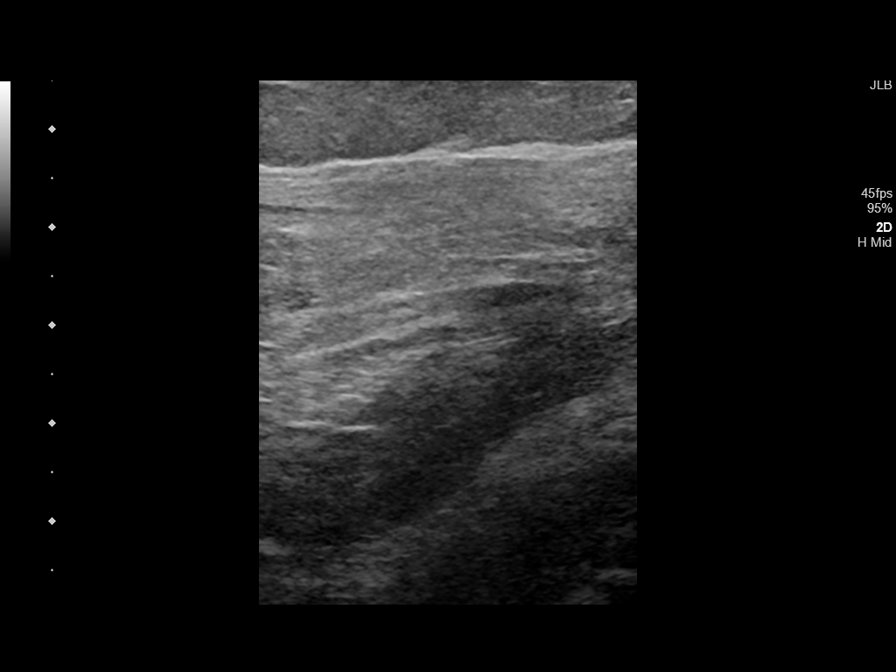
[im 3/8]
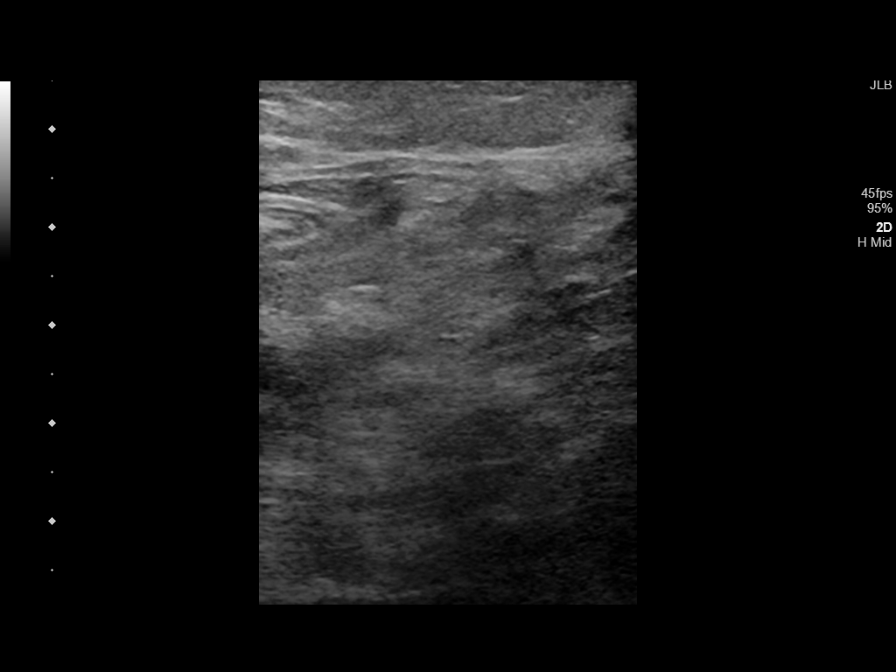
[im 4/8]
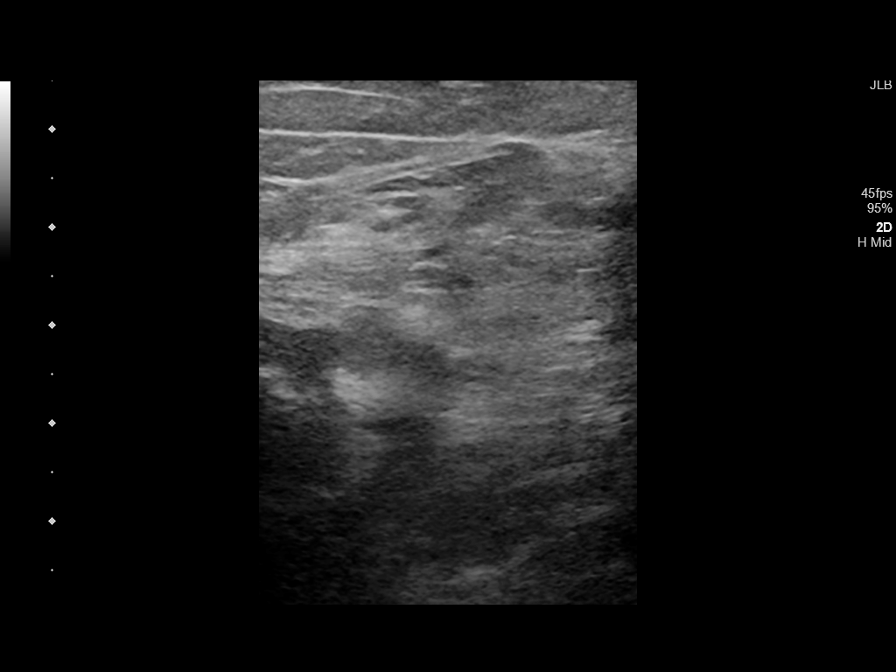
[im 5/8]
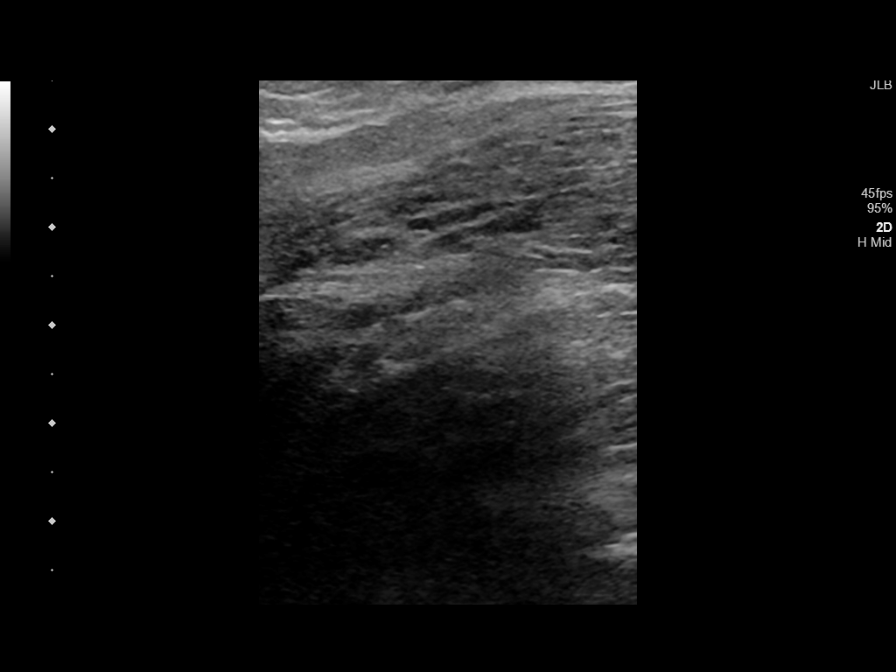
[im 6/8]
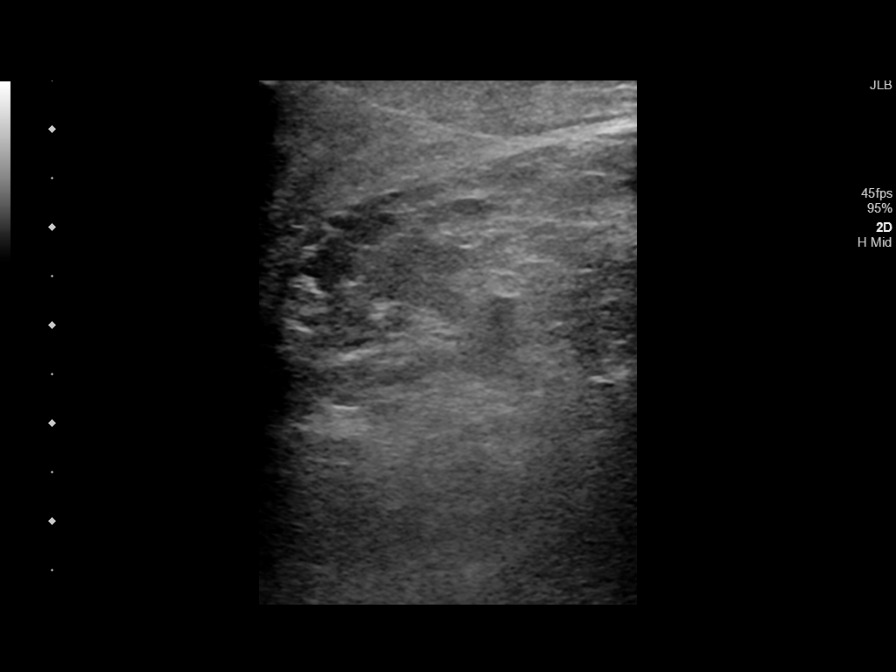
[im 7/8]
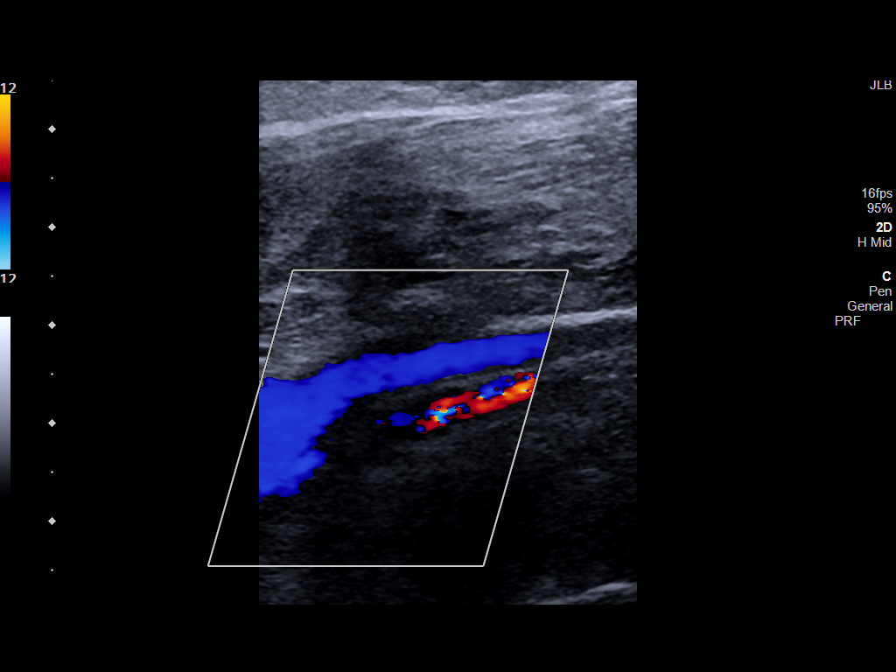
[im 8/8]
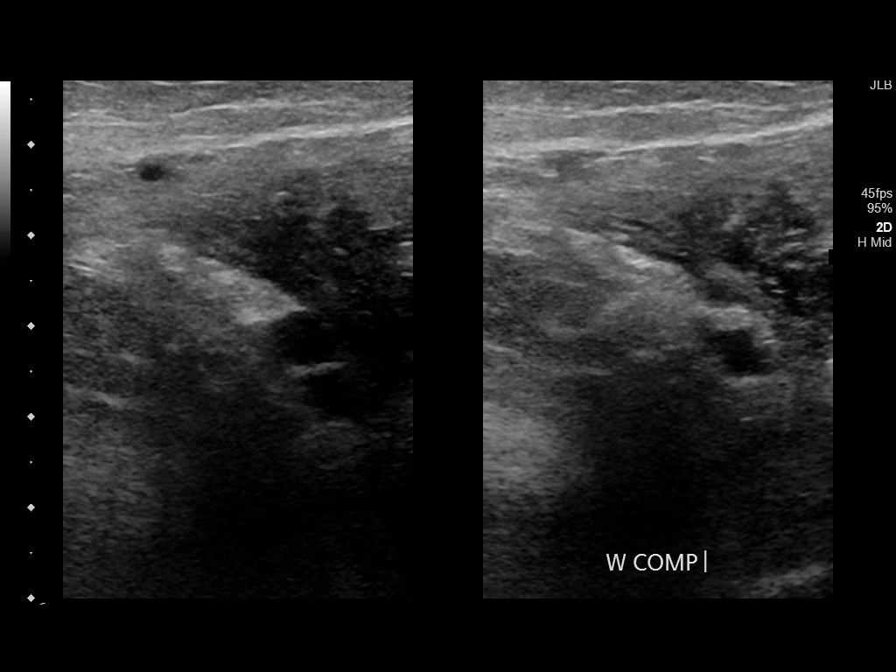

[8 of 8 positions shown; findings below may reference images not displayed]

FINDINGS: Targeted ultrasound of the soft tissues posterior to the right knee
was performed. Ultrasound demonstrates no collection to suggest
Baker cyst. No soft tissue mass or other abnormality identified. No
findings to explain patient's symptoms.
IMPRESSION: Negative ultrasound. No loculated collection to suggest Baker's
cyst. No other abnormality identified to explain patient's symptoms.

## 2019-05-25 ENCOUNTER — Other Ambulatory Visit: Payer: Self-pay | Admitting: Family Medicine

## 2019-06-28 ENCOUNTER — Other Ambulatory Visit: Payer: Self-pay | Admitting: Family Medicine

## 2019-06-28 NOTE — Telephone Encounter (Signed)
Requested medication (s) are due for refill today: yes  Requested medication (s) are on the active medication list: yes  Last refill:  05/05/2019  Future visit scheduled: yes  Notes to clinic:  Review for refill Patient has appointment on 06/30/2019   Requested Prescriptions  Pending Prescriptions Disp Refills   glipiZIDE (GLUCOTROL XL) 10 MG 24 hr tablet [Pharmacy Med Name: GLIPIZIDE ER 10 MG TABLET] 180 tablet 1    Sig: TAKE 1 [place] A DAY     Endocrinology:  Diabetes - Sulfonylureas Failed - 06/28/2019 10:47 AM      Failed - HBA1C is between 0 and 7.9 and within 180 days    Hgb A1c MFr Bld  Date Value Ref Range Status  12/24/2018 6.4 (H) <5.7 % of total Hgb Final    Comment:    For someone without known diabetes, a hemoglobin  A1c value between 5.7% and 6.4% is consistent with prediabetes and should be confirmed with a  follow-up test. . For someone with known diabetes, a value <7% indicates that their diabetes is well controlled. A1c targets should be individualized based on duration of diabetes, age, comorbid conditions, and other considerations. . This assay result is consistent with an increased risk of diabetes. . Currently, no consensus exists regarding use of hemoglobin A1c for diagnosis of diabetes for children. .          Failed - Valid encounter within last 6 months    Recent Outpatient Visits          6 months ago Routine general medical examination at a health care facility   [place], NP   9 months ago Type 2 diabetes mellitus without complication, without long-term current use of insulin [facility])   [place], Satira Anis, MD   10 months ago Baker's cyst of knee, right   [place], Satira Anis, MD   1 year ago Type 2 diabetes mellitus without complication, without long-term current use of insulin [facility])   [place],  Satira Anis, MD   1 year ago Type 2 diabetes mellitus without complication, without long-term current use of insulin [facility])   [place], Satira Anis, MD      Future Appointments            In 2 days Delsa Grana, [name] [facility], [facility]

## 2019-06-30 ENCOUNTER — Ambulatory Visit (INDEPENDENT_AMBULATORY_CARE_PROVIDER_SITE_OTHER): Payer: 59 | Admitting: Family Medicine

## 2019-06-30 ENCOUNTER — Other Ambulatory Visit: Payer: Self-pay

## 2019-06-30 ENCOUNTER — Encounter: Payer: Self-pay | Admitting: Family Medicine

## 2019-06-30 VITALS — BP 132/87 | HR 97 | Temp 98.1°F | Resp 14 | Ht 63.0 in | Wt 223.0 lb

## 2019-06-30 DIAGNOSIS — E785 Hyperlipidemia, unspecified: Secondary | ICD-10-CM

## 2019-06-30 DIAGNOSIS — Z1231 Encounter for screening mammogram for malignant neoplasm of breast: Secondary | ICD-10-CM

## 2019-06-30 DIAGNOSIS — K219 Gastro-esophageal reflux disease without esophagitis: Secondary | ICD-10-CM | POA: Diagnosis not present

## 2019-06-30 DIAGNOSIS — I10 Essential (primary) hypertension: Secondary | ICD-10-CM

## 2019-06-30 DIAGNOSIS — Z1212 Encounter for screening for malignant neoplasm of rectum: Secondary | ICD-10-CM

## 2019-06-30 DIAGNOSIS — Z79899 Other long term (current) drug therapy: Secondary | ICD-10-CM

## 2019-06-30 DIAGNOSIS — Z1211 Encounter for screening for malignant neoplasm of colon: Secondary | ICD-10-CM

## 2019-06-30 DIAGNOSIS — E119 Type 2 diabetes mellitus without complications: Secondary | ICD-10-CM

## 2019-06-30 DIAGNOSIS — J329 Chronic sinusitis, unspecified: Secondary | ICD-10-CM

## 2019-06-30 DIAGNOSIS — D582 Other hemoglobinopathies: Secondary | ICD-10-CM

## 2019-06-30 MED ORDER — PIOGLITAZONE HCL 15 MG PO TABS: 15.0000 mg | ORAL_TABLET | Freq: Every day | ORAL | 1 refills | Status: DC

## 2019-06-30 MED ORDER — OMEPRAZOLE 20 MG PO CPDR: 20.0000 mg | DELAYED_RELEASE_CAPSULE | Freq: Two times a day (BID) | ORAL | 2 refills | Status: DC

## 2019-06-30 MED ORDER — AMOXICILLIN-POT CLAVULANATE 875-125 MG PO TABS: 1.0000 | ORAL_TABLET | Freq: Two times a day (BID) | ORAL | 0 refills | Status: AC

## 2019-06-30 MED ORDER — ATORVASTATIN CALCIUM 80 MG PO TABS: 80.0000 mg | ORAL_TABLET | Freq: Every day | ORAL | 1 refills | Status: DC

## 2019-06-30 MED ORDER — LINAGLIPTIN 5 MG PO TABS: 5.0000 mg | ORAL_TABLET | Freq: Every day | ORAL | 1 refills | Status: DC

## 2019-06-30 MED ORDER — GLIPIZIDE ER 10 MG PO TB24: 10.0000 mg | ORAL_TABLET | Freq: Two times a day (BID) | ORAL | 1 refills | Status: DC

## 2019-06-30 MED ORDER — EZETIMIBE 10 MG PO TABS: 10.0000 mg | ORAL_TABLET | Freq: Every day | ORAL | 1 refills | Status: DC

## 2019-06-30 MED ORDER — AMLODIPINE BESYLATE 5 MG PO TABS: 5.0000 mg | ORAL_TABLET | Freq: Every day | ORAL | 1 refills | Status: DC

## 2019-06-30 MED ORDER — FLUCONAZOLE 150 MG PO TABS: 150.0000 mg | ORAL_TABLET | Freq: Once | ORAL | 0 refills | Status: AC

## 2019-06-30 NOTE — Progress Notes (Signed)
Name: Elizabeth Scott   MRN: [id number]    DOB: 22-Jul-1961   Date:06/30/2019       Progress Note  Chief Complaint  Patient presents with  . Follow-up  . Diabetes  . Hypertension  . Hyperlipidemia  . Medication Refill  . Sinus Problem     Subjective:   Elizabeth Scott is a 58 y.o. female, presents to clinic for routine follow up on the conditions listed above.  Pt new to me, last seen 6 months ago for CPE, prior to that saw her last PCP for routine f/up in Feb 2020  Diabetes Mellitus Type II:  Currently managing with tradjenta and glipizide 10 mg BID, actos 15 mg Good med compliance Pt has no SE from meds. Not checking CBGs per her last PCP- although on a sulfonylurea  Pt denies hypoglycemic episodes per her report Denies: Polyuria, polydipsia, polyphagia, vision changes, or neuropathy Recent pertinent labs: Lab Results  Component Value Date   HGBA1C 6.4 (H) 12/24/2018   HGBA1C 6.4 (H) 09/25/2018   HGBA1C 7.6 (H) 06/25/2018   Current diet: in general, a "healthy" diet   Current exercise: none UTD on DM foot exam and eye exam - need records from eye exam - pt states done April 2020 at [facility] ACEI/ARB: Yes Statin: Yes  Pt has albuterol and ICS inhaler on chart - no dx of asthma, COPD or emphysema, she states she developed chronic cough after pnuemonia about 2 years ago, she uses SABA occasionally, not using ICS - triggered by chronic allergies and post nasal drip.   She seeing ENT - has deviated septum, chronic rhinosinusitis  She had surgery deferred due to covid Last abx was sept 2019 - augmentin  She is compliant with her allergy meds, complains of worsening sinus congestion, drainage and pressure to her left maxillary sinus onset about 2 to 3 weeks ago has been gradually worsening, usually requires antibiotics.  She denies any fever.  Her septum is deviated to the left where she is more obstructed where she has the most pain does radiate into her eye.     Hypertension:  Currently managed on amlodipine 5 mg and losartan 50 mg daily Pt reports good med compliance and denies any SE.  No lightheadedness, hypotension, syncope. Blood pressure today is well controlled. BP Readings from Last 3 Encounters:  06/30/19 132/87  12/24/18 122/74  09/25/18 134/82  Pt denies CP, SOB, exertional sx, LE edema, palpitation, Ha's, visual disturbances Dietary efforts for BP?  Cutting out salt, healthy diet overall some limitations with exercise due to right knee pain  GERD:  - Current medication regimen: prilosec 20 mg BID - Possible Triggers: very few food triggers - Endorses: no sx when on meds, if she decreases doses or skips doses she will get indigestion and acid reflux - Denies: abdominal pain, cough, weight loss, dysphagia, black stools, hematemesis, diarrhea, constipation, history of PUD or history of GI bleeding  Patient is due for her colonoscopy she had multiple polyps removed was due for 5-year follow-up, did cancel earlier this year due to to Covid pandemic.  She agrees to go to GI to complete her colonoscopy.  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  Last time it was done had not been and anesthesia was not covered by her insurance she would like to go to [facility] for any procedures  Mammogram also overdue and ordered.      Patient Active Problem List   Diagnosis Date Noted  . Uncontrolled type 2 diabetes mellitus with hyperglycemia, without long-term current use of insulin (HCC) 09/25/2018  . Anemia, iron deficiency 10/17/2017  . Breast cancer screening 09/08/2016  . Left shoulder pain 09/08/2016  . Encounter for medication  monitoring 03/31/2016  . 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 Walnut allergy 08/30/2015  . Preventative health care 08/30/2015  . Hypercholesterolemia 04/27/2015  . Essential hypertension, benign 04/27/2015  . Microcytosis 04/27/2015  . Morbid obesity (HCC) 04/27/2015  . Gastroesophageal reflux disease without esophagitis 01/08/2015    Past Surgical History:  Procedure Laterality Date  . ABDOMINAL HYSTERECTOMY  2003   still has ovaries-for menorrhagia  . CESAREAN SECTION    . COLONOSCOPY  02/22/14   repeat 5 years  . ESOPHAGOGASTRODUODENOSCOPY  02/22/14  . TUBAL LIGATION      Family History  Problem Relation Age of Onset  . Diabetes Mother   . Heart disease Mother   . Hypertension Mother   . Stroke Mother   . Diabetes Father   . Hypertension Father   . Asthma Sister   . Diabetes Sister   . Hypertension Sister   . Lung disease Sister   . Hypertension Brother   . Heart disease Brother   . Stroke Brother   . Asthma Sister   . Diabetes Sister   . Hypertension Sister   . Congestive Heart Failure Maternal Grandmother   . Cancer Paternal Grandfather        stomach cancer  . COPD Neg Hx     Social History   Socioeconomic History  . Marital status: Married    Spouse name: Not on file  . Number of children: Not on file  . Years of education: Not on file  . Highest education level: Not on file  Occupational History  . Not on file  Social Needs  . Financial resource strain: Not on file  . Food insecurity    Worry: Not on file    Inability: Not on file  . Transportation needs    Medical: Not on file    Non-medical: Not on file  Tobacco Use  . Smoking status: Never Smoker  . Smokeless tobacco: Never Used  Substance and Sexual Activity  . Alcohol use: No  . Drug use: No  . Sexual activity: Yes  Lifestyle  . Physical activity    Days per week: Not on file    Minutes per session: Not on file  . Stress: Not on file  Relationships  . Social Wellsite geologistconnections     Talks on phone: Not on file    Gets together: Not on file    Attends religious service: Not on file    Active member of club or organization: Not on file    Attends meetings of clubs or organizations: Not on file    Relationship status: Not on file  . Intimate partner violence    Fear of current or ex partner: Not on file    Emotionally abused: Not on file    Physically abused: Not on file    Forced sexual activity: Not on file  Other Topics Concern  . Not on file  Social History Narrative  . Not on file     Current Outpatient Medications:  .  albuterol (PROVENTIL HFA;VENTOLIN HFA) 108 (90 BASE) MCG/ACT inhaler, Inhale 2 puffs into the lungs every 2 (two) hours as needed for wheezing or shortness of breath., Disp: , Rfl:  .  amLODipine (NORVASC) 5 MG tablet, TAKE 1 TABLET BY MOUTH EVERY DAY, Disp: 90 tablet, Rfl: 0 .  aspirin 81 MG tablet, Take 81 mg by mouth daily., Disp: , Rfl:  .  atorvastatin (LIPITOR) 80 MG tablet, TAKE 1 TABLET BY MOUTH EVERY DAY, Disp: 90 tablet, Rfl: 1 .  cholecalciferol (VITAMIN D3) 25 MCG (1000 UT) tablet, Take 1,000 Units by mouth daily., Disp: , Rfl:  .  cyclobenzaprine (FLEXERIL) 5 MG tablet, Take 1 tablet (5 mg total) by mouth every 8 (eight) hours as needed for muscle spasms. Do not drive for 8 hours after taking, Disp: 21 tablet, Rfl: 0 .  EPINEPHrine (EPIPEN 2-PAK) 0.3 mg/0.3 mL IJ SOAJ injection, Inject 0.3 mLs (0.3 mg total) into the muscle once. For life-threatening emergency only, Disp: 1 Device, Rfl: 1 .  ezetimibe (ZETIA) 10 MG tablet, Take 1 tablet (10 mg total) by mouth daily., Disp: 30 tablet, Rfl: 11 .  fluticasone (FLOVENT HFA) 110 MCG/ACT inhaler, Inhale into the lungs 2 (two) times daily., Disp: , Rfl:  .  glipiZIDE (GLUCOTROL XL) 10 MG 24 hr tablet, TAKE 1 TABLET BY MOUTH TWICE A DAY, Disp: 180 tablet, Rfl: 1 .  losartan (COZAAR) 50 MG tablet, TAKE 1 TABLET BY MOUTH EVERY DAY, Disp: 90 tablet, Rfl: 2 .  pioglitazone (ACTOS) 15 MG tablet,  TAKE 1 TABLET BY MOUTH EVERY DAY, Disp: 90 tablet, Rfl: 1 .  ranitidine (ZANTAC) 150 MG tablet, TAKE 1 TABLET BY MOUTH 2 TIMES DAILY. IF NEEDED FOR ACID REFLUX, Disp: 180 tablet, Rfl: 1 .  TRADJENTA 5 MG TABS tablet, TAKE 1 TABLET BY MOUTH EVERY DAY, Disp: 90 tablet, Rfl: 1  Allergies  Allergen Reactions  . Metformin And Related Other (See Comments)    Rash, Thrush   . Other     walnuts    I personally reviewed active problem list, medication list, allergies, family history, social history, health maintenance, notes from last encounter, lab results, imaging with the patient/caregiver today.   Review of Systems  Constitutional: Negative.   HENT: Negative.   Eyes: Negative.   Respiratory: Negative.   Cardiovascular: Negative.   Gastrointestinal: Negative.   Endocrine: Negative.   Genitourinary: Negative.   Musculoskeletal: Negative.   Skin: Negative.   Allergic/Immunologic: Negative.   Neurological: Negative.   Hematological: Negative.   Psychiatric/Behavioral: Negative.   All other systems reviewed and are negative.    Objective:    Vitals:   06/30/19 1057  BP: 132/87  Pulse: 97  Resp: 14  Temp: 98.1 F (36.7 C)  SpO2: 97%  Weight: 223 lb (101.2 kg)  Height:  (1.6 m)    Body mass index is 39.5 kg/m.  Physical Exam Vitals signs and nursing note reviewed.  Constitutional:      General: She is not in acute distress.    Appearance: Normal appearance. She is well-developed. She is not ill-appearing, toxic-appearing or diaphoretic.     Interventions: Face mask in place.  HENT:     Head: Normocephalic and atraumatic.     Right Ear: External ear normal.     Left Ear: External ear normal.  Eyes:     General: Lids are normal. No  scleral icterus.       Right eye: No discharge.        Left eye: No discharge.     Conjunctiva/sclera: Conjunctivae normal.  Neck:     Musculoskeletal: Normal range of motion and neck supple.     Trachea: Phonation normal. No  tracheal deviation.  Cardiovascular:     Rate and Rhythm: Normal rate and regular rhythm.     Pulses: Normal pulses.          Radial pulses are 2+ on the right side and 2+ on the left side.       Posterior tibial pulses are 2+ on the right side and 2+ on the left side.     Heart sounds: Normal heart sounds. No murmur. No friction rub. No gallop.   Pulmonary:     Effort: Pulmonary effort is normal. No respiratory distress.     Breath sounds: Normal breath sounds. No stridor. No wheezing, rhonchi or rales.  Chest:     Chest wall: No tenderness.  Abdominal:     General: Bowel sounds are normal. There is no distension.     Palpations: Abdomen is soft.     Tenderness: There is no abdominal tenderness. There is no guarding or rebound.  Musculoskeletal: Normal range of motion.        General: No deformity.     Right lower leg: No edema.     Left lower leg: No edema.  Lymphadenopathy:     Cervical: No cervical adenopathy.  Skin:    General: Skin is warm and dry.     Capillary Refill: Capillary refill takes less than 2 seconds.     Coloration: Skin is not jaundiced or pale.     Findings: No rash.  Neurological:     Mental Status: She is alert and oriented to person, place, and time.     Motor: No abnormal muscle tone.     Gait: Gait normal.  Psychiatric:        Speech: Speech normal.        Behavior: Behavior normal.      No results found for this or any previous visit (from the past 2160 hour(s)).   PHQ2/9: Depression screen [facility] 2/9 06/30/2019 12/24/2018 09/25/2018 08/13/2018 06/25/2018  Decreased Interest 0 0 0 0 0  Down, Depressed, Hopeless 0 0 0 0 0  PHQ - 2 Score 0 0 0 0 0  Altered sleeping 0 0 0 0 0  Tired, decreased energy 0 0 0 0 0  Change in appetite 0 0 0 0 0  Feeling bad or failure about yourself  0 0 0 0 0  Trouble concentrating 0 0 0 0 0  Moving slowly or fidgety/restless 0 0 0 0 0  Suicidal thoughts 0 0 0 0 0  PHQ-9 Score 0 0 0 0 0  Difficult doing work/chores  Not difficult at all Not difficult at all Not difficult at all Not difficult at all Not difficult at all    phq 9 is negative Reviewed today  Fall Risk: Fall Risk  06/30/2019 12/24/2018 09/25/2018 08/13/2018 06/25/2018  Falls in the past year? 0 0 0 0 0  Number falls in past yr: 0 - 0 0 0  Injury with Fall? 0 - 0 0 -  Follow up - - - - -    Functional Status Survey: Is the patient deaf or have difficulty hearing?: No Does the patient have difficulty seeing, even when wearing glasses/contacts?: No Does  the patient have difficulty concentrating, remembering, or making decisions?: No Does the patient have difficulty walking or climbing stairs?: No Does the patient have difficulty dressing or bathing?: No Does the patient have difficulty doing errands alone such as visiting a doctor's office or shopping?: No   Assessment & Plan:   1. Type 2 diabetes mellitus without complication, without long-term current use of insulin (HCC) Pt states well controlled on current meds - not checking CBG's I asked that she check her morning fasting blood sugars while on glipizide due to risk of hypoglycemia Need eye exam records Will need urine microalbumin and foot exam at next visit She did have some prior authorizations done in the past to qualify for her medications I did refill everything but we may need to do PA - linagliptin (TRADJENTA) 5 MG TABS tablet; Take 1 tablet (5 mg total) by mouth daily.  Dispense: 90 tablet; Refill: 1 - CMP w GFR - Lipid Panel - A1C - atorvastatin (LIPITOR) 80 MG tablet; Take 1 tablet (80 mg total) by mouth daily.  Dispense: 90 tablet; Refill: 1 - ezetimibe (ZETIA) 10 MG tablet; Take 1 tablet (10 mg total) by mouth daily.  Dispense: 90 tablet; Refill: 1 - glipiZIDE (GLUCOTROL XL) 10 MG 24 hr tablet; Take 1 tablet (10 mg total) by mouth 2 (two) times daily.  Dispense: 180 tablet; Refill: 1 - pioglitazone (ACTOS) 15 MG tablet; Take 1 tablet (15 mg total) by mouth daily.   Dispense: 90 tablet; Refill: 1  2. Hyperlipidemia, unspecified hyperlipidemia type Compliant with meds, no SE, no myalgias, fatigue or jaundice Due for FLP and recheck CMP - CMP w GFR - Lipid Panel  3. Essential hypertension, benign Well controlled, stable - CMP w GFR - amLODipine (NORVASC) 5 MG tablet; Take 1 tablet (5 mg total) by mouth daily.  Dispense: 90 tablet; Refill: 1  4. Gastroesophageal reflux disease without esophagitis Well controlled on prilosec BID, I did encourage her to use Pepcid or Zantac to try and decrease her Prilosec dose even if she can get to daily dosing - omeprazole (PRILOSEC) 20 MG capsule; Take 1 capsule (20 mg total) by mouth 2 (two) times daily.  Dispense: 180 capsule; Refill: 2  5. Recurrent rhinosinusitis Patient endorses worsening chronic sinus discharge and congestion particularly in the left maxillary sinus mildly tender on exam -symptoms ongoing for more than 2 to 3 weeks, recurrent problem for the patient, deviated septum to the left with some obstruction and bilateral edema erythema and purulent discharge will treat with antibiotics encourage her to follow-up with ENT. 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 - amoxicillin-clavulanate (AUGMENTIN) 875-125 MG tablet; Take 1 tablet by mouth 2 (two) times daily for 7 days.  Dispense: 14 tablet; Refill: 0 - fluconazole (DIFLUCAN) 150 MG tablet; Take 1 tablet (150 mg total) by mouth once for 1 dose.  Dispense: 1 tablet; Refill: 0  6. Encounter for screening mammogram for malignant neoplasm of breast - MM 3D SCREEN BREAST BILATERAL; Future  7. Screening for colorectal cancer - Ambulatory referral to Gastroenterology  8. Morbid obesity (HCC) Working on healthy diet has some exercise limitations, encouraged to decrease calories or increase physical activity to  help lose some weight - CMP w GFR - Lipid Panel - A1C  9. Hemoglobin C trait (HCC) - CBC w/ Diff  10. Medication management - CBC w/ Diff - CMP w GFR - Lipid Panel - A1C   Return in about 6 months (around 12/28/2019) for Routine follow-up, Annual Physical.   Split visit - understands copay for routine f/up and labs with CPE  Delsa Grana, [name] 06/30/19 11:08 AM

## 2019-07-01 ENCOUNTER — Other Ambulatory Visit: Payer: Self-pay

## 2019-07-01 ENCOUNTER — Telehealth: Payer: Self-pay

## 2019-07-01 DIAGNOSIS — Z1211 Encounter for screening for malignant neoplasm of colon: Secondary | ICD-10-CM

## 2019-07-01 DIAGNOSIS — Z86010 Personal history of colonic polyps: Secondary | ICD-10-CM

## 2019-07-01 LAB — CBC WITH DIFFERENTIAL/PLATELET
Absolute Monocytes: 583 cells/uL (ref 200–950)
Basophils Absolute: 70 cells/uL (ref 0–200)
Basophils Relative: 0.8 %
Eosinophils Absolute: 139 cells/uL (ref 15–500)
Eosinophils Relative: 1.6 %
HCT: 40.7 % (ref 35.0–45.0)
Hemoglobin: 12.9 g/dL (ref 11.7–15.5)
Lymphs Abs: 1644 cells/uL (ref 850–3900)
MCH: 23.8 pg — ABNORMAL LOW (ref 27.0–33.0)
MCHC: 31.7 g/dL — ABNORMAL LOW (ref 32.0–36.0)
MCV: 75 fL — ABNORMAL LOW (ref 80.0–100.0)
MPV: 12.4 fL (ref 7.5–12.5)
Monocytes Relative: 6.7 %
Neutro Abs: 6264 cells/uL (ref 1500–7800)
Neutrophils Relative %: 72 %
Platelets: 247 10*3/uL (ref 140–400)
RBC: 5.43 10*6/uL — ABNORMAL HIGH (ref 3.80–5.10)
RDW: 16.1 % — ABNORMAL HIGH (ref 11.0–15.0)
Total Lymphocyte: 18.9 %
WBC: 8.7 10*3/uL (ref 3.8–10.8)

## 2019-07-01 LAB — COMPLETE METABOLIC PANEL WITH GFR
AG Ratio: 1.7 (calc) (ref 1.0–2.5)
ALT: 15 U/L (ref 6–29)
AST: 14 U/L (ref 10–35)
Albumin: 4.1 g/dL (ref 3.6–5.1)
Alkaline phosphatase (APISO): 82 U/L (ref 37–153)
BUN: 13 mg/dL (ref 7–25)
CO2: 28 mmol/L (ref 20–32)
Calcium: 9.4 mg/dL (ref 8.6–10.4)
Chloride: 102 mmol/L (ref 98–110)
Creat: 0.66 mg/dL (ref 0.50–1.05)
GFR, Est African American: 114 mL/min/{1.73_m2} (ref >=60)
GFR, Est Non African American: 98 mL/min/{1.73_m2} (ref >=60)
Globulin: 2.4 g/dL (calc) (ref 1.9–3.7)
Glucose, Bld: 165 mg/dL — ABNORMAL HIGH (ref 65–99)
Potassium: 4 mmol/L (ref 3.5–5.3)
Sodium: 138 mmol/L (ref 135–146)
Total Bilirubin: 1.3 mg/dL — ABNORMAL HIGH (ref 0.2–1.2)
Total Protein: 6.5 g/dL (ref 6.1–8.1)

## 2019-07-01 LAB — HEMOGLOBIN A1C
Hgb A1c MFr Bld: 7.9 % of total Hgb — ABNORMAL HIGH (ref <5.7)
Mean Plasma Glucose: 180 (calc)
eAG (mmol/L): 10 (calc)

## 2019-07-01 LAB — LIPID PANEL
Cholesterol: 128 mg/dL (ref <200)
HDL: 41 mg/dL — ABNORMAL LOW (ref >=50)
LDL Cholesterol (Calc): 70 mg/dL (calc)
Non-HDL Cholesterol (Calc): 87 mg/dL (calc) (ref <130)
Total CHOL/HDL Ratio: 3.1 (calc) (ref <5.0)
Triglycerides: 89 mg/dL (ref <150)

## 2019-07-01 NOTE — Telephone Encounter (Signed)
Gastroenterology Pre-Procedure Review  Request Date: January 5th, 2021 Requesting Physician: Dr. Allen Norris  PATIENT REVIEW QUESTIONS: The patient responded to the following health history questions as indicated:    1. Are you having any GI issues? no 2. Do you have a personal history of Polyps? yes (5 years ago) 3. Do you have a family history of Colon Cancer or Polyps? no 4. Diabetes Mellitus? yes (oral meds) 5. Joint replacements in the past 12 months?no 6. Major health problems in the past 3 months?no 7. Any artificial heart valves, MVP, or defibrillator?no    MEDICATIONS & ALLERGIES:    Patient reports the following regarding taking any anticoagulation/antiplatelet therapy:   Plavix, Coumadin, Eliquis, Xarelto, Lovenox, Pradaxa, Brilinta, or Effient? no Aspirin? Yes 81 mg daily  Patient confirms/reports the following medications:  Current Outpatient Medications  Medication Sig Dispense Refill  . albuterol (PROVENTIL HFA;VENTOLIN HFA) 108 (90 BASE) MCG/ACT inhaler Inhale 2 puffs into the lungs every 2 (two) hours as needed for wheezing or shortness of breath.    Mar[place] Kitchen amLODipine (NORVASC) 5 MG tablet Take 1 tablet (5 mg total) by mouth daily. 90 tablet 1  . amoxicillin-clavulanate (AUGMENTIN) 875-125 MG tablet Take 1 tablet by mouth 2 (two) times daily for 7 days. 14 tablet 0  . aspirin 81 MG tablet Take 81 mg by mouth daily.    Marland Kitchen atorvastatin (LIPITOR) 80 MG tablet Take 1 tablet (80 mg total) by mouth daily. 90 tablet 1  . cholecalciferol (VITAMIN D3) 25 MCG (1000 UT) tablet Take 1,000 Units by mouth daily.    . cyclobenzaprine (FLEXERIL) 5 MG tablet Take 1 tablet (5 mg total) by mouth every 8 (eight) hours as needed for muscle spasms. Do not drive for 8 hours after taking 21 tablet 0  . EPINEPHrine (EPIPEN 2-PAK) 0.3 mg/0.3 mL IJ SOAJ injection Inject 0.3 mLs (0.3 mg total) into the muscle once. For life-threatening emergency only 1 Device 1  . ezetimibe (ZETIA) 10 MG tablet Take 1 tablet  (10 mg total) by mouth daily. 90 tablet 1  . glipiZIDE (GLUCOTROL XL) 10 MG 24 hr tablet Take 1 tablet (10 mg total) by mouth 2 (two) times daily. 180 tablet 1  . linagliptin (TRADJENTA) 5 MG TABS tablet Take 1 tablet (5 mg total) by mouth daily. 90 tablet 1  . losartan (COZAAR) 50 MG tablet TAKE 1 TABLET BY MOUTH EVERY DAY 90 tablet 2  . omeprazole (PRILOSEC) 20 MG capsule Take 1 capsule (20 mg total) by mouth 2 (two) times daily. 180 capsule 2  . pioglitazone (ACTOS) 15 MG tablet Take 1 tablet (15 mg total) by mouth daily. 90 tablet 1   No current facility-administered medications for this visit.     Patient confirms/reports the following allergies:  Allergies  Allergen Reactions  . Metformin And Related Other (See Comments)    Rash, Thrush   . Other     walnuts    No orders of the defined types were placed in this encounter.   AUTHORIZATION INFORMATION Primary Insurance: 1D#: Group #:  Secondary Insurance: 1D#: Group #:  SCHEDULE INFORMATION: Date: Tuesday 08/05/19 Time: Location:[facility]

## 2019-07-31 ENCOUNTER — Other Ambulatory Visit: Payer: 59 | Attending: Gastroenterology

## 2019-08-04 ENCOUNTER — Telehealth: Payer: Self-pay

## 2019-08-04 ENCOUNTER — Other Ambulatory Visit: Payer: Self-pay

## 2019-08-04 DIAGNOSIS — Z1211 Encounter for screening for malignant neoplasm of colon: Secondary | ICD-10-CM

## 2019-08-04 DIAGNOSIS — Z86010 Personal history of colonic polyps: Secondary | ICD-10-CM

## 2019-08-04 NOTE — Telephone Encounter (Signed)
Patient has [employer] phone number for her colonoscopy. Please call patient and she will give it to you

## 2019-08-04 NOTE — Telephone Encounter (Signed)
Gastroenterology Pre-Procedure Review  Request Date: Tuesday 09/09/19 Requesting Physician: Dr. Servando Snare  PATIENT REVIEW QUESTIONS: The patient responded to the following health history questions as indicated:    1. Are you having any GI issues? no 2. Do you have a personal history of Polyps? yes (unsure of the year) 3. Do you have a family history of Colon Cancer or Polyps? no 4. Diabetes Mellitus? yes (oral meds) 5. Joint replacements in the past 12 months?no 6. Major health problems in the past 3 months?no 7. Any artificial heart valves, MVP, or defibrillator?no    MEDICATIONS & ALLERGIES:    Patient reports the following regarding taking any anticoagulation/antiplatelet therapy:   Plavix, Coumadin, Eliquis, Xarelto, Lovenox, Pradaxa, Brilinta, or Effient? no Aspirin? no  Patient confirms/reports the following medications:  Current Outpatient Medications  Medication Sig Dispense Refill  . albuterol (PROVENTIL HFA;VENTOLIN HFA) 108 (90 BASE) MCG/ACT inhaler Inhale 2 puffs into the lungs every 2 (two) hours as needed for wheezing or shortness of breath.    Marland Kitchen amLODipine (NORVASC) 5 MG tablet Take 1 tablet (5 mg total) by mouth daily. 90 tablet 1  . aspirin 81 MG tablet Take 81 mg by mouth daily.    Marland Kitchen atorvastatin (LIPITOR) 80 MG tablet Take 1 tablet (80 mg total) by mouth daily. 90 tablet 1  . cholecalciferol (VITAMIN D3) 25 MCG (1000 UT) tablet Take 1,000 Units by mouth daily.    . cyclobenzaprine (FLEXERIL) 5 MG tablet Take 1 tablet (5 mg total) by mouth every 8 (eight) hours as needed for muscle spasms. Do not drive for 8 hours after taking 21 tablet 0  . EPINEPHrine (EPIPEN 2-PAK) 0.3 mg/0.3 mL IJ SOAJ injection Inject 0.3 mLs (0.3 mg total) into the muscle once. For life-threatening emergency only 1 Device 1  . ezetimibe (ZETIA) 10 MG tablet Take 1 tablet (10 mg total) by mouth daily. 90 tablet 1  . glipiZIDE (GLUCOTROL XL) 10 MG 24 hr tablet Take 1 tablet (10 mg total) by mouth 2  (two) times daily. 180 tablet 1  . linagliptin (TRADJENTA) 5 MG TABS tablet Take 1 tablet (5 mg total) by mouth daily. 90 tablet 1  . losartan (COZAAR) 50 MG tablet TAKE 1 TABLET BY MOUTH EVERY DAY 90 tablet 2  . omeprazole (PRILOSEC) 20 MG capsule Take 1 capsule (20 mg total) by mouth 2 (two) times daily. 180 capsule 2  . pioglitazone (ACTOS) 15 MG tablet Take 1 tablet (15 mg total) by mouth daily. 90 tablet 1   No current facility-administered medications for this visit.    Patient confirms/reports the following allergies:  Allergies  Allergen Reactions  . Metformin And Related Other (See Comments)    Rash, Thrush   . Other     walnuts    No orders of the defined types were placed in this encounter.   AUTHORIZATION INFORMATION Primary Insurance: 1D#: Group #:  Secondary Insurance: 1D#: Group #:  SCHEDULE INFORMATION: Date: 09/09/19 Time: Location:[facility]

## 2019-08-04 NOTE — Telephone Encounter (Signed)
Pt left vm she has anew insurance and needs to r/s her procedure due to switching insurance and needing it to get approved first

## 2019-08-05 ENCOUNTER — Encounter: Admission: RE | Payer: Self-pay | Source: Home / Self Care

## 2019-08-05 ENCOUNTER — Ambulatory Visit: Admission: RE | Admit: 2019-08-05 | Payer: Self-pay | Source: Home / Self Care | Admitting: Gastroenterology

## 2019-08-05 SURGERY — COLONOSCOPY WITH PROPOFOL
Anesthesia: General

## 2019-08-12 ENCOUNTER — Other Ambulatory Visit: Payer: Self-pay

## 2019-08-12 ENCOUNTER — Encounter: Payer: Self-pay | Admitting: Family Medicine

## 2019-08-12 ENCOUNTER — Ambulatory Visit (INDEPENDENT_AMBULATORY_CARE_PROVIDER_SITE_OTHER): Payer: BC Managed Care – PPO | Admitting: Family Medicine

## 2019-08-12 ENCOUNTER — Telehealth: Payer: Self-pay

## 2019-08-12 VITALS — Temp 98.0°F | Ht 63.0 in | Wt 210.0 lb

## 2019-08-12 DIAGNOSIS — U071 COVID-19: Secondary | ICD-10-CM | POA: Diagnosis not present

## 2019-08-12 DIAGNOSIS — E1165 Type 2 diabetes mellitus with hyperglycemia: Secondary | ICD-10-CM | POA: Diagnosis not present

## 2019-08-12 MED ORDER — BENZONATATE 100 MG PO CAPS: 100.0000 mg | ORAL_CAPSULE | Freq: Two times a day (BID) | ORAL | 0 refills | Status: DC | PRN

## 2019-08-12 NOTE — Progress Notes (Signed)
Name: Elizabeth Scott   MRN: [id number]    DOB: 04-11-1961   Date:08/12/2019       Progress Note  Subjective  Chief Complaint  Chief Complaint  Patient presents with  . COVID-19    Tested positive on Thursday took vaccine on the 31st.   . Generalized Body Aches    Work at [employer] all tested positive after vaccine  . Shortness of Breath    with exertion  . Cough    Clear phlegm    I connected with  Abrianna Sidman Diedrich on 08/12/19 at 11:00 AM EST by telephone and verified that I am speaking with the correct person using two identifiers.   I discussed the limitations, risks, security and privacy concerns of performing an evaluation and management service by telephone and the availability of in person appointments. Staff also discussed with the patient that there may be a patient responsible charge related to this service. 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 She got tested on Dec 30 st and was negative, she received vaccine Moderma COVID-19 vaccine on the 31st, and had mild body aches three days later. She had another COVID-19 test on January 7 th, 2021 and was positive. She states at the time of test she was asymptomatic but two days later developed mild body aches and slight headache, the following day developed some SOB with activity and mild cough. She has been using a rescue inhaler and a nebulizer machine that helps with symptoms. Cough is only present with activity She does not have a history of asthma or COPD but had pneumonia in 2019 and uses a rescue inhaler since prn when allergy symptoms are present Denies diarrhea or vomiting, but has lack of appetite, she is drinking plenty of water. She denies lack of smell, and has mild decrease of taste   DMII: she has DM, last A1C was above goal about 6 weeks ago, but she  has been taking medications and has not noticed an increase in her glucose since she has been sick. It has been in the 150's range   Patient Active Problem List   Diagnosis Date Noted  . Uncontrolled type 2 diabetes mellitus with hyperglycemia, without long-term current use of insulin ([place]) 09/25/2018  . Anemia, iron deficiency 10/17/2017  . Breast cancer screening 09/08/2016  . Left shoulder pain 09/08/2016  . Encounter for medication monitoring 03/31/2016  . 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 [place] allergy 08/30/2015  . Preventative health care 08/30/2015  . Hypercholesterolemia 04/27/2015  . Essential hypertension, benign 04/27/2015  . Microcytosis 04/27/2015  . Morbid obesity ([place]) 04/27/2015  . Gastroesophageal reflux disease without esophagitis 01/08/2015    Social History   Tobacco Use  . Smoking status: Never Smoker  . Smokeless tobacco: Never Used  Substance Use Topics  . Alcohol use: No     Current Outpatient Medications:  .  albuterol (PROVENTIL HFA;VENTOLIN HFA) 108 (90 BASE) MCG/ACT inhaler, Inhale 2 puffs into the lungs every 2 (two) hours as needed for wheezing or shortness of breath., Disp: , Rfl:  .  amLODipine (NORVASC) 5 MG tablet, Take 1 tablet (5 mg total) by mouth daily., Disp: 90 tablet, Rfl: 1 .  aspirin 81 MG tablet, Take 81 mg by mouth daily., Disp: , Rfl:  .  atorvastatin (LIPITOR) 80 MG tablet,  Take 1 tablet (80 mg total) by mouth daily., Disp: 90 tablet, Rfl: 1 .  cholecalciferol (VITAMIN D3) 25 MCG (1000 UT) tablet, Take 1,000 Units by mouth daily., Disp: , Rfl:  .  EPINEPHrine (EPIPEN 2-PAK) 0.3 mg/0.3 mL IJ SOAJ injection, Inject 0.3 mLs (0.3 mg total) into the muscle once. For life-threatening emergency only, Disp: 1 Device, Rfl: 1 .  ezetimibe (ZETIA) 10 MG tablet, Take 1 tablet (10 mg total) by mouth daily., Disp: 90 tablet, Rfl: 1 .  glipiZIDE (GLUCOTROL XL) 10 MG 24 hr tablet, Take 1 tablet (10 mg total) by mouth 2 (two) times daily.,  Disp: 180 tablet, Rfl: 1 .  linagliptin (TRADJENTA) 5 MG TABS tablet, Take 1 tablet (5 mg total) by mouth daily., Disp: 90 tablet, Rfl: 1 .  losartan (COZAAR) 50 MG tablet, TAKE 1 TABLET BY MOUTH EVERY DAY, Disp: 90 tablet, Rfl: 2 .  omeprazole (PRILOSEC) 20 MG capsule, Take 1 capsule (20 mg total) by mouth 2 (two) times daily., Disp: 180 capsule, Rfl: 2 .  pioglitazone (ACTOS) 15 MG tablet, Take 1 tablet (15 mg total) by mouth daily., Disp: 90 tablet, Rfl: 1 .  benzonatate (TESSALON) 100 MG capsule, Take 1-2 capsules (100-200 mg total) by mouth 2 (two) times daily as needed., Disp: 40 capsule, Rfl: 0  Allergies  Allergen Reactions  . Metformin And Related Other (See Comments)    Rash, Thrush   . Other     walnuts    I personally reviewed active problem list, medication list, allergies, family history, social history with the patient/caregiver today.  ROS  Ten systems reviewed and is negative except as mentioned in HPI   Objective  Virtual encounter, vitals obtained at home  Vitals:   08/12/19 1051  Temp: 98 F (36.7 C)    Body mass index is 37.2 kg/m.  Nursing Note and Vital Signs reviewed.  Physical Exam  Awake, alert and oriented  Assessment & Plan  1. COVID-19 virus infection  - benzonatate (TESSALON) 100 MG capsule; Take 1-2 capsules (100-200 mg total) by mouth 2 (two) times daily as needed.  Dispense: 40 capsule; Refill: 0 - MYCHART COVID-19 HOME MONITORING PROGRAM - Temperature monitoring; Future  She will get her sister pulse oximeter and advised to go to [facility] and or call 911 if chest pain that does not resolve with inhaler or pulse ox starts to stay below 92 % consistently . We will give her tessalon Perles may take mucinex otc   discussed  2. Type 2 diabetes mellitus with hyperglycemia, without long-term current use of insulin (HCC)  Monitor glucose   -Red flags and when to present for emergency care or RTC including fever >101.29F, chest pain,  shortness of breath, new/worsening/un-resolving symptoms, reviewed with patient at time of visit. Follow up and care instructions discussed and provided in AVS. - I discussed the assessment and treatment plan with the patient. The patient was provided an opportunity to ask questions and all were answered. The patient agreed with the plan and demonstrated an understanding of the instructions.  - The patient was advised to call back or seek an in-person evaluation if the symptoms worsen or if the condition fails to improve as anticipated.  I provided 25  minutes of non-face-to-face time during this encounter.  Ruel Favors, MD

## 2019-08-12 NOTE — Telephone Encounter (Signed)
Tried to call patient to rescheduled procedure scheduled on 09/09/2019. Could not leave a message because voicemail was not set up

## 2019-08-12 NOTE — Telephone Encounter (Signed)
Patient called back and she states she was diagnosed with COVID and is at home with COVID. Informed patient that we will have to cancel procedure do to COVID and due to stopping procedures here at [facility]. Informed patient that we would call her back to get the procedure rescheduled when we know our [place] scheduled with Dr. Servando Snare. Put patient on wait list.

## 2019-08-22 ENCOUNTER — Other Ambulatory Visit: Payer: Self-pay | Admitting: Family Medicine

## 2019-08-22 DIAGNOSIS — U071 COVID-19: Secondary | ICD-10-CM

## 2019-09-08 ENCOUNTER — Other Ambulatory Visit: Payer: Self-pay

## 2019-09-08 ENCOUNTER — Telehealth: Payer: Self-pay | Admitting: Gastroenterology

## 2019-09-08 DIAGNOSIS — Z1211 Encounter for screening for malignant neoplasm of colon: Secondary | ICD-10-CM

## 2019-09-08 NOTE — Telephone Encounter (Signed)
Patients call has been returned.  She has been scheduled for her colonoscopy on 10/10/19.  She staes that she previously tested positive for COVID in January.  I inquired if the test was done through [facility].  She said no.  I explained to her that we will still need to do COVID testing since it was not done through [facility].  If this test does come back positive, we will need to wait 21 days to reschedule, and at that point repeat testing will not be required. Patient advised of COVID test on 10/08/19. New instructions mailed to her.  Thanks, Marcelino Duster

## 2019-09-08 NOTE — Telephone Encounter (Signed)
Pt left vm she states she is supposed to have a procedure and was supposed to receive a call to reschedule since we were not doing them at [facility] and has not received a call

## 2019-09-09 ENCOUNTER — Ambulatory Visit: Admit: 2019-09-09 | Payer: BC Managed Care – PPO | Admitting: Gastroenterology

## 2019-09-09 SURGERY — COLONOSCOPY WITH PROPOFOL
Anesthesia: General

## 2019-09-29 ENCOUNTER — Telehealth: Payer: Self-pay

## 2019-09-29 NOTE — Telephone Encounter (Signed)
-----   Message from Denman George, CMA sent at 08/13/2019  3:54 PM EST ----- Regarding: COlonoscopy Scheduled for colonoscopy

## 2019-09-29 NOTE — Telephone Encounter (Signed)
Patient is scheduled on 10/10/2019 for a colonoscopy

## 2019-09-30 ENCOUNTER — Ambulatory Visit (INDEPENDENT_AMBULATORY_CARE_PROVIDER_SITE_OTHER): Payer: BC Managed Care – PPO | Admitting: Family Medicine

## 2019-09-30 ENCOUNTER — Other Ambulatory Visit: Payer: Self-pay

## 2019-09-30 ENCOUNTER — Encounter: Payer: Self-pay | Admitting: Family Medicine

## 2019-09-30 VITALS — Ht 63.0 in | Wt 210.0 lb

## 2019-09-30 DIAGNOSIS — I10 Essential (primary) hypertension: Secondary | ICD-10-CM

## 2019-09-30 DIAGNOSIS — E1165 Type 2 diabetes mellitus with hyperglycemia: Secondary | ICD-10-CM | POA: Diagnosis not present

## 2019-09-30 DIAGNOSIS — E785 Hyperlipidemia, unspecified: Secondary | ICD-10-CM

## 2019-09-30 DIAGNOSIS — R350 Frequency of micturition: Secondary | ICD-10-CM

## 2019-09-30 DIAGNOSIS — K219 Gastro-esophageal reflux disease without esophagitis: Secondary | ICD-10-CM

## 2019-09-30 DIAGNOSIS — R718 Other abnormality of red blood cells: Secondary | ICD-10-CM

## 2019-09-30 DIAGNOSIS — M79604 Pain in right leg: Secondary | ICD-10-CM

## 2019-09-30 NOTE — Progress Notes (Signed)
Patient ID: Elizabeth Scott, female    DOB: 11-10-1960, 59 y.o.   MRN: [id number]  PCP: Delsa Grana, [name]  Chief Complaint  Patient presents with  . Follow-up  . Hypertension  . Hyperlipidemia  . Gastroesophageal Reflux  . Diabetes    Subjective:   Elizabeth Scott is a 59 y.o. female, presents to clinic with CC of the following: DM, HTN, HLD and gerd  Diabetes Mellitus Type II: Currently managing with glipizide 10 mg XL and actos tradjenta 5 mg Pt notes good med compliance Pt has no SE from meds. Fasting CBGs typically run 110-130's occasionally higher to 140-150 She's had a few hypoglycemic episodes - 50  Denies: Polyuria, polydipsia, polyphagia, vision changes, or neuropathy    Recent pertinent labs: Lab Results  Component Value Date   HGBA1C 7.9 (H) 06/30/2019   HGBA1C 6.4 (H) 12/24/2018   HGBA1C 6.4 (H) 09/25/2018   Pt is up to date on DM foot exam - looks like she is due for eye exam - used to go to [facility] ACEI/ARB: Yes Statin: Yes  Hypertension: Managed with losartan 50 mg and amlodipine 5 mg.  Pt reports good med compliance and denies any SE. Her BP was elevated this morning 168/80 at 9am today, she takes meds in the morning, she explains it was a wrist cuff at home and she will recheck at work and let us know BP  BP Readings from Last 3 Encounters:  06/30/19 132/87  12/24/18 122/74  09/25/18 134/82  Pt denies CP, SOB, exertional sx, LE edema, palpitation, Ha's, visual disturbances, lightheadedness, hypotension, syncope.   Hyperlipidemia: Current Medication Regimen:  zetia and lipitor good compliance, no concerns or SE Last Lipids: Lab Results  Component Value Date   CHOL 128 06/30/2019   HDL 41 (L) 06/30/2019   LDLCALC 70 06/30/2019   TRIG 89 06/30/2019   CHOLHDL 3.1 06/30/2019  - Denies: Chest pain, shortness of breath, myalgias. - Documented aortic atherosclerosis? No - Risk factors for atherosclerosis: diabetes mellitus,  hypercholesterolemia and hypertension  GERD: - Current medication regimen: prilosec 20 mg takes BID and sx are well controlled  - Possible Triggers:  Doesn't know any triggers over than OJ - Endorses:   - Denies: abdominal pain, chest pain, cough, wheezing, weight loss, dysphagia, black stools, hematemesis, diarrhea, constipation, fever, history of PUD or history of GI bleeding   She has new complaint of right leg pain from knee down - Throbbing leg pain usually at night - only right leg knee down -  She's been having it for a little while - months off and on - no swelling, no knee injury, hx of bakers cyst, no claudication     Patient Active Problem List   Diagnosis Date Noted  . Uncontrolled type 2 diabetes mellitus with hyperglycemia, without long-term current use of insulin ([place]) 09/25/2018  . Anemia, iron deficiency 10/17/2017  . Breast cancer screening 09/08/2016  . Left shoulder pain 09/08/2016  . Encounter for medication monitoring 03/31/2016  . Hemoglobin C trait ([place]) 09/18/2015  . [place] allergy 08/30/2015  . Preventative health care 08/30/2015  . Hypercholesterolemia 04/27/2015  . Essential hypertension, benign 04/27/2015  . Microcytosis 04/27/2015  . Morbid obesity ([place]) 04/27/2015  . Gastroesophageal reflux disease without esophagitis 01/08/2015      Current Outpatient Medications:  .  albuterol (PROVENTIL HFA;VENTOLIN HFA) 108 (90 BASE) MCG/ACT inhaler, Inhale 2 puffs into the lungs every 2 (two) hours as needed for wheezing or shortness of  breath., Disp: , Rfl:  .  amLODipine (NORVASC) 5 MG tablet, Take 1 tablet (5 mg total) by mouth daily., Disp: 90 tablet, Rfl: 1 .  aspirin 81 MG tablet, Take 81 mg by mouth daily., Disp: , Rfl:  .  atorvastatin (LIPITOR) 80 MG tablet, Take 1 tablet (80 mg total) by mouth daily., Disp: 90 tablet, Rfl: 1 .  cholecalciferol (VITAMIN D3) 25 MCG (1000 UT) tablet, Take 1,000 Units by mouth daily., Disp: , Rfl:  .  EPINEPHrine (EPIPEN  2-PAK) 0.3 mg/0.3 mL IJ SOAJ injection, Inject 0.3 mLs (0.3 mg total) into the muscle once. For life-threatening emergency only, Disp: 1 Device, Rfl: 1 .  ezetimibe (ZETIA) 10 MG tablet, Take 1 tablet (10 mg total) by mouth daily., Disp: 90 tablet, Rfl: 1 .  glipiZIDE (GLUCOTROL XL) 10 MG 24 hr tablet, Take 1 tablet (10 mg total) by mouth 2 (two) times daily., Disp: 180 tablet, Rfl: 1 .  linagliptin (TRADJENTA) 5 MG TABS tablet, Take 1 tablet (5 mg total) by mouth daily., Disp: 90 tablet, Rfl: 1 .  losartan (COZAAR) 50 MG tablet, TAKE 1 TABLET BY MOUTH EVERY DAY, Disp: 90 tablet, Rfl: 2 .  omeprazole (PRILOSEC) 20 MG capsule, Take 1 capsule (20 mg total) by mouth 2 (two) times daily., Disp: 180 capsule, Rfl: 2 .  pioglitazone (ACTOS) 15 MG tablet, Take 1 tablet (15 mg total) by mouth daily., Disp: 90 tablet, Rfl: 1   Allergies  Allergen Reactions  . Metformin And Related Other (See Comments)    Rash, Thrush   . Other     walnuts     Family History  Problem Relation Age of Onset  . Diabetes Mother   . Heart disease Mother   . Hypertension Mother   . Stroke Mother   . Diabetes Father   . Hypertension Father   . Asthma Sister   . Diabetes Sister   . Hypertension Sister   . Lung disease Sister   . Hypertension Brother   . Heart disease Brother   . Stroke Brother   . Asthma Sister   . Diabetes Sister   . Hypertension Sister   . Congestive Heart Failure Maternal Grandmother   . Cancer Paternal Grandfather        stomach cancer  . COPD Neg Hx      Social History   Socioeconomic History  . Marital status: Married    Spouse name: Not on file  . Number of children: Not on file  . Years of education: Not on file  . Highest education level: Not on file  Occupational History  . Not on file  Tobacco Use  . Smoking status: Never Smoker  . Smokeless tobacco: Never Used  Substance and Sexual Activity  . Alcohol use: No  . Drug use: No  . Sexual activity: Yes  Other Topics  Concern  . Not on file  Social History Narrative  . Not on file   Social Determinants of Health   Financial Resource Strain:   . Difficulty of Paying Living Expenses: Not on file  Food Insecurity:   . Worried About Programme researcher, broadcasting/film/video in the Last Year: Not on file  . Ran Out of Food in the Last Year: Not on file  Transportation Needs:   . Lack of Transportation (Medical): Not on file  . Lack of Transportation (Non-Medical): Not on file  Physical Activity:   . Days of Exercise per Week: Not on file  .  Minutes of Exercise per Session: Not on file  Stress:   . Feeling of Stress : Not on file  Social Connections:   . Frequency of Communication with Friends and Family: Not on file  . Frequency of Social Gatherings with Friends and Family: Not on file  . Attends Religious Services: Not on file  . Active Member of Clubs or Organizations: Not on file  . Attends Banker Meetings: Not on file  . Marital Status: Not on file  Intimate Partner Violence:   . Fear of Current or Ex-Partner: Not on file  . Emotionally Abused: Not on file  . Physically Abused: Not on file  . Sexually Abused: Not on file    Chart Review Today: I personally reviewed active problem list, medication list, allergies, family history, social history, health maintenance, notes from last encounter, lab results, imaging with the patient/caregiver today.   Review of Systems 10 Systems reviewed and are negative for acute change except as noted in the HPI.     Objective:   Vitals:   09/30/19 0815  Weight: 210 lb (95.3 kg)  Height: 5\' 3"  (1.6 m)    Body mass index is 37.2 kg/m.  Physical Exam  Alert, well appearing obese female, normal phonation, NAD      Assessment & Plan:      ICD-10-CM   1. Type 2 diabetes mellitus with hyperglycemia, without long-term current use of insulin (HCC)  E11.65 COMPLETE METABOLIC PANEL WITH GFR    Urinalysis, Routine w reflex microscopic    Hemoglobin A1c    A1C was high, uncontrolled, DM unstable, now with some hypoglycemic episodes with glipizide 10 mg BID, skipping meals, discussed monitoring CBG in am, holding med when skipping meals or CBG low - info given on AVS through mychart.     2. Hyperlipidemia, unspecified hyperlipidemia type  E78.5 COMPLETE METABOLIC PANEL WITH GFR   stable, compliant with meds, no SE or concerns   3. Essential hypertension, benign  I10 COMPLETE METABOLIC PANEL WITH GFR   BP high with home wrist cuff - will recheck and notify of reading, has been well controlled SBP 120-130 usually   4. Gastroesophageal reflux disease without esophagitis  K21.9    well controlled with current meds   5. Microcytosis  R71.8 CBC with Differential/Platelet    Iron, TIBC and Ferritin Panel   check iron panel - leg cramps RLS, supplement iron   6. Urinary frequency  R35.0 Urinalysis, Routine w reflex microscopic   come into office to check, no pain just increased frequency, high CBG? med SE?   7. Right leg pain  M79.604    intermittent, throbbing, usually at night, RLS type sx, pt denies swelling, encouraged her to come into office for eval - denies claudication sx    Labs due now and then 3 month f/up      [place], [name] 09/30/19 10:02 AM

## 2019-09-30 NOTE — Patient Instructions (Addendum)
Come in for basic labs for your diabetes - want to check your A1C, a chemistry and urine  Definitely hold your glipizide if you are having low blood sugars (check in the am) or hold if you are not eating.  I would suggest only taking once daily with your FIRST MEAL - and do not take in the AM if you do not eat breakfast.  You will have drops in blood sugar and it can be dangerous.  For leg cramps you can supplement with some magnesium and iron and see if your symptoms improve.   If you have any worsening symptoms or any swelling, come in right away to get examined, or go to urgent care for evaluation.

## 2019-10-08 ENCOUNTER — Telehealth: Payer: Self-pay

## 2019-10-08 ENCOUNTER — Other Ambulatory Visit: Payer: BC Managed Care – PPO

## 2019-10-10 ENCOUNTER — Ambulatory Visit
Admission: RE | Admit: 2019-10-10 | Payer: BC Managed Care – PPO | Source: Home / Self Care | Admitting: Gastroenterology

## 2019-10-10 ENCOUNTER — Encounter: Admission: RE | Payer: Self-pay | Source: Home / Self Care

## 2019-10-10 SURGERY — COLONOSCOPY WITH PROPOFOL
Anesthesia: General

## 2019-10-14 NOTE — Telephone Encounter (Signed)
Patient contacted the office on 3/10 to request rescheduling her colonoscopy.  I explained to her that I would need to check with the office manager to see if we could allow reschedule due to she has had 4 cancellations.  I checked with our office manager to see if we could reschedule and due to the number of cancellations we will not be able to reschedule.  I've sent a secure message to Danelle Berry [name] to make her aware.  Thanks,  [place], [place]

## 2019-11-12 ENCOUNTER — Other Ambulatory Visit: Payer: Self-pay | Admitting: Family Medicine

## 2019-11-12 DIAGNOSIS — E119 Type 2 diabetes mellitus without complications: Secondary | ICD-10-CM

## 2019-11-21 ENCOUNTER — Other Ambulatory Visit: Payer: Self-pay

## 2019-11-21 ENCOUNTER — Telehealth: Payer: Self-pay

## 2019-11-21 MED ORDER — SITAGLIPTIN PHOSPHATE 25 MG PO TABS: 25.0000 mg | ORAL_TABLET | Freq: Every day | ORAL | 3 refills | Status: DC

## 2019-11-21 NOTE — Telephone Encounter (Signed)
Pt notified, insurance will no longer cover tradjenta, new med sent for [place]

## 2019-12-22 ENCOUNTER — Other Ambulatory Visit: Payer: Self-pay | Admitting: Family Medicine

## 2019-12-22 DIAGNOSIS — I10 Essential (primary) hypertension: Secondary | ICD-10-CM

## 2019-12-30 ENCOUNTER — Other Ambulatory Visit: Payer: Self-pay

## 2019-12-30 ENCOUNTER — Encounter: Payer: Self-pay | Admitting: Family Medicine

## 2019-12-30 ENCOUNTER — Ambulatory Visit (INDEPENDENT_AMBULATORY_CARE_PROVIDER_SITE_OTHER): Payer: BC Managed Care – PPO | Admitting: Family Medicine

## 2019-12-30 VITALS — BP 124/82 | HR 91 | Temp 97.9°F | Resp 14 | Ht 63.0 in | Wt 222.6 lb

## 2019-12-30 DIAGNOSIS — Z1211 Encounter for screening for malignant neoplasm of colon: Secondary | ICD-10-CM

## 2019-12-30 DIAGNOSIS — R350 Frequency of micturition: Secondary | ICD-10-CM

## 2019-12-30 DIAGNOSIS — E785 Hyperlipidemia, unspecified: Secondary | ICD-10-CM

## 2019-12-30 DIAGNOSIS — E119 Type 2 diabetes mellitus without complications: Secondary | ICD-10-CM

## 2019-12-30 DIAGNOSIS — Z0000 Encounter for general adult medical examination without abnormal findings: Secondary | ICD-10-CM

## 2019-12-30 DIAGNOSIS — K219 Gastro-esophageal reflux disease without esophagitis: Secondary | ICD-10-CM | POA: Diagnosis not present

## 2019-12-30 DIAGNOSIS — Z6839 Body mass index (BMI) 39.0-39.9, adult: Secondary | ICD-10-CM

## 2019-12-30 DIAGNOSIS — I10 Essential (primary) hypertension: Secondary | ICD-10-CM

## 2019-12-30 DIAGNOSIS — R718 Other abnormality of red blood cells: Secondary | ICD-10-CM

## 2019-12-30 DIAGNOSIS — M25562 Pain in left knee: Secondary | ICD-10-CM

## 2019-12-30 MED ORDER — EZETIMIBE 10 MG PO TABS: 10.0000 mg | ORAL_TABLET | Freq: Every day | ORAL | 3 refills | Status: DC

## 2019-12-30 MED ORDER — LOSARTAN POTASSIUM 50 MG PO TABS: 50.0000 mg | ORAL_TABLET | Freq: Every day | ORAL | 3 refills | Status: DC

## 2019-12-30 MED ORDER — MELOXICAM 15 MG PO TABS: 15.0000 mg | ORAL_TABLET | Freq: Every day | ORAL | 0 refills | Status: DC

## 2019-12-30 MED ORDER — OMEPRAZOLE 20 MG PO CPDR: 20.0000 mg | DELAYED_RELEASE_CAPSULE | Freq: Two times a day (BID) | ORAL | 2 refills | Status: DC

## 2019-12-30 MED ORDER — EPINEPHRINE 0.3 MG/0.3ML IJ SOAJ: 0.3000 mg | Freq: Once | INTRAMUSCULAR | 0 refills | Status: AC

## 2019-12-30 MED ORDER — ATORVASTATIN CALCIUM 80 MG PO TABS: 80.0000 mg | ORAL_TABLET | Freq: Every day | ORAL | 3 refills | Status: DC

## 2019-12-30 MED ORDER — GLIPIZIDE ER 10 MG PO TB24: 10.0000 mg | ORAL_TABLET | Freq: Two times a day (BID) | ORAL | 3 refills | Status: DC

## 2019-12-30 NOTE — Patient Instructions (Addendum)
We will call you with blood work and arrange a follow up if we need to discuss diabetes or adjust medicine.  Please call [facility] to schedule mammogram You will hear from GI and ortho   Preventive Care 84-58 Years Old, Female Preventive care refers to visits with your health care provider and lifestyle choices that can promote health and wellness. This includes:  A yearly physical exam. This may also be called an annual well check.  Regular dental visits and eye exams.  Immunizations.  Screening for certain conditions.  Healthy lifestyle choices, such as eating a healthy diet, getting regular exercise, not using drugs or products that contain nicotine and tobacco, and limiting alcohol use. What can I expect for my preventive care visit? Physical exam Your health care provider will check your:  Height and weight. This may be used to calculate body mass index (BMI), which tells if you are at a healthy weight.  Heart rate and blood pressure.  Skin for abnormal spots. Counseling Your health care provider may ask you questions about your:  Alcohol, tobacco, and drug use.  Emotional well-being.  Home and relationship well-being.  Sexual activity.  Eating habits.  Work and work Statistician.  Method of birth control.  Menstrual cycle.  Pregnancy history. What immunizations do I need?  Influenza (flu) vaccine  This is recommended every year. Tetanus, diphtheria, and pertussis (Tdap) vaccine  You may need a Td booster every 10 years. Varicella (chickenpox) vaccine  You may need this if you have not been vaccinated. Zoster (shingles) vaccine  You may need this after age 20. Measles, mumps, and rubella (MMR) vaccine  You may need at least one dose of MMR if you were born in 1957 or later. You may also need a second dose. Pneumococcal conjugate (PCV13) vaccine  You may need this if you have certain conditions and were not previously vaccinated. Pneumococcal  polysaccharide (PPSV23) vaccine  You may need one or two doses if you smoke cigarettes or if you have certain conditions. Meningococcal conjugate (MenACWY) vaccine  You may need this if you have certain conditions. Hepatitis A vaccine  You may need this if you have certain conditions or if you travel or work in places where you may be exposed to hepatitis A. Hepatitis B vaccine  You may need this if you have certain conditions or if you travel or work in places where you may be exposed to hepatitis B. Haemophilus influenzae type b (Hib) vaccine  You may need this if you have certain conditions. Human papillomavirus (HPV) vaccine  If recommended by your health care provider, you may need three doses over 6 months. You may receive vaccines as individual doses or as more than one vaccine together in one shot (combination vaccines). Talk with your health care provider about the risks and benefits of combination vaccines. What tests do I need? Blood tests  Lipid and cholesterol levels. These may be checked every 5 years, or more frequently if you are over 42 years old.  Hepatitis C test.  Hepatitis B test. Screening  Lung cancer screening. You may have this screening every year starting at age 73 if you have a 30-pack-year history of smoking and currently smoke or have quit within the past 15 years.  Colorectal cancer screening. All adults should have this screening starting at age 71 and continuing until age 56. Your health care provider may recommend screening at age 33 if you are at increased risk. You will have tests every  1-10 years, depending on your results and the type of screening test.  Diabetes screening. This is done by checking your blood sugar (glucose) after you have not eaten for a while (fasting). You may have this done every 1-3 years.  Mammogram. This may be done every 1-2 years. Talk with your health care provider about when you should start having regular  mammograms. This may depend on whether you have a family history of breast cancer.  BRCA-related cancer screening. This may be done if you have a family history of breast, ovarian, tubal, or peritoneal cancers.  Pelvic exam and Pap test. This may be done every 3 years starting at age 86. Starting at age 66, this may be done every 5 years if you have a Pap test in combination with an HPV test. Other tests  Sexually transmitted disease (STD) testing.  Bone density scan. This is done to screen for osteoporosis. You may have this scan if you are at high risk for osteoporosis. Follow these instructions at home: Eating and drinking  Eat a diet that includes fresh fruits and vegetables, whole grains, lean protein, and low-fat dairy.  Take vitamin and mineral supplements as recommended by your health care provider.  Do not drink alcohol if: ? Your health care provider tells you not to drink. ? You are pregnant, may be pregnant, or are planning to become pregnant.  If you drink alcohol: ? Limit how much you have to 0-1 drink a day. ? Be aware of how much alcohol is in your drink. In the [place] one drink equals one 12 oz bottle of beer (355 mL), one 5 oz glass of wine (148 mL), or one 1 oz glass of hard liquor (44 mL). Lifestyle  Take daily care of your teeth and gums.  Stay active. Exercise for at least 30 minutes on 5 or more days each week.  Do not use any products that contain nicotine or tobacco, such as cigarettes, e-cigarettes, and chewing tobacco. If you need help quitting, ask your health care provider.  If you are sexually active, practice safe sex. Use a condom or other form of birth control (contraception) in order to prevent pregnancy and STIs (sexually transmitted infections).  If told by your health care provider, take low-dose aspirin daily starting at age 47. What's next?  Visit your health care provider once a year for a well check visit.  Ask your health care provider  how often you should have your eyes and teeth checked.  Stay up to date on all vaccines. This information is not intended to replace advice given to you by your health care provider. Make sure you discuss any questions you have with your health care provider. Document Revised: 03/28/2018 Document Reviewed: 03/28/2018 [employer]vier Patient Education  2020 [employer].

## 2019-12-30 NOTE — Progress Notes (Signed)
Patient: Elizabeth Scott, Female    DOB: Oct 02, 1960, 59 y.o.   MRN: [id number] Delsa Grana, [name] Visit Date: 12/30/2019  Today's Provider: Delsa Grana, [name]   Chief Complaint  Patient presents with  . Annual Exam  . Knee Pain    left onset 1 week, with swelling, stiffnes and sore   Subjective:   Annual physical exam:  Elizabeth Scott is a 59 y.o. female who presents today for complete physical exam:  Exercise/Activity:  Left knee pain flared up last week, usually no pain and she has a physical job- walks a lot with food service Diet/nutrition:  Tries to avoid fried foods and she avoids sweets - cause shes a diabetic, she is not as hungry in the evenings Not a big breakfast eater, sometimes a big lunch  Sleep:  Never needed a lot of sleep, sometimes 5 hours is enough sleep  DM - on glipizide 10 mg XL and actos and tradjenta 5 mg, CBGs run about 120's, has not switched from tradjenta to [place] (insurance would not cover) Lab Results  Component Value Date   HGBA1C 7.9 (H) 06/30/2019   HLd- compliant with atorvastatin 80 mg daily, no myalgias or concerns.  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  Advised pt of separate visit billing/coding  USPSTF grade A and B recommendations - reviewed and addressed today  Depression:  Phq 9 completed today by patient, was reviewed by me with patient in the room PHQ score is neg, pt feels good PHQ 2/9 Scores 12/30/2019 09/30/2019 08/12/2019 06/30/2019  PHQ - 2 Score 0 0 0 0  PHQ- 9 Score 1 0 0 0   Depression screen [facility] 2/9 12/30/2019  09/30/2019 08/12/2019 06/30/2019 12/24/2018  Decreased Interest 0 0 0 0 0  Down, Depressed, Hopeless 0 0 0 0 0  PHQ - 2 Score 0 0 0 0 0  Altered sleeping 1 0 0 0 0  Tired, decreased energy 0 0 0 0 0  Change in appetite 0 0 0 0 0  Feeling bad or failure about yourself  0 0 0 0 0  Trouble concentrating 0 0 0 0 0  Moving slowly or fidgety/restless 0 0 0 0 0  Suicidal thoughts 0 0 0 0 0  PHQ-9 Score 1 0 0 0 0  Difficult doing work/chores Not difficult at all Not difficult at all Not difficult at all Not difficult at all Not difficult at all  Some recent data might be hidden    Alcohol screening:   [facility] Visit from 12/30/2019 in [facility]  AUDIT-C Score  0      Immunizations and Health Maintenance: Health Maintenance  Topic Date Due  . MAMMOGRAM  09/10/2012  . OPHTHALMOLOGY EXAM  10/29/2017  . COLONOSCOPY  02/23/2019  . FOOT EXAM  12/24/2019  . HEMOGLOBIN A1C  12/28/2019  . INFLUENZA VACCINE  02/29/2020  . PAP SMEAR-Modifier  10/17/2020  . TETANUS/TDAP  06/11/2021  . PNEUMOCOCCAL POLYSACCHARIDE VACCINE AGE 24-64 HIGH RISK  Completed  . COVID-19 Vaccine  Completed  .  Hepatitis C Screening  Completed  . HIV Screening  Completed     Hep C Screening: done in the past  STD testing and prevention (HIV/chl/gon/syphilis):  see above, no additional testing desired by pt today  Intimate partner violence:  Feels safe   Sexual History/Pain during Intercourse: Married - none  Menstrual History/LMP/Abnormal Bleeding:  none No LMP recorded. Patient has had a hysterectomy.  Last PAP 2019 with cells found for evaluation, HPV neg, hysterectomy, suspect partial, pt does not wish for pelvic today, due for repeat pap due 2022  Incontinence Symptoms:  none   Some urinary frequency - feels like shes at the start of UTI, no dysuria no hematuria  Breast cancer:  Last Mammogram: *see HM list above- due - previously ordered - pt to call [place] to schedule BRCA gene  screening:   None in the past  Cervical cancer screening: hysterectomy  Pt no family hx of cancers - breast, ovarian, uterine, colon:     Osteoporosis:   Discussion on osteoporosis per age, including high calcium and vitamin D supplementation, weight bearing exercises Pt is supplementing with Vit D, no calcium No past Bone scan/dexa - in chart - she believes she did a bone scan years and years ago Roughly experienced menopause at age 59   Skin cancer:  Hx of skin CA -  NO Discussed atypical lesions   Colorectal cancer:   Colonoscopy is overdue - f/up with Dr. Allen Norris - per insurance needs f/up at [facility] (5 year f/up)- previously referred, but some trouble with scheduling due to her work  Discussed concerning signs and sx of CRC, pt denies melena, hematochezia, change in bowel pattern   Lung cancer:   Low Dose CT Chest recommended if Age 66-80 years, 30 pack-year currently smoking OR have quit w/in 15years. Patient does not qualify.    Social History   Tobacco Use  . Smoking status: Never Smoker  . Smokeless tobacco: Never Used  Substance Use Topics  . Alcohol use: No  . Drug use: No       Office Visit from 12/30/2019 in [facility]  AUDIT-C Score  0      Family History  Problem Relation Age of Onset  . Diabetes Mother   . Heart disease Mother   . Hypertension Mother   . Stroke Mother   . Diabetes Father   . Hypertension Father   . Asthma Sister   . Diabetes Sister   . Hypertension Sister   . Lung disease Sister   . Hypertension Brother   . Heart disease Brother   . Stroke Brother   . Asthma Sister   . Diabetes Sister   . Hypertension Sister   . Congestive Heart Failure Maternal Grandmother   . Cancer Paternal Grandfather        stomach cancer  . COPD Neg Hx      Blood pressure/Hypertension: BP Readings from Last 3 Encounters:  12/30/19 124/82  06/30/19 132/87  12/24/18 122/74    Weight/Obesity: Wt Readings from Last 3 Encounters:    12/30/19 222 lb 9.6 oz (101 kg)  09/30/19 210 lb (95.3 kg)  08/12/19 210 lb (95.3 kg)   BMI Readings from Last 3 Encounters:  12/30/19 39.43 kg/m  09/30/19 37.20 kg/m  08/12/19 37.20 kg/m     Lipids:  Lab Results  Component Value Date   CHOL 128 06/30/2019   CHOL 118 09/25/2018   CHOL 160 06/25/2018   Lab Results  Component  Value Date   HDL 41 (L) 06/30/2019   HDL 45 (L) 09/25/2018   HDL 43 (L) 06/25/2018   Lab Results  Component Value Date   LDLCALC 70 06/30/2019   LDLCALC 59 09/25/2018   LDLCALC 97 06/25/2018   Lab Results  Component Value Date   TRIG 89 06/30/2019   TRIG 62 09/25/2018   TRIG 103 06/25/2018   Lab Results  Component Value Date   CHOLHDL 3.1 06/30/2019   CHOLHDL 2.6 09/25/2018   CHOLHDL 3.7 06/25/2018   No results found for: LDLDIRECT Based on the results of lipid panel his/her cardiovascular risk factor ( using [facility] )  in the next 10 years is: The ASCVD Risk score Mikey Bussing DC Jr., et al., 2013) failed to calculate for the following reasons:   The valid total cholesterol range is 130 to 320 mg/dL Glucose:  Glucose, Bld  Date Value Ref Range Status  06/30/2019 165 (H) 65 - 99 mg/dL Final    Comment:    .            Fasting reference interval . For someone without known diabetes, a glucose value >125 mg/dL indicates that they may have diabetes and this should be confirmed with a follow-up test. .   12/24/2018 155 (H) 65 - 99 mg/dL Final    Comment:    .            Fasting reference interval . For someone without known diabetes, a glucose value >125 mg/dL indicates that they may have diabetes and this should be confirmed with a follow-up test. .   07/08/2018 192 (H) 65 - 139 mg/dL Final    Comment:    .        Non-fasting reference interval .    Hypertension: BP Readings from Last 3 Encounters:  12/30/19 124/82  06/30/19 132/87  12/24/18 122/74   Obesity: Wt Readings from Last 3 Encounters:  12/30/19 222 lb  9.6 oz (101 kg)  09/30/19 210 lb (95.3 kg)  08/12/19 210 lb (95.3 kg)   BMI Readings from Last 3 Encounters:  12/30/19 39.43 kg/m  09/30/19 37.20 kg/m  08/12/19 37.20 kg/m      Advanced Care Planning:  A voluntary discussion about advance care planning including the explanation and discussion of advance directives.   Discussed health care proxy and Living will, and the patient was able to identify a health care proxy as husband- Merlyn Bollen Patient does not have a living will at present time.   Social History      She        Social History   Socioeconomic History  . Marital status: Married    Spouse name: Not on file  . Number of children: Not on file  . Years of education: Not on file  . Highest education level: Not on file  Occupational History  . Not on file  Tobacco Use  . Smoking status: Never Smoker  . Smokeless tobacco: Never Used  Substance and Sexual Activity  . Alcohol use: No  . Drug use: No  . Sexual activity: Yes  Other Topics Concern  . Not on file  Social History Narrative  . Not on file   Social Determinants of Health   Financial Resource Strain:   . Difficulty of Paying Living Expenses:   Food Insecurity:   . Worried About Charity fundraiser in the Last Year:   . [place] in the Last Year:  Transportation Needs:   . Film/video editor (Medical):   Marland Kitchen Lack of Transportation (Non-Medical):   Physical Activity:   . Days of Exercise per Week:   . Minutes of Exercise per Session:   Stress:   . Feeling of Stress :   Social Connections:   . Frequency of Communication with Friends and Family:   . Frequency of Social Gatherings with Friends and Family:   . Attends Religious Services:   . Active Member of Clubs or Organizations:   . Attends Archivist Meetings:   Marland Kitchen Marital Status:     Family History        Family History  Problem Relation Age of Onset  . Diabetes Mother   . Heart disease Mother   .  Hypertension Mother   . Stroke Mother   . Diabetes Father   . Hypertension Father   . Asthma Sister   . Diabetes Sister   . Hypertension Sister   . Lung disease Sister   . Hypertension Brother   . Heart disease Brother   . Stroke Brother   . Asthma Sister   . Diabetes Sister   . Hypertension Sister   . Congestive Heart Failure Maternal Grandmother   . Cancer Paternal Grandfather        stomach cancer  . COPD Neg Hx     Patient Active Problem List   Diagnosis Date Noted  . Uncontrolled type 2 diabetes mellitus with hyperglycemia, without long-term current use of insulin ([place]) 09/25/2018  . Anemia, iron deficiency 10/17/2017  . Breast cancer screening 09/08/2016  . Left shoulder pain 09/08/2016  . Encounter for medication monitoring 03/31/2016  . 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 [place] allergy 08/30/2015  . Preventative health care 08/30/2015  . Hypercholesterolemia 04/27/2015  . Essential hypertension, benign 04/27/2015  . Microcytosis 04/27/2015  . Morbid obesity ([place]) 04/27/2015  . Gastroesophageal reflux disease without esophagitis 01/08/2015    Past Surgical History:  Procedure Laterality Date  . ABDOMINAL HYSTERECTOMY  2003   still has ovaries-for menorrhagia  . CESAREAN SECTION    . COLONOSCOPY  02/22/14   repeat 5 years  . ESOPHAGOGASTRODUODENOSCOPY  02/22/14  . TUBAL LIGATION       Current Outpatient Medications:  .  albuterol (PROVENTIL HFA;VENTOLIN HFA) 108 (90 BASE) MCG/ACT inhaler, Inhale 2 puffs into the lungs every 2 (two) hours as needed for wheezing or shortness of breath., Disp: , Rfl:  .  amLODipine (NORVASC) 5 MG tablet, TAKE 1 TABLET BY MOUTH EVERY DAY, Disp: 90 tablet, Rfl: 3 .  aspirin 81 MG tablet, Take 81 mg by mouth daily., Disp: , Rfl:  .  atorvastatin (LIPITOR) 80 MG tablet, Take 1 tablet (80 mg total) by mouth daily., Disp: 90 tablet, Rfl: 1 .  cholecalciferol (VITAMIN D3) 25 MCG (1000 UT) tablet, Take 1,000 Units by mouth daily.,  Disp: , Rfl:  .  EPINEPHrine (EPIPEN 2-PAK) 0.3 mg/0.3 mL IJ SOAJ injection, Inject 0.3 mLs (0.3 mg total) into the muscle once. For life-threatening emergency only, Disp: 1 Device, Rfl: 1 .  ezetimibe (ZETIA) 10 MG tablet, Take 1 tablet (10 mg total) by mouth daily., Disp: 90 tablet, Rfl: 1 .  glipiZIDE (GLUCOTROL XL) 10 MG 24 hr tablet, Take 1 tablet (10 mg total) by mouth 2 (two) times daily., Disp: 180 tablet, Rfl: 1 .  losartan (COZAAR) 50 MG tablet, TAKE 1 TABLET BY MOUTH EVERY DAY, Disp: 90 tablet, Rfl: 2 .  omeprazole (PRILOSEC)  20 MG capsule, Take 1 capsule (20 mg total) by mouth 2 (two) times daily., Disp: 180 capsule, Rfl: 2 .  pioglitazone (ACTOS) 15 MG tablet, Take 1 tablet (15 mg total) by mouth daily., Disp: 90 tablet, Rfl: 1 .  TRADJENTA 5 MG TABS tablet, TAKE 1 TABLET BY MOUTH EVERY DAY, Disp: 90 tablet, Rfl: 1 .  sitaGLIPtin (JANUVIA) 25 MG tablet, Take 1 tablet (25 mg total) by mouth daily. (Patient not taking: Reported on 12/30/2019), Disp: 90 tablet, Rfl: 3  Allergies  Allergen Reactions  . Metformin And Related Other (See Comments)    Rash, Thrush   . Other     walnuts    Patient Care Team: Delsa Grana, [name] as PCP - General (Family Medicine) Margaretha Sheffield, MD (Otolaryngology)  Review of Systems  Constitutional: Negative.  Negative for activity change, appetite change, fatigue and unexpected weight change.  HENT: Negative.   Eyes: Negative.   Respiratory: Negative.  Negative for shortness of breath.   Cardiovascular: Negative.  Negative for chest pain, palpitations and leg swelling.  Gastrointestinal: Negative.  Negative for abdominal pain and blood in stool.  Endocrine: Negative.   Genitourinary: Negative.   Musculoskeletal: Positive for joint swelling. Negative for arthralgias, gait problem and myalgias.  Skin: Negative.  Negative for color change, pallor and rash.  Allergic/Immunologic: Negative.   Neurological: Negative.  Negative for syncope and weakness.    Hematological: Negative.   Psychiatric/Behavioral: Negative.  Negative for confusion, dysphoric mood, self-injury and suicidal ideas. The patient is not nervous/anxious.      I personally reviewed active problem list, medication list, allergies, family history, social history, health maintenance, notes from last encounter, lab results, imaging with the patient/caregiver today.        Objective:   Vitals:  Vitals:   12/30/19 0915  BP: 124/82  Pulse: 91  Resp: 14  Temp: 97.9 F (36.6 C)  SpO2: 99%  Weight: 222 lb 9.6 oz (101 kg)  Height: _0  (1.6 m)    Body mass index is 39.43 kg/m.  Physical Exam Vitals and nursing note reviewed.  Constitutional:      General: She is not in acute distress.    Appearance: Normal appearance. She is well-developed. She is obese. She is not ill-appearing, toxic-appearing or diaphoretic.     Interventions: Face mask in place.  HENT:     Head: Normocephalic and atraumatic.     Right Ear: External ear normal.     Left Ear: External ear normal.  Eyes:     General: Lids are normal. No scleral icterus.       Right eye: No discharge.        Left eye: No discharge.     Conjunctiva/sclera: Conjunctivae normal.  Neck:     Trachea: Phonation normal. No tracheal deviation.  Cardiovascular:     Rate and Rhythm: Normal rate and regular rhythm.     Pulses: Normal pulses.          Radial pulses are 2+ on the right side and 2+ on the left side.       Posterior tibial pulses are 2+ on the right side and 2+ on the left side.     Heart sounds: Normal heart sounds. No murmur. No friction rub. No gallop.   Pulmonary:     Effort: Pulmonary effort is normal. No respiratory distress.     Breath sounds: Normal breath sounds. No stridor. No wheezing, rhonchi or rales.  Chest:  Chest wall: No tenderness.  Abdominal:     General: Bowel sounds are normal. There is no distension.     Palpations: Abdomen is soft.     Tenderness: There is no abdominal  tenderness. There is no guarding or rebound.  Musculoskeletal:        General: No deformity.     Cervical back: Normal range of motion and neck supple.     Left knee: Swelling present. No effusion, erythema, bony tenderness or crepitus. Decreased range of motion.     Right lower leg: No edema.     Left lower leg: No edema.     Comments: Left knee diffusely swollen, no focal ttp, good flexion, pain with full extension, antalgic gait, no instability  Lymphadenopathy:     Cervical: No cervical adenopathy.  Skin:    General: Skin is warm and dry.     Capillary Refill: Capillary refill takes less than 2 seconds.     Coloration: Skin is not jaundiced or pale.     Findings: No rash.  Neurological:     Mental Status: She is alert and oriented to person, place, and time.     Motor: No abnormal muscle tone.     Gait: Gait normal.  Psychiatric:        Speech: Speech normal.        Behavior: Behavior normal.     Diabetic Foot Exam - Simple   Simple Foot Form Diabetic Foot exam was performed with the following findings: Yes 12/30/2019  9:45 AM  Visual Inspection Sensation Testing Pulse Check Comments      Fall Risk: Fall Risk  12/30/2019 09/30/2019 08/12/2019 06/30/2019 12/24/2018  Falls in the past year? 0 0 0 0 0  Number falls in past yr: 0 0 0 0 -  Injury with Fall? 0 0 0 0 -  Follow up - - - - -    Functional Status Survey: Is the patient deaf or have difficulty hearing?: No Does the patient have difficulty seeing, even when wearing glasses/contacts?: No Does the patient have difficulty concentrating, remembering, or making decisions?: No Does the patient have difficulty walking or climbing stairs?: No Does the patient have difficulty dressing or bathing?: No Does the patient have difficulty doing errands alone such as visiting a doctor's office or shopping?: No   Assessment & Plan:    CPE completed today  . USPSTF grade A and B recommendations reviewed with patient;  age-appropriate recommendations, preventive care, screening tests, etc discussed and encouraged; healthy living encouraged; see AVS for patient education given to patient  . Discussed importance of 150 minutes of physical activity weekly, AHA exercise recommendations given to pt in AVS/handout  . Discussed importance of healthy diet:  eating lean meats and proteins, avoiding trans fats and saturated fats, avoid simple sugars and excessive carbs in diet, eat 6 servings of fruit/vegetables daily and drink plenty of water and avoid sweet beverages.    . Recommended pt to do annual eye exam and routine dental exams/cleanings  . Depression, alcohol, fall screening completed as documented above and per flowsheets  . Reviewed Health Maintenance: Health Maintenance  Topic Date Due  . MAMMOGRAM  09/10/2012  . OPHTHALMOLOGY EXAM  10/29/2017  . COLONOSCOPY  02/23/2019  . INFLUENZA VACCINE  02/29/2020  . HEMOGLOBIN A1C  06/30/2020  . PAP SMEAR-Modifier  10/17/2020  . FOOT EXAM  12/29/2020  . TETANUS/TDAP  06/11/2021  . PNEUMOCOCCAL POLYSACCHARIDE VACCINE AGE 22-64 HIGH RISK  Completed  .  COVID-19 Vaccine  Completed  . Hepatitis C Screening  Completed  . HIV Screening  Completed    . Immunizations: Immunization History  Administered Date(s) Administered  . Influenza,inj,Quad PF,6+ Mos 05/14/2019  . Influenza-Unspecified 04/01/2015  . Moderna SARS-COVID-2 Vaccination 07/31/2019, 08/28/2019  . Pneumococcal Polysaccharide-23 12/05/2011  . Tdap 06/12/2011     1. Adult general medical exam - CBC with Differential/Platelet - COMPLETE METABOLIC PANEL WITH GFR - Lipid panel - Hemoglobin A1c  2. Screen for colon cancer Pt to f/up with GI- difficulty with scheduling - Ambulatory referral to Gastroenterology  3. Type 2 diabetes mellitus without complication, without long-term current use of insulin ([place]) Uncontrolled with past OV, some med changes due to insurance coverage, though pt has not  changed meds yet, due for labs, foot exam done, pt to f/up with ophtho for DM eye exam - atorvastatin (LIPITOR) 80 MG tablet; Take 1 tablet (80 mg total) by mouth daily.  Dispense: 90 tablet; Refill: 3 - ezetimibe (ZETIA) 10 MG tablet; Take 1 tablet (10 mg total) by mouth daily.  Dispense: 90 tablet; Refill: 3 - losartan (COZAAR) 50 MG tablet; Take 1 tablet (50 mg total) by mouth daily.  Dispense: 90 tablet; Refill: 3 - glipiZIDE (GLUCOTROL XL) 10 MG 24 hr tablet; Take 1 tablet (10 mg total) by mouth 2 (two) times daily.  Dispense: 180 tablet; Refill: 3 - COMPLETE METABOLIC PANEL WITH GFR - Lipid panel - Hemoglobin A1c - Urinalysis, Routine w reflex microscopic - Microalbumin, urine  4. Gastroesophageal reflux disease without esophagitis Well controlled with BID continued use of prilosec - encouraged her to try pepcid and zantac daily and wean off PPI - omeprazole (PRILOSEC) 20 MG capsule; Take 1 capsule (20 mg total) by mouth 2 (two) times daily.  Dispense: 180 capsule; Refill: 2  5. Hyperlipidemia, unspecified hyperlipidemia type Compliant with statin and zetia, due for repeat labs, no myalgias/SE - ezetimibe (ZETIA) 10 MG tablet; Take 1 tablet (10 mg total) by mouth daily.  Dispense: 90 tablet; Refill: 3 - COMPLETE METABOLIC PANEL WITH GFR - Lipid panel  6. Essential hypertension, benign Stable, well controlled - losartan (COZAAR) 50 MG tablet; Take 1 tablet (50 mg total) by mouth daily.  Dispense: 90 tablet; Refill: 3 - COMPLETE METABOLIC PANEL WITH GFR  7. Microcytosis Hx of microcytosis w/o anemia, screen for iron deficiency.  No menses, no suspected GI blood loss, genetic? Absorption?  - CBC with Differential/Platelet - Iron, TIBC and Ferritin Panel  8. Urinary frequency Feels like "the start of a UTI" will check urine, tx if concerning for UTI - Urinalysis, Routine w reflex microscopic  9. Acute pain of left knee Acute left knee pain w/o injury, diffusely swollen.  Hx of  right knee pain which was similar- dx of OA and baker cyst - refer to ortho, defer imaging to specialist.  Encouraged tylenol, mobic, rest/compression sleeve, ice and elevation - meloxicam (MOBIC) 15 MG tablet; Take 1 tablet (15 mg total) by mouth daily.  Dispense: 30 tablet; Refill: 0 - Ambulatory referral to Orthopedics  10. Class 2 severe obesity with serious comorbidity and body mass index (BMI) of 39.0 to 39.9 in adult, unspecified obesity type ([place]) Some weight gain, discussed diet/lifestyle. Possibly look at GLP1 DM meds?  Nutritional referral?  Right now pt does not have a lot of time for additional referrals.          Delsa Grana, [name] 12/30/19 9:41 AM  [facility]

## 2019-12-31 LAB — COMPLETE METABOLIC PANEL WITH GFR
AG Ratio: 1.6 (calc) (ref 1.0–2.5)
ALT: 15 U/L (ref 6–29)
AST: 16 U/L (ref 10–35)
Albumin: 4 g/dL (ref 3.6–5.1)
Alkaline phosphatase (APISO): 73 U/L (ref 37–153)
BUN: 10 mg/dL (ref 7–25)
CO2: 31 mmol/L (ref 20–32)
Calcium: 9.2 mg/dL (ref 8.6–10.4)
Chloride: 103 mmol/L (ref 98–110)
Creat: 0.57 mg/dL (ref 0.50–1.05)
GFR, Est African American: 119 mL/min/{1.73_m2} (ref >=60)
GFR, Est Non African American: 103 mL/min/{1.73_m2} (ref >=60)
Globulin: 2.5 g/dL (calc) (ref 1.9–3.7)
Glucose, Bld: 127 mg/dL — ABNORMAL HIGH (ref 65–99)
Potassium: 4.1 mmol/L (ref 3.5–5.3)
Sodium: 141 mmol/L (ref 135–146)
Total Bilirubin: 0.7 mg/dL (ref 0.2–1.2)
Total Protein: 6.5 g/dL (ref 6.1–8.1)

## 2019-12-31 LAB — LIPID PANEL
Cholesterol: 126 mg/dL (ref <200)
HDL: 42 mg/dL — ABNORMAL LOW (ref >=50)
LDL Cholesterol (Calc): 68 mg/dL (calc)
Non-HDL Cholesterol (Calc): 84 mg/dL (calc) (ref <130)
Total CHOL/HDL Ratio: 3 (calc) (ref <5.0)
Triglycerides: 78 mg/dL (ref <150)

## 2019-12-31 LAB — URINALYSIS, ROUTINE W REFLEX MICROSCOPIC
Bilirubin Urine: NEGATIVE
Glucose, UA: NEGATIVE
Hgb urine dipstick: NEGATIVE
Ketones, ur: NEGATIVE
Leukocytes,Ua: NEGATIVE
Nitrite: NEGATIVE
Protein, ur: NEGATIVE
Specific Gravity, Urine: 1.015 (ref 1.001–1.03)
pH: 7.5 (ref 5.0–8.0)

## 2019-12-31 LAB — CBC WITH DIFFERENTIAL/PLATELET
Absolute Monocytes: 616 cells/uL (ref 200–950)
Basophils Absolute: 63 cells/uL (ref 0–200)
Basophils Relative: 0.8 %
Eosinophils Absolute: 150 cells/uL (ref 15–500)
Eosinophils Relative: 1.9 %
HCT: 38.5 % (ref 35.0–45.0)
Hemoglobin: 12.7 g/dL (ref 11.7–15.5)
Lymphs Abs: 1714 cells/uL (ref 850–3900)
MCH: 25.1 pg — ABNORMAL LOW (ref 27.0–33.0)
MCHC: 33 g/dL (ref 32.0–36.0)
MCV: 76.2 fL — ABNORMAL LOW (ref 80.0–100.0)
MPV: 11.9 fL (ref 7.5–12.5)
Monocytes Relative: 7.8 %
Neutro Abs: 5356 cells/uL (ref 1500–7800)
Neutrophils Relative %: 67.8 %
Platelets: 233 10*3/uL (ref 140–400)
RBC: 5.05 10*6/uL (ref 3.80–5.10)
RDW: 16 % — ABNORMAL HIGH (ref 11.0–15.0)
Total Lymphocyte: 21.7 %
WBC: 7.9 10*3/uL (ref 3.8–10.8)

## 2019-12-31 LAB — HEMOGLOBIN A1C
Hgb A1c MFr Bld: 6.5 % of total Hgb — ABNORMAL HIGH (ref <5.7)
Mean Plasma Glucose: 140 (calc)
eAG (mmol/L): 7.7 (calc)

## 2019-12-31 LAB — IRON,TIBC AND FERRITIN PANEL
%SAT: 29 % (calc) (ref 16–45)
Ferritin: 104 ng/mL (ref 16–232)
Iron: 71 ug/dL (ref 45–160)
TIBC: 245 mcg/dL (calc) — ABNORMAL LOW (ref 250–450)

## 2019-12-31 LAB — MICROALBUMIN, URINE: Microalb, Ur: 0.4 mg/dL

## 2020-01-02 ENCOUNTER — Encounter: Payer: Self-pay | Admitting: Family Medicine

## 2020-01-09 ENCOUNTER — Telehealth: Payer: Self-pay

## 2020-01-09 NOTE — Telephone Encounter (Signed)
Called patient and informed her of what [facility] has said and she told me that she spoke with them on yesterday, made arrangements and has to call them back today then they will schedule her.  Referral info will be updated.

## 2020-02-10 NOTE — Progress Notes (Signed)
Patient ID: Elizabeth Scott, female    DOB: July 24, 1961, 59 y.o.   MRN: [id number]  PCP: Danelle Berry, [name]  Chief Complaint  Patient presents with  . Toe Pain    Right foot/great toe x1 week    Subjective:   Elizabeth Scott is a 59 y.o. female, presents to clinic with CC of the following:  Chief Complaint  Patient presents with  . Toe Pain    Right foot/great toe x1 week    HPI:  Patient is a 59 year old female Last visit was with Danelle Berry on 12/30/2019 with that note reviewed. Follows up today with pain in her right great toe.  She notes it started about a week ago, and feels it along the border of the nail and has continued and a little more uncomfortable in the more recent past.  There has been no drainage.  It is also red in this region where it is uncomfortable.  She has no history of gout. She has no pain moving the toe joint.  She had no trauma she can recall before this came on.  She notes she does occasionally wear a tighter shoe presently for her work. She has been soaking it every other day, and applying an antibiotic cream a couple times starting a couple days ago. She does have a history of diabetes, with her last A1c 6.5 on December 30, 2019    Patient Active Problem List   Diagnosis Date Noted  . Uncontrolled type 2 diabetes mellitus with hyperglycemia, without long-term current use of insulin (HCC) 09/25/2018  . Anemia, iron deficiency 10/17/2017  . Breast cancer screening 09/08/2016  . Left shoulder pain 09/08/2016  . Encounter for medication monitoring 03/31/2016  . 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 Walnut allergy 08/30/2015  . Preventative health care 08/30/2015  . Type 2 diabetes mellitus without complication, without long-term current use of insulin (HCC) 04/27/2015  . Hypercholesterolemia 04/27/2015  . Essential hypertension, benign 04/27/2015  . Microcytosis 04/27/2015  . Morbid obesity (HCC) 04/27/2015  . Gastroesophageal  reflux disease without esophagitis 01/08/2015      Current Outpatient Medications:  .  albuterol (PROVENTIL HFA;VENTOLIN HFA) 108 (90 BASE) MCG/ACT inhaler, Inhale 2 puffs into the lungs every 2 (two) hours as needed for wheezing or shortness of breath., Disp: , Rfl:  .  amLODipine (NORVASC) 5 MG tablet, TAKE 1 TABLET BY MOUTH EVERY DAY, Disp: 90 tablet, Rfl: 3 .  aspirin 81 MG tablet, Take 81 mg by mouth daily., Disp: , Rfl:  .  atorvastatin (LIPITOR) 80 MG tablet, Take 1 tablet (80 mg total) by mouth daily., Disp: 90 tablet, Rfl: 3 .  cholecalciferol (VITAMIN D3) 25 MCG (1000 UT) tablet, Take 1,000 Units by mouth daily., Disp: , Rfl:  .  ezetimibe (ZETIA) 10 MG tablet, Take 1 tablet (10 mg total) by mouth daily., Disp: 90 tablet, Rfl: 3 .  glipiZIDE (GLUCOTROL XL) 10 MG 24 hr tablet, Take 1 tablet (10 mg total) by mouth 2 (two) times daily., Disp: 180 tablet, Rfl: 3 .  losartan (COZAAR) 50 MG tablet, Take 1 tablet (50 mg total) by mouth daily., Disp: 90 tablet, Rfl: 3 .  meloxicam (MOBIC) 15 MG tablet, Take 1 tablet (15 mg total) by mouth daily., Disp: 30 tablet, Rfl: 0 .  omeprazole (PRILOSEC) 20 MG capsule, Take 1 capsule (20 mg total) by mouth 2 (two) times daily., Disp: 180 capsule, Rfl: 2 .  pioglitazone (ACTOS) 15 MG  tablet, Take 1 tablet (15 mg total) by mouth daily., Disp: 90 tablet, Rfl: 1 .  TRADJENTA 5 MG TABS tablet, TAKE 1 TABLET BY MOUTH EVERY DAY, Disp: 90 tablet, Rfl: 1 .  sitaGLIPtin (JANUVIA) 25 MG tablet, Take 1 tablet (25 mg total) by mouth daily. (Patient not taking: Reported on 12/30/2019), Disp: 90 tablet, Rfl: 3   Allergies  Allergen Reactions  . Metformin And Related Other (See Comments)    Rash, Thrush   . Other     walnuts     Past Surgical History:  Procedure Laterality Date  . ABDOMINAL HYSTERECTOMY  2003   still has ovaries-for menorrhagia  . CESAREAN SECTION    . COLONOSCOPY  02/22/14   repeat 5 years  . ESOPHAGOGASTRODUODENOSCOPY  02/22/14  . TUBAL  LIGATION       Family History  Problem Relation Age of Onset  . Diabetes Mother   . Heart disease Mother   . Hypertension Mother   . Stroke Mother   . Diabetes Father   . Hypertension Father   . Asthma Sister   . Diabetes Sister   . Hypertension Sister   . Lung disease Sister   . Hypertension Brother   . Heart disease Brother   . Stroke Brother   . Asthma Sister   . Diabetes Sister   . Hypertension Sister   . Congestive Heart Failure Maternal Grandmother   . Cancer Paternal Grandfather        stomach cancer  . COPD Neg Hx      Social History   Tobacco Use  . Smoking status: Never Smoker  . Smokeless tobacco: Never Used  Substance Use Topics  . Alcohol use: No    With staff assistance, above reviewed with the patient today.  ROS: As per HPI, otherwise no specific complaints on a limited and focused system review   No results found for this or any previous visit (from the past 72 hour(s)).   PHQ2/9: Depression screen [facility]HQ 2/9 02/11/2020 12/30/2019 09/30/2019 08/12/2019 06/30/2019  Decreased Interest 0 0 0 0 0  Down, Depressed, Hopeless 0 0 0 0 0  PHQ - 2 Score 0 0 0 0 0  Altered sleeping 1 1 0 0 0  Tired, decreased energy 0 0 0 0 0  Change in appetite 0 0 0 0 0  Feeling bad or failure about yourself  0 0 0 0 0  Trouble concentrating 0 0 0 0 0  Moving slowly or fidgety/restless 0 0 0 0 0  Suicidal thoughts 0 0 0 0 0  PHQ-9 Score 1 1 0 0 0  Difficult doing work/chores Not difficult at all Not difficult at all Not difficult at all Not difficult at all Not difficult at all  Some recent data might be hidden   PHQ-2/9 Result is neg  Fall Risk: Fall Risk  02/11/2020 12/30/2019 09/30/2019 08/12/2019 06/30/2019  Falls in the past year? 0 0 0 0 0  Number falls in past yr: 0 0 0 0 0  Injury with Fall? 0 0 0 0 0  Follow up - - - - -      Objective:   Vitals:   02/11/20 0827  BP: 134/86  Pulse: 92  Resp: 16  Temp: (!) 97.3 F (36.3 C)  TempSrc: Temporal  SpO2: 98%   Weight: 212 lb 11.2 oz (96.5 kg)  Height: 5\' 3"  (1.6 m)    Body mass index is 37.68 kg/m.  Physical Exam  NAD, masked, pleasant HEENT - [place]/AT, sclera anicteric,  Ext -no marked lower extremity edema, Right great toe-increased erythema along the inner border of the nail extending slightly beyond the base of the nail towards the IP joint, there was some very minimal granulation type tissue evident at the edge of the nail bordering the skin, no purulent discharge, positive tenderness in this erythematous area, Good motion of the IP and MTP joints, with no pain palpating the IP or MTP joints.  No diffuse toe swelling.  The nail border was able to be seen freed more distally, Neuro/psychiatric - affect was not flat, appropriate with conversation  Alert with speech normal  Results for orders placed or performed in visit on 12/30/19  CBC with Differential/Platelet  Result Value Ref Range   WBC 7.9 3.8 - 10.8 Thousand/uL   RBC 5.05 3.80 - 5.10 Million/uL   Hemoglobin 12.7 11.7 - 15.5 g/dL   HCT 20.9 35 - 45 %   MCV 76.2 (L) 80.0 - 100.0 fL   MCH 25.1 (L) 27.0 - 33.0 pg   MCHC 33.0 32.0 - 36.0 g/dL   RDW 47.0 (H) 96.2 - 83.6 %   Platelets 233 140 - 400 Thousand/uL   MPV 11.9 7.5 - 12.5 fL   Neutro Abs 5,356 1,500 - 7,800 cells/uL   Lymphs Abs 1,714 850 - 3,900 cells/uL   Absolute Monocytes 616 200 - 950 cells/uL   Eosinophils Absolute 150 15 - 500 cells/uL   Basophils Absolute 63 0 - 200 cells/uL   Neutrophils Relative % 67.8 %   Total Lymphocyte 21.7 %   Monocytes Relative 7.8 %   Eosinophils Relative 1.9 %   Basophils Relative 0.8 %  COMPLETE METABOLIC PANEL WITH GFR  Result Value Ref Range   Glucose, Bld 127 (H) 65 - 99 mg/dL   BUN 10 7 - 25 mg/dL   Creat 6.29 4.76 - 5.46 mg/dL   GFR, Est Non African American 103 > OR = 60 mL/min/1.3m2   GFR, Est African American 119 > OR = 60 mL/min/1.30m2   BUN/Creatinine Ratio NOT APPLICABLE 6 - 22 (calc)   Sodium 141 135 - 146 mmol/L    Potassium 4.1 3.5 - 5.3 mmol/L   Chloride 103 98 - 110 mmol/L   CO2 31 20 - 32 mmol/L   Calcium 9.2 8.6 - 10.4 mg/dL   Total Protein 6.5 6.1 - 8.1 g/dL   Albumin 4.0 3.6 - 5.1 g/dL   Globulin 2.5 1.9 - 3.7 g/dL (calc)   AG Ratio 1.6 1.0 - 2.5 (calc)   Total Bilirubin 0.7 0.2 - 1.2 mg/dL   Alkaline phosphatase (APISO) 73 37 - 153 U/L   AST 16 10 - 35 U/L   ALT 15 6 - 29 U/L  Lipid panel  Result Value Ref Range   Cholesterol 126 <200 mg/dL   HDL 42 (L) > OR = 50 mg/dL   Triglycerides 78 <503 mg/dL   LDL Cholesterol (Calc) 68 mg/dL (calc)   Total CHOL/HDL Ratio 3.0 <5.0 (calc)   Non-HDL Cholesterol (Calc) 84 <546 mg/dL (calc)  Hemoglobin F6C  Result Value Ref Range   Hgb A1c MFr Bld 6.5 (H) <5.7 % of total Hgb   Mean Plasma Glucose 140 (calc)   eAG (mmol/L) 7.7 (calc)  Urinalysis, Routine w reflex microscopic  Result Value Ref Range   Color, Urine YELLOW YELLOW   APPearance CLEAR CLEAR   Specific Gravity, Urine 1.015 1.001 - 1.03   pH 7.5 5.0 -  8.0   Glucose, UA NEGATIVE NEGATIVE   Bilirubin Urine NEGATIVE NEGATIVE   Ketones, ur NEGATIVE NEGATIVE   Hgb urine dipstick NEGATIVE NEGATIVE   Protein, ur NEGATIVE NEGATIVE   Nitrite NEGATIVE NEGATIVE   Leukocytes,Ua NEGATIVE NEGATIVE  Microalbumin, urine  Result Value Ref Range   Microalb, Ur 0.4 mg/dL   RAM    Iron, TIBC and Ferritin Panel  Result Value Ref Range   Iron 71 45 - 160 mcg/dL   TIBC 101 (L) 751 - 025 mcg/dL (calc)   %SAT 29 16 - 45 % (calc)   Ferritin 104 16 - 232 ng/mL       Assessment & Plan:    1. Paronychia of great toe, right Educated patient on the diagnosis, with possible cause being a ingrown nail component.  She had no trauma prior. Emphasized warm soaks in warm clean water, several times a day. Also emphasized not wearing tight shoes, and if needs a covered shoe, and tennis shoe is more helpful to lessen compression. Also will add Keflex-3 times a day for 5 days Keep the area clean also  emphasized. Information provided in the AVS on paronychia - cephALEXin (KEFLEX) 500 MG capsule; Take 1 capsule (500 mg total) by mouth 3 (three) times daily for 5 days.  Dispense: 15 capsule; Refill: 0   2. Type 2 diabetes mellitus with hyperglycemia, without long-term current use of insulin (HCC) Noted this history, and importance of foot care as a result and she was aware that.  3. Antibiotic-induced yeast infection She notes she often gets yeast infections when she takes an antibiotic, and Diflucan was prescribed to take 1 should any symptoms of concerns arise - fluconazole (DIFLUCAN) 150 MG tablet; Take 1 tablet (150 mg total) by mouth once for 1 dose. As needed  Dispense: 1 tablet; Refill: 0  She is to follow-up if not improving or more problematic over time, and may need podiatry's help if there is more of an ingrown nail concern for potential partial nail removal to help and await her response.     Jamelle Haring, MD 02/11/20 8:50 AM

## 2020-02-11 ENCOUNTER — Encounter: Payer: Self-pay | Admitting: Internal Medicine

## 2020-02-11 ENCOUNTER — Ambulatory Visit (INDEPENDENT_AMBULATORY_CARE_PROVIDER_SITE_OTHER): Payer: BC Managed Care – PPO | Admitting: Internal Medicine

## 2020-02-11 ENCOUNTER — Other Ambulatory Visit: Payer: Self-pay

## 2020-02-11 VITALS — BP 134/86 | HR 92 | Temp 97.3°F | Resp 16 | Ht 63.0 in | Wt 212.7 lb

## 2020-02-11 DIAGNOSIS — T3695XA Adverse effect of unspecified systemic antibiotic, initial encounter: Secondary | ICD-10-CM

## 2020-02-11 DIAGNOSIS — B379 Candidiasis, unspecified: Secondary | ICD-10-CM | POA: Insufficient documentation

## 2020-02-11 DIAGNOSIS — E1165 Type 2 diabetes mellitus with hyperglycemia: Secondary | ICD-10-CM | POA: Diagnosis not present

## 2020-02-11 DIAGNOSIS — L03031 Cellulitis of right toe: Secondary | ICD-10-CM | POA: Diagnosis not present

## 2020-02-11 MED ORDER — CEPHALEXIN 500 MG PO CAPS: 500.0000 mg | ORAL_CAPSULE | Freq: Three times a day (TID) | ORAL | 0 refills | Status: AC

## 2020-02-11 MED ORDER — FLUCONAZOLE 150 MG PO TABS: 150.0000 mg | ORAL_TABLET | Freq: Once | ORAL | 0 refills | Status: AC

## 2020-02-11 NOTE — Patient Instructions (Signed)
Paronychia Paronychia is an infection of the skin. It happens near a fingernail or toenail. It may cause pain and swelling around the nail. In some cases, a fluid-filled bump (abscess) can form near or under the nail. Usually, this condition is not serious, and it clears up with treatment. Follow these instructions at home: Wound care  Keep the affected area clean.  Soak the fingers or toes in warm water as told by your doctor. You may be told to do this for 20 minutes, 2-3 times a day.  Keep the area dry when you are not soaking it.  Do not try to drain a fluid-filled bump on your own.  Follow instructions from your doctor about how to take care of the affected area. Make sure you: ? Wash your hands with soap and water before you change your bandage (dressing). If you cannot use soap and water, use hand sanitizer. ? Change your bandage as told by your doctor.  If you had a fluid-filled bump and your doctor drained it, check the area every day for signs of infection. Check for: ? Redness, swelling, or pain. ? Fluid or blood. ? Warmth. ? Pus or a bad smell. Medicines   Take over-the-counter and prescription medicines only as told by your doctor.  If you were prescribed an antibiotic medicine, take it as told by your doctor. Do not stop taking it even if you start to feel better. General instructions  Avoid touching any chemicals.  Do not pick at the affected area. Prevention  To prevent this condition from happening again: ? Wear rubber gloves when putting your hands in water for washing dishes or other tasks. ? Wear gloves if your hands might touch cleaners or chemicals. ? Avoid injuring your nails or fingertips. ? Do not bite your nails or tear hangnails. ? Do not cut your nails very short. ? Do not cut the skin at the base and sides of the nail (cuticles). ? Use clean nail clippers or scissors when trimming nails. Contact a doctor if:  You feel worse.  You do not get  better.  You have more fluid, blood, or pus coming from the affected area.  Your finger or knuckle is swollen or is hard to move. Get help right away if you have:  A fever or chills.  Redness spreading from the affected area.  Pain in a joint or muscle. Summary  Paronychia is an infection of the skin. It happens near a fingernail or toenail.  This condition may cause pain and swelling around the nail.  Soak the fingers or toes in warm water as told by your doctor.  Usually, this condition is not serious, and it clears up with treatment. This information is not intended to replace advice given to you by your health care provider. Make sure you discuss any questions you have with your health care provider. Document Revised: 08/03/2017 Document Reviewed: 07/30/2017 Elsevier Patient Education  2020 [employer]  

## 2020-02-17 ENCOUNTER — Other Ambulatory Visit: Payer: Self-pay | Admitting: Family Medicine

## 2020-02-17 DIAGNOSIS — M25562 Pain in left knee: Secondary | ICD-10-CM

## 2020-02-17 NOTE — Telephone Encounter (Signed)
Pt called stating that she is going out of town tonight. Please advise.

## 2020-02-17 NOTE — Telephone Encounter (Signed)
Turn around time frame for rx's are 3 business days

## 2020-03-24 ENCOUNTER — Ambulatory Visit: Payer: Self-pay | Admitting: *Deleted

## 2020-03-24 NOTE — Telephone Encounter (Signed)
Pt needs appt to address hyperglycemia

## 2020-03-24 NOTE — Telephone Encounter (Signed)
Calling with high blood sugar today, this morning 164 and after taking oral medications for sugar, cbg now 153. Yesterday fasting cbg180's, Monday's cbg 178. Vomited once this morning along with being lightheaded briefly with sweating. Denies fever/abdomen pain/CP/vision changes/HA/body aches. When asked-very dry mouth and dark urine. Care advice including sips of electrolyte drink all thru the hour for 4-6 hours. If tolerated start with crackers and bland foods. Although she has a glucometer she has not been prescribed any directions or materials. Appointment scheduled for 03/29/20 with other provider since Sheliah Mends will be on vacation.  Routing to provider to see if she would like to prescribe glucometer and strips/testing instructions so patient can record cbgs regularly. [facility] [place].   Reason for Disposition . Blood glucose 70-240 mg/dL (3.9 -38.8 mmol/L)  Answer Assessment - Initial Assessment Questions 1. BLOOD GLUCOSE: "What is your blood glucose level?"     This morning cbg 164 now 153 2. ONSET: "When did you check the blood glucose?"     now 3. USUAL RANGE: "What is your glucose level usually?" (e.g., usual fasting morning value, usual evening value)     Does not usually check  4. KETONES: "Do you check for ketones (urine or blood test strips)?" If yes, askhat does the test show now?"     no 5. TYPE 1 or 2:  "Do you know what type of diabetes you have?"  (e.g., Type 1, Type 2, Gestational; doesn't know)      Type 2  6. INSULIN: "Do you take insulin?" "What type of insulin(s) do you use? What is the mode of delivery? (syringe, pen; injection or pump)?"      none 7. DIABETES PILLS: "Do you take any pills for your diabetes?" If yes, ask: "Have you missed taking any pills recently?"     none 8. OTHER SYMPTOMS: "Do you have any symptoms?" (e.g., fever, frequent urination, difficulty breathing, dizziness, weakness, vomiting)     Vomited once this morning. Lightheaded earlier. 9.  PREGNANCY: "Is there any chance you are pregnant?" "When was your last menstrual period?"     na  Protocols used: DIABETES - HIGH BLOOD SUGAR-A-AH

## 2020-03-24 NOTE — Telephone Encounter (Signed)
Returned patient's call regarding her blood sugars and other symptoms. No answer and no mailbox set up.

## 2020-03-25 NOTE — Telephone Encounter (Signed)
Pt has appt on 03/26/2020 at 2:00 PM  already. Tried calling her again and it says that no vm set up to leave message

## 2020-03-25 NOTE — Progress Notes (Signed)
Name: Elizabeth Scott   MRN: [id number]    DOB: 09-20-1960   Date:03/26/2020       Progress Note  Chief Complaint  Patient presents with  . Diabetes     Subjective:   Elizabeth Scott is a 59 y.o. female, presents to clinic for high blood sugar  DM:   Pt managing DM with Glipizide 56m bid and Actos 15 mg qd tradjenta mailed from old insurance company  -coverage will be different but she has not switched over yet.  Patient is concerned with a few higher blood sugar readings last week ~150-180's Denies: Polyuria, polydipsia, vision changes, neuropathy, hypoglycemia Recent pertinent labs: Lab Results  Component Value Date   HGBA1C 6.5 (H) 12/30/2019   HGBA1C 7.9 (H) 06/30/2019   HGBA1C 6.4 (H) 12/24/2018   Standard of care and health maintenance: Urine Microalbumin:  12/30/19 Foot exam:  12/30/2019 DM eye exam:  [facility] - ACEI/ARB:  Losartan 50 mg qd Statin:  Atorvastatin 865mqd  Knee pain - seeing ortho Refill on mobic requested    Hypertension: Currently managed on losartan 50 mg Norvasc 5 mg pt reports good med compliance and denies any SE.   Blood pressure today is borderline controlled. BP Readings from Last 3 Encounters:  03/26/20 138/86  02/11/20 134/86  12/30/19 124/82   Pt denies CP, SOB, exertional sx, LE edema, palpitation, Ha's, visual disturbances, lightheadedness, hypotension, syncope.   Hyperlipidemia: Currently treated with , pt reports Lipitor 80 mg med compliance Last Lipids: Lab Results  Component Value Date   CHOL 126 12/30/2019   HDL 42 (L) 12/30/2019   LDLCALC 68 12/30/2019   TRIG 78 12/30/2019   CHOLHDL 3.0 12/30/2019   - Denies: Chest pain, shortness of breath, myalgias, claudication       Current Outpatient Medications:  .  albuterol (PROVENTIL HFA;VENTOLIN HFA) 108 (90 BASE) MCG/ACT inhaler, Inhale 2 puffs into the lungs every 2 (two) hours as needed for wheezing or shortness of breath., Disp: , Rfl:  .   amLODipine (NORVASC) 5 MG tablet, TAKE 1 TABLET BY MOUTH EVERY DAY, Disp: 90 tablet, Rfl: 3 .  aspirin 81 MG tablet, Take 81 mg by mouth daily., Disp: , Rfl:  .  atorvastatin (LIPITOR) 80 MG tablet, Take 1 tablet (80 mg total) by mouth daily., Disp: 90 tablet, Rfl: 3 .  cholecalciferol (VITAMIN D3) 25 MCG (1000 UT) tablet, Take 1,000 Units by mouth daily., Disp: , Rfl:  .  ezetimibe (ZETIA) 10 MG tablet, Take 1 tablet (10 mg total) by mouth daily., Disp: 90 tablet, Rfl: 3 .  glipiZIDE (GLUCOTROL XL) 10 MG 24 hr tablet, Take 1 tablet (10 mg total) by mouth 2 (two) times daily., Disp: 180 tablet, Rfl: 3 .  pioglitazone (ACTOS) 15 MG tablet, Take 1 tablet (15 mg total) by mouth daily., Disp: 90 tablet, Rfl: 1 .  losartan (COZAAR) 50 MG tablet, Take 1 tablet (50 mg total) by mouth daily. (Patient not taking: Reported on 03/26/2020), Disp: 90 tablet, Rfl: 3 .  meloxicam (MOBIC) 15 MG tablet, Take 1 tablet (15 mg total) by mouth daily. (Patient not taking: Reported on 03/26/2020), Disp: 30 tablet, Rfl: 0 .  omeprazole (PRILOSEC) 20 MG capsule, Take 1 capsule (20 mg total) by mouth 2 (two) times daily. (Patient not taking: Reported on 03/26/2020), Disp: 180 capsule, Rfl: 2 .  sitaGLIPtin (JANUVIA) 25 MG tablet, Take 1 tablet (25 mg total) by mouth daily. (Patient not taking: Reported on 12/30/2019), Disp: 90  tablet, Rfl: 3 .  TRADJENTA 5 MG TABS tablet, TAKE 1 TABLET BY MOUTH EVERY DAY (Patient not taking: Reported on 03/26/2020), Disp: 90 tablet, Rfl: 1  Patient Active Problem List   Diagnosis Date Noted  . Paronychia of great toe, right 02/11/2020  . Antibiotic-induced yeast infection 02/11/2020  . Uncontrolled type 2 diabetes mellitus with hyperglycemia, without long-term current use of insulin ([place]) 09/25/2018  . Anemia, iron deficiency 10/17/2017  . Breast cancer screening 09/08/2016  . Left shoulder pain 09/08/2016  . Encounter for medication monitoring 03/31/2016  . Hemoglobin C trait ([place])  09/18/2015  . [place] allergy 08/30/2015  . Preventative health care 08/30/2015  . Type 2 diabetes mellitus without complication, without long-term current use of insulin ([place]) 04/27/2015  . Hypercholesterolemia 04/27/2015  . Essential hypertension, benign 04/27/2015  . Microcytosis 04/27/2015  . Morbid obesity ([place]) 04/27/2015  . Gastroesophageal reflux disease without esophagitis 01/08/2015    Past Surgical History:  Procedure Laterality Date  . ABDOMINAL HYSTERECTOMY  2003   still has ovaries-for menorrhagia  . CESAREAN SECTION    . COLONOSCOPY  02/22/14   repeat 5 years  . ESOPHAGOGASTRODUODENOSCOPY  02/22/14  . TUBAL LIGATION      Family History  Problem Relation Age of Onset  . Diabetes Mother   . Heart disease Mother   . Hypertension Mother   . Stroke Mother   . Diabetes Father   . Hypertension Father   . Asthma Sister   . Diabetes Sister   . Hypertension Sister   . Lung disease Sister   . Hypertension Brother   . Heart disease Brother   . Stroke Brother   . Asthma Sister   . Diabetes Sister   . Hypertension Sister   . Congestive Heart Failure Maternal Grandmother   . Cancer Paternal Grandfather        stomach cancer  . COPD Neg Hx     Social History   Tobacco Use  . Smoking status: Never Smoker  . Smokeless tobacco: Never Used  Vaping Use  . Vaping Use: Never used  Substance Use Topics  . Alcohol use: No  . Drug use: No     Allergies  Allergen Reactions  . Metformin And Related Other (See Comments)    Rash, Thrush   . Other     walnuts    Health Maintenance  Topic Date Due  . MAMMOGRAM  09/10/2012  . OPHTHALMOLOGY EXAM  10/29/2017  . INFLUENZA VACCINE  02/29/2020  . COLONOSCOPY  02/10/2021 (Originally 02/23/2019)  . HEMOGLOBIN A1C  06/30/2020  . PAP SMEAR-Modifier  10/17/2020  . FOOT EXAM  12/29/2020  . TETANUS/TDAP  06/11/2021  . PNEUMOCOCCAL POLYSACCHARIDE VACCINE AGE 86-64 HIGH RISK  Completed  . COVID-19 Vaccine  Completed  .  Hepatitis C Screening  Completed  . HIV Screening  Completed    Chart Review Today: I personally reviewed active problem list, medication list, allergies, family history, social history, health maintenance, notes from last encounter, lab results, imaging with the patient/caregiver today.   Review of Systems  10 Systems reviewed and are negative for acute change except as noted in the HPI.  Objective:   Vitals:   03/26/20 1414  BP: 138/86  Pulse: 94  Resp: 18  Temp: 98 F (36.7 C)  TempSrc: Oral  SpO2: 94%  Weight: 209 lb 1.6 oz (94.8 kg)  Height: 5' 3"  (1.6 m)    Body mass index is 37.04 kg/m.  Physical Exam Vitals and  nursing note reviewed.  Constitutional:      General: She is not in acute distress.    Appearance: Normal appearance. She is well-developed. She is obese. She is not ill-appearing, toxic-appearing or diaphoretic.     Interventions: Face mask in place.  HENT:     Head: Normocephalic and atraumatic.     Right Ear: External ear normal.     Left Ear: External ear normal.     Mouth/Throat:     Mouth: Mucous membranes are moist.     Pharynx: Oropharynx is clear.  Eyes:     General: Lids are normal. No scleral icterus.       Right eye: No discharge.        Left eye: No discharge.     Conjunctiva/sclera: Conjunctivae normal.  Neck:     Trachea: Phonation normal. No tracheal deviation.  Cardiovascular:     Rate and Rhythm: Normal rate and regular rhythm.     Pulses: Normal pulses.          Radial pulses are 2+ on the right side and 2+ on the left side.       Posterior tibial pulses are 2+ on the right side and 2+ on the left side.     Heart sounds: Normal heart sounds. No murmur heard.  No friction rub. No gallop.   Pulmonary:     Effort: Pulmonary effort is normal. No respiratory distress.     Breath sounds: Normal breath sounds. No stridor. No wheezing, rhonchi or rales.  Chest:     Chest wall: No tenderness.  Abdominal:     General: Bowel sounds  are normal. There is no distension.     Palpations: Abdomen is soft.     Tenderness: There is no abdominal tenderness. There is no guarding or rebound.  Musculoskeletal:     Right lower leg: No edema.     Left lower leg: No edema.  Lymphadenopathy:     Cervical: Cervical adenopathy present.  Skin:    General: Skin is warm and dry.     Coloration: Skin is not jaundiced or pale.     Findings: No rash.  Neurological:     Mental Status: She is alert.     Motor: No abnormal muscle tone.     Gait: Gait normal.  Psychiatric:        Mood and Affect: Mood normal.        Speech: Speech normal.        Behavior: Behavior normal.         Assessment & Plan:   1. Type 2 diabetes mellitus without complication, without long-term current use of insulin (HCC) Patient diabetes has been well controlled on glipizide 10 mg extended release twice daily, Actos and Tradjenta -although her insurance is changing her blood pressure medication coverage and she has not switched this yet.  She is concerned about high blood sugar readings.  Next week will be able to repeat her A1c so she will return for blood work next week 3 months after her last labs, encouraged her to get the meter strips and lancets and monitor her blood sugar readings only occasionally or if she is feeling unwell and wants to check them advised checking occasionally first in the morning before eating and goal for fasting blood sugars would be between 80 and 130 encourage healthy and exercise She is due for a diabetic eye exam She is taking an ARB and a statin - COMPLETE METABOLIC PANEL  WITH GFR - Hemoglobin A1c - blood glucose meter kit and supplies KIT; Dispense based on patient and insurance preference. Use up to four times daily as directed. (FOR ICD-9 250.00, 250.01).  Dispense: 1 each; Refill: 0  2. Hyperlipidemia, unspecified hyperlipidemia type Compliant with statins, last labs were a few months ago and at goal we reviewed this today  encouraged her to continue to work on healthy diet avoiding transfat and saturated fat increasing whole grains and vegetables lean meats like fish and poultry - COMPLETE METABOLIC PANEL WITH GFR  3. Essential hypertension, benign Blood pressure is slightly above goal the patient is endorsing knee pain and she does seem a little anxious today about her blood sugars.  Continue managing blood pressure with Dash diet, healthy lifestyle, amlodipine 5 mg and losartan 50 mg - COMPLETE METABOLIC PANEL WITH GFR  4. Gastroesophageal reflux disease without esophagitis He briefly endorses some upset stomach indigestion, burning and reflux encouraged her to try over-the-counter medications like Pepcid twice daily or do a 1 to 2-week treatment with Prilosec, avoid spicy food citrus, caffeine and watch and learn for food triggers, avoid overeating, do not eat several hours before bedtime  5. Acute pain of left knee Patient endorses intermittent and continued left knee pain she request a refill of her meloxicam today - meloxicam (MOBIC) 15 MG tablet; Take 1 tablet (15 mg total) by mouth daily as needed for pain (knee pain (do not take with other NSAIDs)). (Patient not taking: Reported on 03/26/2020)  Dispense: 30 tablet; Refill: 5    Return in about 3 months (around 07/05/2020) for routine f/up DM/HTN/HLD.   Delsa Grana, [name] 03/26/20 2:27 PM

## 2020-03-26 ENCOUNTER — Other Ambulatory Visit: Payer: Self-pay

## 2020-03-26 ENCOUNTER — Ambulatory Visit (INDEPENDENT_AMBULATORY_CARE_PROVIDER_SITE_OTHER): Payer: BC Managed Care – PPO | Admitting: Family Medicine

## 2020-03-26 ENCOUNTER — Encounter: Payer: Self-pay | Admitting: Family Medicine

## 2020-03-26 VITALS — BP 138/86 | HR 94 | Temp 98.0°F | Resp 18 | Ht 63.0 in | Wt 209.1 lb

## 2020-03-26 DIAGNOSIS — E119 Type 2 diabetes mellitus without complications: Secondary | ICD-10-CM | POA: Diagnosis not present

## 2020-03-26 DIAGNOSIS — E785 Hyperlipidemia, unspecified: Secondary | ICD-10-CM

## 2020-03-26 DIAGNOSIS — I10 Essential (primary) hypertension: Secondary | ICD-10-CM | POA: Diagnosis not present

## 2020-03-26 DIAGNOSIS — K219 Gastro-esophageal reflux disease without esophagitis: Secondary | ICD-10-CM | POA: Diagnosis not present

## 2020-03-26 DIAGNOSIS — M25562 Pain in left knee: Secondary | ICD-10-CM

## 2020-03-26 MED ORDER — MELOXICAM 15 MG PO TABS: 15.0000 mg | ORAL_TABLET | Freq: Every day | ORAL | 5 refills | Status: DC | PRN

## 2020-03-26 MED ORDER — BLOOD GLUCOSE MONITOR KIT: PACK | 0 refills | Status: DC

## 2020-03-26 NOTE — Patient Instructions (Addendum)
Blood Glucose Monitoring, Adult  Monitoring your blood sugar (glucose) is an important part of managing your diabetes (diabetes mellitus). Blood glucose monitoring involves checking your blood glucose as often as directed and keeping a record (log) of your results over time. Checking your blood glucose regularly and keeping a blood glucose log can:  Help you and your health care provider adjust your diabetes management plan as needed, including your medicines or insulin.  Help you understand how food, exercise, illnesses, and medicines affect your blood glucose.  Let you know what your blood glucose is at any time. You can quickly find out if you have low blood glucose (hypoglycemia) or high blood glucose (hyperglycemia). Your health care provider will set individualized treatment goals for you. Your goals will be based on your age, other medical conditions you have, and how you respond to diabetes treatment. Generally, the goal of treatment is to maintain the following blood glucose levels:  Before meals (preprandial): 80-130 mg/dL (1.6-1.04.4-7.2 mmol/L).  After meals (postprandial): below 180 mg/dL (10 mmol/L).  A1c level: less than 7%. Supplies needed:  Blood glucose meter.  Test strips for your meter. Each meter has its own strips. You must use the strips that came with your meter.  A needle to prick your finger (lancet). Do not use a lancet more than one time.  A device that holds the lancet (lancing device).  A journal or log book to write down your results. How to check your blood glucose  1. Wash your hands with soap and water. 2. Prick the side of your finger (not the tip) with the lancet. Use a different finger each time. 3. Gently rub the finger until a small drop of blood appears. 4. Follow instructions that come with your meter for inserting the test strip, applying blood to the strip, and using your blood glucose meter. 5. Write down your result and any notes. Some meters  allow you to use areas of your body other than your finger (alternative sites) to test your blood. The most common alternative sites are:  Forearm.  Thigh.  Palm of the hand. If you think you may have hypoglycemia, or if you have a history of not knowing when your blood glucose is getting low (hypoglycemia unawareness), do not use alternative sites. Use your finger instead. Alternative sites may not be as accurate as the fingers, because blood flow is slower in these areas. This means that the result you get may be delayed, and it may be different from the result that you would get from your finger. Follow these instructions at home: Blood glucose log   Every time you check your blood glucose, write down your result. Also write down any notes about things that may be affecting your blood glucose, such as your diet and exercise for the day. This information can help you and your health care provider: ? Look for patterns in your blood glucose over time. ? Adjust your diabetes management plan as needed.  Check if your meter allows you to download your records to a computer. Most glucose meters store a record of glucose readings in the meter. If you have type 1 diabetes:  Check your blood glucose 2 or more times a day.  Also check your blood glucose: ? Before every insulin injection. ? Before and after exercise. ? Before meals. ? 2 hours after a meal. ? Occasionally between 2:00 a.m. and 3:00 a.m., as directed. ? Before potentially dangerous tasks, like driving or using heavy  machinery. ? At bedtime.  You may need to check your blood glucose more often, up to 6-10 times a day, if you: ? Use an insulin pump. ? Need multiple daily injections (MDI). ? Have diabetes that is not well-controlled. ? Are ill. ? Have a history of severe hypoglycemia. ? Have hypoglycemia unawareness. If you have type 2 diabetes:  If you take insulin or other diabetes medicines, check your blood glucose 2 or  more times a day.  If you are on intensive insulin therapy, check your blood glucose 4 or more times a day. Occasionally, you may also need to check between 2:00 a.m. and 3:00 a.m., as directed.  Also check your blood glucose: ? Before and after exercise. ? Before potentially dangerous tasks, like driving or using heavy machinery.  You may need to check your blood glucose more often if: ? Your medicine is being adjusted. ? Your diabetes is not well-controlled. ? You are ill. General tips  Always keep your supplies with you.  If you have questions or need help, all blood glucose meters have a 24-hour "hotline" phone number that you can call. You may also contact your health care provider.  After you use a few boxes of test strips, adjust (calibrate) your blood glucose meter by following instructions that came with your meter. Contact a health care provider if:  Your blood glucose is at or above 240 mg/dL (67.6 mmol/L) for 2 days in a row.  You have been sick or have had a fever for 2 days or longer, and you are not getting better.  You have any of the following problems for more than 6 hours: ? You cannot eat or drink. ? You have nausea or vomiting. ? You have diarrhea. Get help right away if:  Your blood glucose is lower than 54 mg/dL (3 mmol/L).  You become confused or you have trouble thinking clearly.  You have difficulty breathing.  You have moderate or large ketone levels in your urine. Summary  Monitoring your blood sugar (glucose) is an important part of managing your diabetes (diabetes mellitus).  Blood glucose monitoring involves checking your blood glucose as often as directed and keeping a record (log) of your results over time.  Your health care provider will set individualized treatment goals for you. Your goals will be based on your age, other medical conditions you have, and how you respond to diabetes treatment.  Every time you check your blood glucose,  write down your result. Also write down any notes about things that may be affecting your blood glucose, such as your diet and exercise for the day. This information is not intended to replace advice given to you by your health care provider. Make sure you discuss any questions you have with your health care provider. Document Revised: 05/10/2018 Document Reviewed: 12/27/2015 Elsevier Patient Education  2020 [employer]    Hyperglycemia Hyperglycemia is when the sugar (glucose) level in your blood is too high. It may not cause symptoms. If you do have symptoms, they may include warning signs, such as:  Feeling more thirsty than normal.  Hunger.  Feeling tired.  Needing to pee (urinate) more than normal.  Blurry eyesight (vision). You may get other symptoms as it gets worse, such as:  Dry mouth.  Not being hungry (loss of appetite).  Fruity-smelling breath.  Weakness.  Weight gain or loss that is not planned. Weight loss may be fast.  A tingling or numb feeling in your hands or  feet.  Headache.  Skin that does not bounce back quickly when it is lightly pinched and released (poor skin turgor).  Pain in your belly (abdomen).  Cuts or bruises that heal slowly. High blood sugar can happen to people who do or do not have diabetes. High blood sugar can happen slowly or quickly, and it can be an emergency. Follow these instructions at home: General instructions  Take over-the-counter and prescription medicines only as told by your doctor.  Do not use products that contain nicotine or tobacco, such as cigarettes and e-cigarettes. If you need help quitting, ask your doctor.  Limit alcohol intake to no more than 1 drink per day for nonpregnant women and 2 drinks per day for men. One drink equals 12 oz of beer, 5 oz of wine, or 1 oz of hard liquor.  Manage stress. If you need help with this, ask your doctor.  Keep all follow-up visits as told by your doctor. This is  important. Eating and drinking   Stay at a healthy weight.  Exercise regularly, as told by your doctor.  Drink enough fluid, especially when you: ? Exercise. ? Get sick. ? Are in hot temperatures.  Eat healthy foods, such as: ? Low-fat (lean) proteins. ? Complex carbs (complex carbohydrates), such as whole wheat bread or brown rice. ? Fresh fruits and vegetables. ? Low-fat dairy products. ? Healthy fats.  Drink enough fluid to keep your pee (urine) clear or pale yellow. If you have diabetes:   Make sure you know the symptoms of hyperglycemia.  Follow your diabetes management plan, as told by your doctor. Make sure you: ? Take insulin and medicines as told. ? Follow your exercise plan. ? Follow your meal plan. Eat on time. Do not skip meals. ? Check your blood sugar as often as told. Make sure to check before and after exercise. If you exercise longer or in a different way than you normally do, check your blood sugar more often. ? Follow your sick day plan whenever you cannot eat or drink normally. Make this plan ahead of time with your doctor.  Share your diabetes management plan with people in your workplace, school, and household.  Check your urine for ketones when you are ill and as told by your doctor.  Carry a card or wear jewelry that says that you have diabetes. Contact a doctor if:  Your blood sugar level is higher than 240 mg/dL (16.1 mmol/L) for 2 days in a row.  You have problems keeping your blood sugar in your target range.  High blood sugar happens often for you. Get help right away if:  You have trouble breathing.  You have a change in how you think, feel, or act (mental status).  You feel sick to your stomach (nauseous), and that feeling does not go away.  You cannot stop throwing up (vomiting). These symptoms may be an emergency. Do not wait to see if the symptoms will go away. Get medical help right away. Call your local emergency services (911  in the [place] Do not drive yourself to the hospital. Summary  Hyperglycemia is when the sugar (glucose) level in your blood is too high.  High blood sugar can happen to people who do or do not have diabetes.  Make sure you drink enough fluids, eat healthy foods, and exercise regularly.  Contact your doctor if you have problems keeping your blood sugar in your target range. This information is not intended to replace advice given  to you by your health care provider. Make sure you discuss any questions you have with your health care provider. Document Revised: 04/03/2016 Document Reviewed: 04/03/2016 Elsevier Patient Education  2020 [employer].

## 2020-03-29 ENCOUNTER — Ambulatory Visit: Payer: Self-pay | Admitting: Family Medicine

## 2020-04-08 LAB — HEMOGLOBIN A1C
Hgb A1c MFr Bld: 6.1 % of total Hgb — ABNORMAL HIGH (ref <5.7)
Mean Plasma Glucose: 128 (calc)
eAG (mmol/L): 7.1 (calc)

## 2020-04-08 LAB — COMPLETE METABOLIC PANEL WITH GFR
AG Ratio: 1.7 (calc) (ref 1.0–2.5)
ALT: 14 U/L (ref 6–29)
AST: 13 U/L (ref 10–35)
Albumin: 3.7 g/dL (ref 3.6–5.1)
Alkaline phosphatase (APISO): 68 U/L (ref 37–153)
BUN: 13 mg/dL (ref 7–25)
CO2: 29 mmol/L (ref 20–32)
Calcium: 9 mg/dL (ref 8.6–10.4)
Chloride: 104 mmol/L (ref 98–110)
Creat: 0.61 mg/dL (ref 0.50–1.05)
GFR, Est African American: 116 mL/min/{1.73_m2} (ref >=60)
GFR, Est Non African American: 100 mL/min/{1.73_m2} (ref >=60)
Globulin: 2.2 g/dL (calc) (ref 1.9–3.7)
Glucose, Bld: 105 mg/dL — ABNORMAL HIGH (ref 65–99)
Potassium: 4 mmol/L (ref 3.5–5.3)
Sodium: 139 mmol/L (ref 135–146)
Total Bilirubin: 1 mg/dL (ref 0.2–1.2)
Total Protein: 5.9 g/dL — ABNORMAL LOW (ref 6.1–8.1)

## 2020-04-26 ENCOUNTER — Other Ambulatory Visit: Payer: Self-pay | Admitting: Family Medicine

## 2020-04-26 DIAGNOSIS — E119 Type 2 diabetes mellitus without complications: Secondary | ICD-10-CM

## 2020-04-26 MED ORDER — LINAGLIPTIN 5 MG PO TABS: 5.0000 mg | ORAL_TABLET | Freq: Every day | ORAL | 1 refills | Status: DC

## 2020-04-26 NOTE — Telephone Encounter (Signed)
Copied from CRM [id number]. Topic: Quick Communication - Rx Refill/Question >> Apr 26, 2020  4:52 PM Mcneil, Ja-Kwan wrote: Medication: TRADJENTA 5 MG TABS tablet  Has the patient contacted their pharmacy? yes  Preferred Pharmacy (with phone number or street name): [id number] - [place], [place] - [address] AT [facility]  Phone: [phone]  Fax: [phone]  Agent: Please be advised that RX refills may take up to 3 business days. We ask that you follow-up with your pharmacy.

## 2020-04-27 ENCOUNTER — Telehealth: Payer: Self-pay

## 2020-04-27 NOTE — Telephone Encounter (Signed)
Elizabeth Scott will not just call something in.  She will need appt to address or try OTC med first.

## 2020-04-27 NOTE — Telephone Encounter (Signed)
Pt said that she would just try something over the counter

## 2020-04-27 NOTE — Telephone Encounter (Signed)
Copied from CRM [id number]. Topic: General - Other >> Apr 26, 2020  4:56 PM Mcneil, Ja-Kwan wrote: Reason for CRM: Pt stated she normally gets a Rx for diflucan for yeast infection. Pt requests Rx for diflucan to be sent to her pharmacy

## 2020-04-28 ENCOUNTER — Telehealth: Payer: Self-pay

## 2020-04-28 NOTE — Telephone Encounter (Signed)
Patients ins will not cover Tradjenta, they state will cover Januvia.  Please change

## 2020-04-29 ENCOUNTER — Encounter: Payer: Self-pay | Admitting: Family Medicine

## 2020-04-29 MED ORDER — SITAGLIPTIN PHOSPHATE 25 MG PO TABS
25.0000 mg | ORAL_TABLET | Freq: Every day | ORAL | 3 refills | Status: DC
Start: 2020-04-29 — End: 2021-02-28

## 2020-04-29 MED ORDER — FLUCONAZOLE 150 MG PO TABS
150.0000 mg | ORAL_TABLET | ORAL | 1 refills | Status: DC | PRN
Start: 2020-04-29 — End: 2020-07-07

## 2020-04-29 NOTE — Telephone Encounter (Signed)
Insurance changes formulary earlier this year - I previously changed her Rx's to reflect this in April, d/c tradjenta and sent in [place].  At last appt pt stated she was not taking it and she was somehow getting tradjenta from old insurance company.  She has called in requesting Diflucan and has had a few office visits for this -both Tradjenta and Januvia have a common side effect to cause genital yeast infections.   ADVERSE EFFECTS Genitourinary tract ?Infection - In clinical trials, side effects of SGLT2 inhibitors include an approximate two- to fourfold increased incidence of vulvovaginal candidiasis, reported in up to 10 to 15 percent of women    I sent in Januvia again to her pharmacy and patient will be contacted to notify her of the insurance formulary change.  I will also be asking staff to notify the patient of the side effect that is likely causing her yeast infection symptoms that seem to be requiring Diflucan prescription frequently.  Fortunately her last A1c was so well controlled that she may be able to stop this class of medications and see if her diabetes remains at goal without it or we may look at other medication options at her next appointment   Diflucan additionally sent in, since I saw the messages asking for it and its likely due to a med SE.   Meds ordered this encounter  Medications  . sitaGLIPtin (JANUVIA) 25 MG tablet    Sig: Take 1 tablet (25 mg total) by mouth daily.    Dispense:  90 tablet    Refill:  3  . fluconazole (DIFLUCAN) 150 MG tablet    Sig: Take 1 tablet (150 mg total) by mouth every 3 (three) days as needed (for vaginal itching/yeast infection sx).    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

## 2020-04-30 NOTE — Telephone Encounter (Signed)
Pt.notified

## 2020-05-04 ENCOUNTER — Ambulatory Visit: Payer: BC Managed Care – PPO | Admitting: Internal Medicine

## 2020-05-04 ENCOUNTER — Ambulatory Visit: Payer: BC Managed Care – PPO | Admitting: Family Medicine

## 2020-07-05 ENCOUNTER — Ambulatory Visit: Payer: BC Managed Care – PPO | Admitting: Family Medicine

## 2020-07-07 ENCOUNTER — Ambulatory Visit (INDEPENDENT_AMBULATORY_CARE_PROVIDER_SITE_OTHER): Payer: BC Managed Care – PPO | Admitting: Family Medicine

## 2020-07-07 ENCOUNTER — Other Ambulatory Visit: Payer: Self-pay

## 2020-07-07 ENCOUNTER — Encounter: Payer: Self-pay | Admitting: Family Medicine

## 2020-07-07 VITALS — BP 126/82 | HR 81 | Temp 98.0°F | Resp 18 | Ht 63.0 in | Wt 214.3 lb

## 2020-07-07 DIAGNOSIS — E119 Type 2 diabetes mellitus without complications: Secondary | ICD-10-CM | POA: Diagnosis not present

## 2020-07-07 DIAGNOSIS — E785 Hyperlipidemia, unspecified: Secondary | ICD-10-CM | POA: Diagnosis not present

## 2020-07-07 DIAGNOSIS — K219 Gastro-esophageal reflux disease without esophagitis: Secondary | ICD-10-CM

## 2020-07-07 DIAGNOSIS — I10 Essential (primary) hypertension: Secondary | ICD-10-CM | POA: Diagnosis not present

## 2020-07-07 DIAGNOSIS — Z1231 Encounter for screening mammogram for malignant neoplasm of breast: Secondary | ICD-10-CM

## 2020-07-07 DIAGNOSIS — M25562 Pain in left knee: Secondary | ICD-10-CM

## 2020-07-07 DIAGNOSIS — Z6837 Body mass index (BMI) 37.0-37.9, adult: Secondary | ICD-10-CM

## 2020-07-07 LAB — POCT GLYCOSYLATED HEMOGLOBIN (HGB A1C): Hemoglobin A1C: 6.6 % — AB (ref 4.0–5.6)

## 2020-07-07 NOTE — Patient Instructions (Addendum)
[facility] [place],  [place]  [id number] Get Driving Directions Main: [phone]  Cut back on your glipizide or please let me know if you have low sugar episodes:  Hypoglycemia Hypoglycemia is when the sugar (glucose) level in your blood is too low. Signs of low blood sugar may include:  Feeling: ? Hungry. ? Worried or nervous (anxious). ? Sweaty and clammy. ? Confused. ? Dizzy. ? Sleepy. ? Sick to your stomach (nauseous).  Having: ? A fast heartbeat. ? A headache. ? A change in your vision. ? Tingling or no feeling (numbness) around your mouth, lips, or tongue. ? Jerky movements that you cannot control (seizure).  Having trouble with: ? Moving (coordination). ? Sleeping. ? Passing out (fainting). ? Getting upset easily (irritability). Low blood sugar can happen to people who have diabetes and people who do not have diabetes. Low blood sugar can happen quickly, and it can be an emergency. Treating low blood sugar Low blood sugar is often treated by eating or drinking something sugary right away, such as:  Fruit juice, 4-6 oz (120-150 mL).  Regular soda (not diet soda), 4-6 oz (120-150 mL).  Low-fat milk, 4 oz (120 mL).  Several pieces of hard candy.  Sugar or honey, 1 Tbsp (15 mL). Treating low blood sugar if you have diabetes If you can think clearly and swallow safely, follow the 15:15 rule:  Take 15 grams of a fast-acting carb (carbohydrate). Talk with your doctor about how much you should take.  Always keep a source of fast-acting carb with you, such as: ? Sugar tablets (glucose pills). Take 3-4 pills. ? 6-8 pieces of hard candy. ? 4-6 oz (120-150 mL) of fruit juice. ? 4-6 oz (120-150 mL) of regular (not diet) soda. ? 1 Tbsp (15 mL) honey or sugar.  Check your blood sugar 15 minutes after you take the carb.  If your blood sugar is still at or below 70 mg/dL (3.9 mmol/L), take 15 grams of a carb  again.  If your blood sugar does not go above 70 mg/dL (3.9 mmol/L) after 3 tries, get help right away.  After your blood sugar goes back to normal, eat a meal or a snack within 1 hour.  Treating very low blood sugar If your blood sugar is at or below 54 mg/dL (3 mmol/L), you have very low blood sugar (severe hypoglycemia). This may also cause:  Passing out.  Jerky movements you cannot control (seizure).  Losing consciousness (coma). This is an emergency. Do not wait to see if the symptoms will go away. Get medical help right away. Call your local emergency services (911 in the [place] Do not drive yourself to the hospital. If you have very low blood sugar and you cannot eat or drink, you may need a glucagon shot (injection). A family member or friend should learn how to check your blood sugar and how to give you a glucagon shot. Ask your doctor if you need to have a glucagon shot kit at home. Follow these instructions at home: General instructions  Take over-the-counter and prescription medicines only as told by your doctor.  Stay aware of your blood sugar as told by your doctor.  Limit alcohol intake to no more than 1 drink a day for nonpregnant women and 2 drinks a day for men. One drink equals 12 oz of beer (355 mL), 5 oz of wine (148 mL), or 1 oz of hard liquor (44 mL).  Keep all follow-up visits as told by your doctor. This is important. If you have diabetes:   Follow your diabetes care plan as told by your doctor. Make sure you: ? Know the signs of low blood sugar. ? Take your medicines as told. ? Follow your exercise and meal plan. ? Eat on time. Do not skip meals. ? Check your blood sugar as often as told by your doctor. Always check it before and after exercise. ? Follow your sick day plan when you cannot eat or drink normally. Make this plan ahead of time with your doctor.  Share your diabetes care plan with: ? Your work or school. ? People you live with.  Check  your pee (urine) for ketones: ? When you are sick. ? As told by your doctor.  Carry a card or wear jewelry that says you have diabetes. Contact a doctor if:  You have trouble keeping your blood sugar in your target range.  You have low blood sugar often. Get help right away if:  You still have symptoms after you eat or drink something sugary.  Your blood sugar is at or below 54 mg/dL (3 mmol/L).  You have jerky movements that you cannot control.  You pass out. These symptoms may be an emergency. Do not wait to see if the symptoms will go away. Get medical help right away. Call your local emergency services (911 in the [place] Do not drive yourself to the hospital. Summary  Hypoglycemia happens when the level of sugar (glucose) in your blood is too low.  Low blood sugar can happen to people who have diabetes and people who do not have diabetes. Low blood sugar can happen quickly, and it can be an emergency.  Make sure you know the signs of low blood sugar and know how to treat it.  Always keep a source of sugar (fast-acting carb) with you to treat low blood sugar. This information is not intended to replace advice given to you by your health care provider. Make sure you discuss any questions you have with your health care provider. Document Revised: 11/07/2018 Document Reviewed: 08/20/2015 Elsevier Patient Education  2020 [employer].

## 2020-07-07 NOTE — Progress Notes (Signed)
Name: Elizabeth Scott   MRN: [id number]    DOB: 09/14/1960   Date:07/07/2020       Progress Note  Chief Complaint  Patient presents with  . Diabetes  . Hyperlipidemia  . Hypertension     Subjective:   Elizabeth Scott is a 59 y.o. female, presents to clinic for   Left knee pain, using mobic prn, usually is using tylenol and ibup  DM:   Pt managing DM with jardiance, actos (? +/-) glipizide 10 mg bid Reports good med compliance Pt has no SE from meds. Blood sugars 100's, a few low episodes, rare, lowest was 66 Denies: Polyuria, polydipsia, vision changes, neuropathy, hypoglycemia Recent pertinent labs: Lab Results  Component Value Date   HGBA1C 6.1 (H) 04/07/2020   HGBA1C 6.5 (H) 12/30/2019   HGBA1C 7.9 (H) 06/30/2019   Standard of care and health maintenance: Foot exam:  done DM eye exam:  Pt has scheduled ACEI/ARB:  on Statin:  One  Hypertension:  Currently managed on norvasc and losartang Pt reports good med compliance and denies any SE.   Blood pressure today is well controlled. BP Readings from Last 3 Encounters:  07/07/20 126/82  03/26/20 138/86  02/11/20 134/86   Pt denies CP, SOB, exertional sx, LE edema, palpitation, Ha's, visual disturbances, lightheadedness, hypotension, syncope.  GERD: on omeprazole - when she took mobic she had some worse gerd sx, mostly nausea, doesn't seem as irritated when she takes ibuprofen  Left knee pain/stiffness/swelling - managed by [facility] orth[facility] - had arthrocentesis and steroid shot, some improvement, she will be following up in Jan - will likely be getting another injection and planning for imaging or a procedure  HLD - compliant with lipitor 80 and zetia - last lipids were at goal, good med compliance, no SE or concerns  Obesity:  Weight up a little Wt Readings from Last 5 Encounters:  07/07/20 214 lb 4.8 oz (97.2 kg)  03/26/20 209 lb 1.6 oz (94.8 kg)  02/11/20 212 lb 11.2 oz (96.5 kg)  12/30/19 222 lb  9.6 oz (101 kg)  09/30/19 210 lb (95.3 kg)   BMI Readings from Last 5 Encounters:  07/07/20 37.96 kg/m  03/26/20 37.04 kg/m  02/11/20 37.68 kg/m  12/30/19 39.43 kg/m  09/30/19 37.20 kg/m  limited activity - discussed possibly trying GLP-1 for DM management and decreasing other meds, pt may see weightloss SE/benefit?  She is adverse to needles, but willing to look into the weekly GLP-1 injectable meds - info given and reviewed today      Current Outpatient Medications:  .  albuterol (PROVENTIL HFA;VENTOLIN HFA) 108 (90 BASE) MCG/ACT inhaler, Inhale 2 puffs into the lungs every 2 (two) hours as needed for wheezing or shortness of breath., Disp: , Rfl:  .  amLODipine (NORVASC) 5 MG tablet, TAKE 1 TABLET BY MOUTH EVERY DAY, Disp: 90 tablet, Rfl: 3 .  aspirin 81 MG tablet, Take 81 mg by mouth daily., Disp: , Rfl:  .  atorvastatin (LIPITOR) 80 MG tablet, Take 1 tablet (80 mg total) by mouth daily., Disp: 90 tablet, Rfl: 3 .  cholecalciferol (VITAMIN D3) 25 MCG (1000 UT) tablet, Take 1,000 Units by mouth daily., Disp: , Rfl:  .  ezetimibe (ZETIA) 10 MG tablet, Take 1 tablet (10 mg total) by mouth daily., Disp: 90 tablet, Rfl: 3 .  glipiZIDE (GLUCOTROL XL) 10 MG 24 hr tablet, Take 1 tablet (10 mg total) by mouth 2 (two) times daily., Disp: 180 tablet, Rfl:  3 .  losartan (COZAAR) 50 MG tablet, Take 1 tablet (50 mg total) by mouth daily., Disp: 90 tablet, Rfl: 3 .  pioglitazone (ACTOS) 15 MG tablet, Take 1 tablet (15 mg total) by mouth daily., Disp: 90 tablet, Rfl: 1 .  sitaGLIPtin (JANUVIA) 25 MG tablet, Take 1 tablet (25 mg total) by mouth daily., Disp: 90 tablet, Rfl: 3 .  blood glucose meter kit and supplies KIT, Dispense based on patient and insurance preference. Use up to four times daily as directed. (FOR ICD-9 250.00, 250.01). (Patient not taking: Reported on 07/07/2020), Disp: 1 each, Rfl: 0 .  fluconazole (DIFLUCAN) 150 MG tablet, Take 1 tablet (150 mg total) by mouth every 3 (three)  days as needed (for vaginal itching/yeast infection sx). (Patient not taking: Reported on 07/07/2020), Disp: 2 tablet, Rfl: 1 .  meloxicam (MOBIC) 15 MG tablet, Take 1 tablet (15 mg total) by mouth daily as needed for pain (knee pain (do not take with other NSAIDs)). (Patient not taking: Reported on 03/26/2020), Disp: 30 tablet, Rfl: 5  Patient Active Problem List   Diagnosis Date Noted  . Paronychia of great toe, right 02/11/2020  . Antibiotic-induced yeast infection 02/11/2020  . Uncontrolled type 2 diabetes mellitus with hyperglycemia, without long-term current use of insulin ([place]) 09/25/2018  . Anemia, iron deficiency 10/17/2017  . Breast cancer screening 09/08/2016  . Left shoulder pain 09/08/2016  . Encounter for medication monitoring 03/31/2016  . 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 [place] allergy 08/30/2015  . Preventative health care 08/30/2015  . Type 2 diabetes mellitus without complication, without long-term current use of insulin ([place]) 04/27/2015  . Hyperlipidemia 04/27/2015  . Essential hypertension, benign 04/27/2015  . Microcytosis 04/27/2015  . Morbid obesity ([place]) 04/27/2015  . Gastroesophageal reflux disease without esophagitis 01/08/2015    Past Surgical History:  Procedure Laterality Date  . ABDOMINAL HYSTERECTOMY  2003   still has ovaries-for menorrhagia  . CESAREAN SECTION    . COLONOSCOPY  02/22/14   repeat 5 years  . ESOPHAGOGASTRODUODENOSCOPY  02/22/14  . TUBAL LIGATION      Family History  Problem Relation Age of Onset  . Diabetes Mother   . Heart disease Mother   . Hypertension Mother   . Stroke Mother   . Diabetes Father   . Hypertension Father   . Asthma Sister   . Diabetes Sister   . Hypertension Sister   . Lung disease Sister   . Hypertension Brother   . Heart disease Brother   . Stroke Brother   . Asthma Sister   . Diabetes Sister   . Hypertension Sister   . Congestive Heart Failure Maternal Grandmother   . Cancer Paternal  Grandfather        stomach cancer  . COPD Neg Hx     Social History   Tobacco Use  . Smoking status: Never Smoker  . Smokeless tobacco: Never Used  Vaping Use  . Vaping Use: Never used  Substance Use Topics  . Alcohol use: No  . Drug use: No     Allergies  Allergen Reactions  . Metformin And Related Other (See Comments)    Rash, Thrush   . Other     walnuts    Health Maintenance  Topic Date Due  . MAMMOGRAM  09/10/2012  . OPHTHALMOLOGY EXAM  10/29/2017  . COLONOSCOPY  02/10/2021 (Originally 02/23/2019)  . HEMOGLOBIN A1C  10/05/2020  . PAP SMEAR-Modifier  10/17/2020  . FOOT EXAM  12/29/2020  .  TETANUS/TDAP  06/11/2021  . INFLUENZA VACCINE  Completed  . PNEUMOCOCCAL POLYSACCHARIDE VACCINE AGE 81-64 HIGH RISK  Completed  . COVID-19 Vaccine  Completed  . Hepatitis C Screening  Completed  . HIV Screening  Completed    Chart Review Today: I personally reviewed active problem list, medication list, allergies, family history, social history, health maintenance, notes from last encounter, lab results, imaging with the patient/caregiver today.   Review of Systems  10 Systems reviewed and are negative for acute change except as noted in the HPI.  Objective:   Vitals:   07/07/20 1125  BP: 126/82  Pulse: 81  Resp: 18  Temp: 98 F (36.7 C)  TempSrc: Axillary  SpO2: 97%  Weight: 214 lb 4.8 oz (97.2 kg)  Height: 5' 3"  (1.6 m)    Body mass index is 37.96 kg/m.  Physical Exam Vitals and nursing note reviewed.  Constitutional:      General: She is not in acute distress.    Appearance: Normal appearance. She is well-developed. She is obese. She is not ill-appearing, toxic-appearing or diaphoretic.     Interventions: Face mask in place.  HENT:     Head: Normocephalic and atraumatic.     Right Ear: External ear normal.     Left Ear: External ear normal.  Eyes:     General: Lids are normal. No scleral icterus.       Right eye: No discharge.        Left eye: No  discharge.     Conjunctiva/sclera: Conjunctivae normal.  Neck:     Trachea: Phonation normal. No tracheal deviation.  Cardiovascular:     Rate and Rhythm: Normal rate and regular rhythm.     Pulses: Normal pulses.          Radial pulses are 2+ on the right side and 2+ on the left side.       Posterior tibial pulses are 2+ on the right side and 2+ on the left side.     Heart sounds: Normal heart sounds. No murmur heard.  No friction rub. No gallop.   Pulmonary:     Effort: Pulmonary effort is normal. No respiratory distress.     Breath sounds: Normal breath sounds. No stridor. No wheezing, rhonchi or rales.  Chest:     Chest wall: No tenderness.  Abdominal:     General: Bowel sounds are normal. There is no distension.     Palpations: Abdomen is soft.  Musculoskeletal:     Right lower leg: No edema.     Left lower leg: No edema.  Skin:    General: Skin is warm and dry.     Coloration: Skin is not jaundiced or pale.     Findings: No rash.  Neurological:     Mental Status: She is alert.     Motor: No abnormal muscle tone.     Gait: Gait abnormal.  Psychiatric:        Mood and Affect: Mood normal.        Speech: Speech normal.        Behavior: Behavior normal.         Assessment & Plan:   1. Type 2 diabetes mellitus without complication, without long-term current use of insulin (HCC) Well controlled on glipizide 10 mg twice daily, Actos, and Januvia -patient did recently change from [place] to [place] due to insurance coverage changes Patient has not noticed any side effects with changing medicines and her blood sugars are  well controlled usually around 100 Having some hypoglycemic episodes -reviewed management of hypoglycemia Advised patient to decrease glipizide dose if having frequent episodes Recheck A1c today due to med changes but overall well controlled  Discussed at length today GLP-1 inhibitor injections as a possible medication which could help simplify her  diabetes management (would decrease or discontinue other medicines) and would possibly give her weight loss benefit - POCT glycosylated hemoglobin (Hb A1C)   6.6 today, still well controlled  2. Encounter for screening mammogram for malignant neoplasm of breast - MM 3D SCREEN BREAST BILATERAL; Future  3. Hyperlipidemia, unspecified hyperlipidemia type Compliant with Lipitor 80 mg and Zetia, recent lab work at goal and reviewed, no myalgias side effects or concerns  4. Essential hypertension, benign Stable and well-controlled, blood pressure at goal today Labs done 3 months ago - renal function and electrolytes WNL/reviewed Continue management with Norvasc 5 mg and losartan 50 mg daily and continued DASH  5. Gastroesophageal reflux disease without esophagitis Some flared up sx with mobic - using tylenol, mobic and ibuprofen for knee pain - on omeprazole - discussed NSAIDs GI SE and concerning sx, no current red flags.    6. Left knee pain, unspecified chronicity Reviewed - managed by [facility], pt still symptomatic, she has f/up next month  7. Class 2 severe obesity with serious comorbidity and body mass index (BMI) of 37.0 to 37.9 in adult, unspecified obesity type ([place]) Discussed possible GLP-1, handouts and info given May be a good option for pt   Return in about 6 months (around 01/05/2021) for Routine follow-up.   Delsa Grana, [name] 07/07/20 11:47 AM

## 2020-08-07 IMAGING — US US EXTREM LOW VENOUS*R*
1 series · 13 of 24 positions shown · non-contrast
Comparison: None.

CLINICAL DATA: 57-year-old female with tenderness behind the right
knee.



[Series 1: us extrem low venous*right* · 0.08mm/px · 13 of 40 slices shown]
[im 1/40]
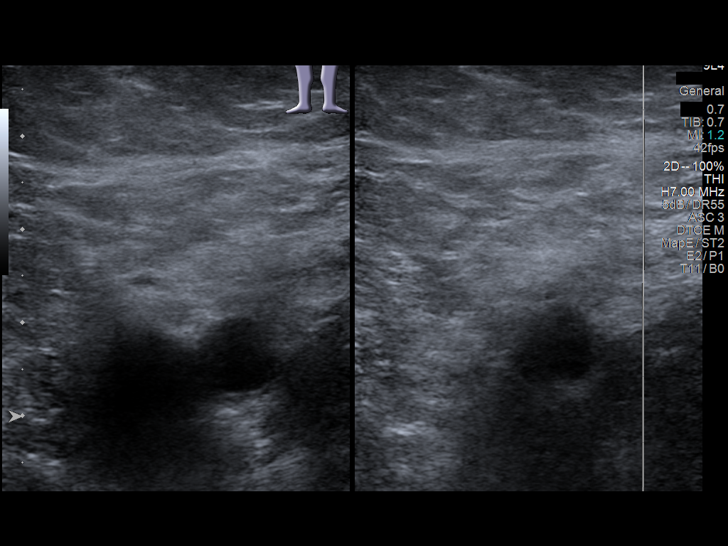
[im 4/40]
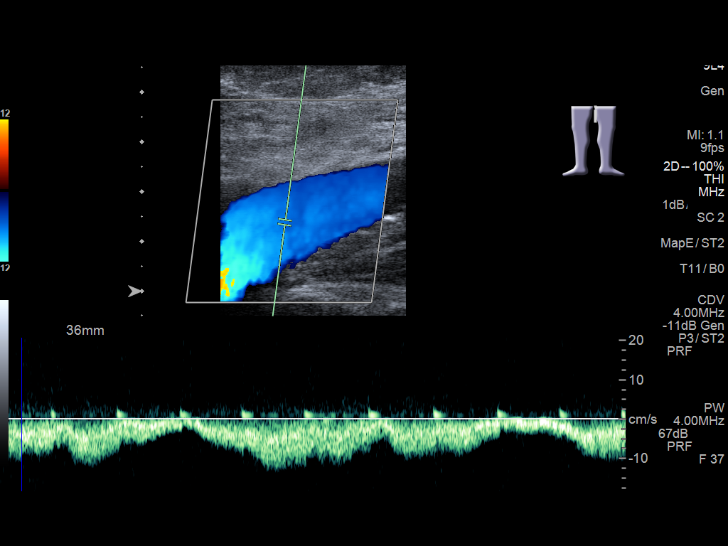
[im 7/40]
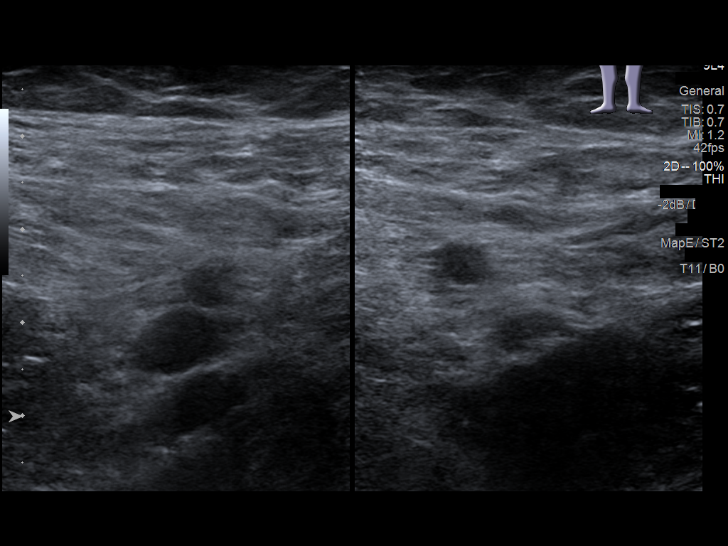
[im 11/40]
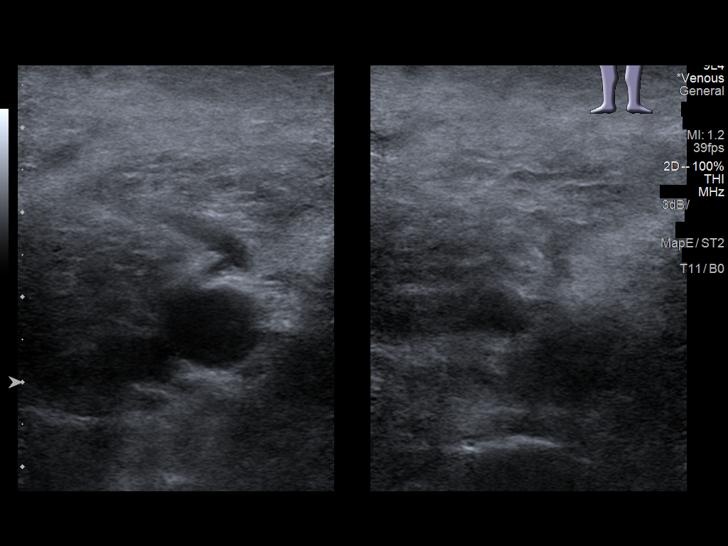
[im 14/40]
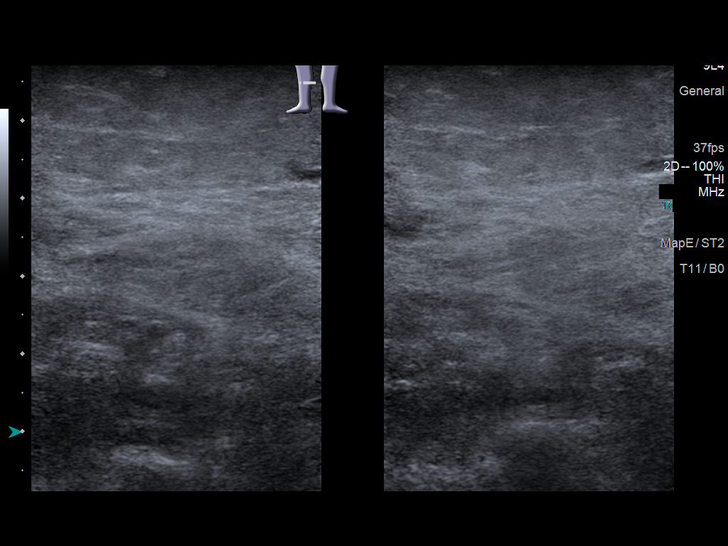
[im 17/40]
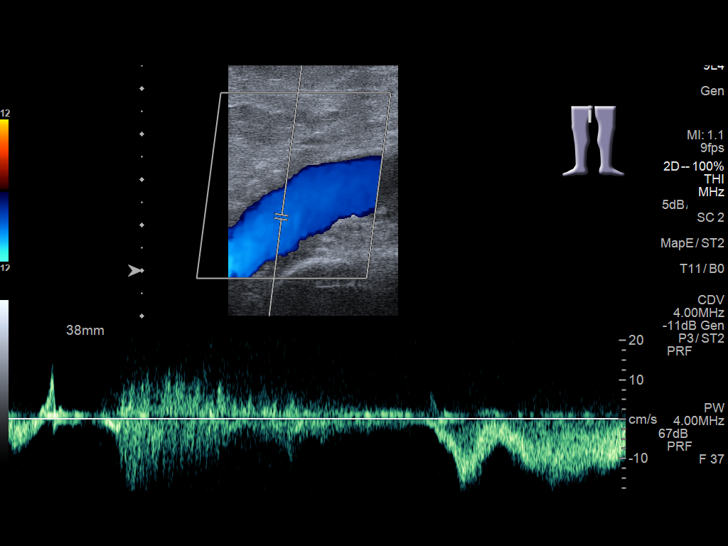
[im 21/40]
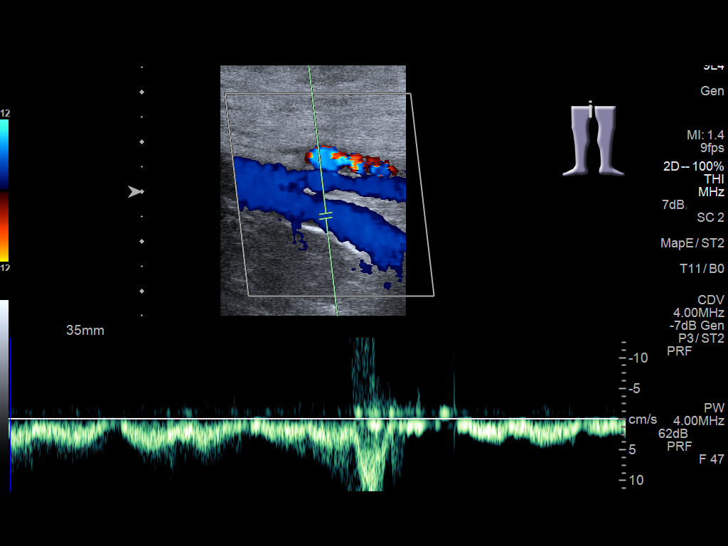
[im 23/40]
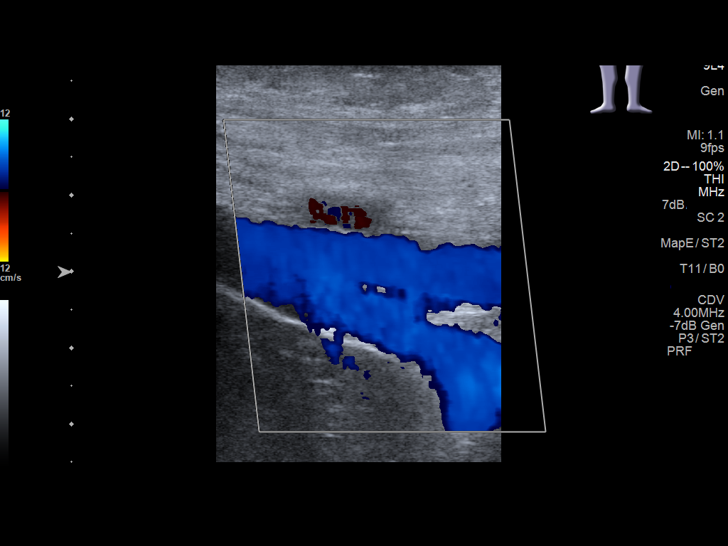
[im 26/40]
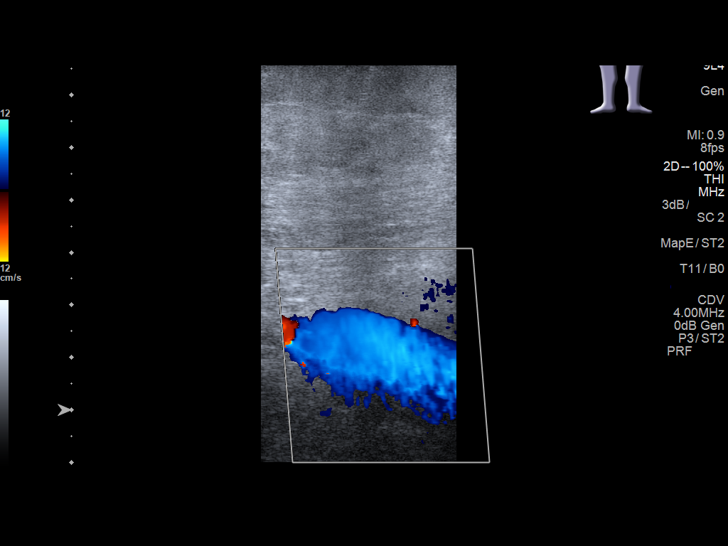
[im 29/40]
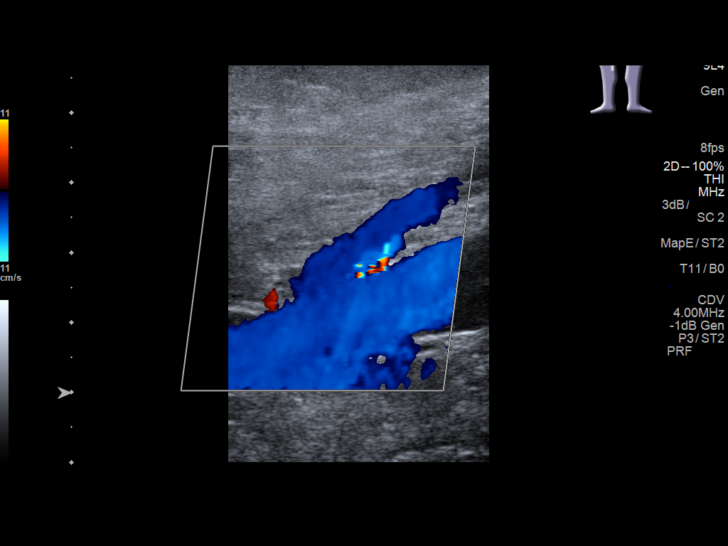
[im 33/40]
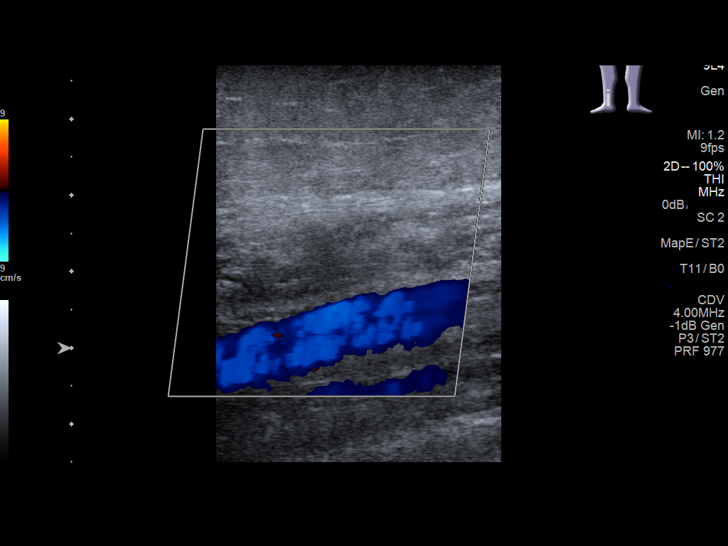
[im 36/40]
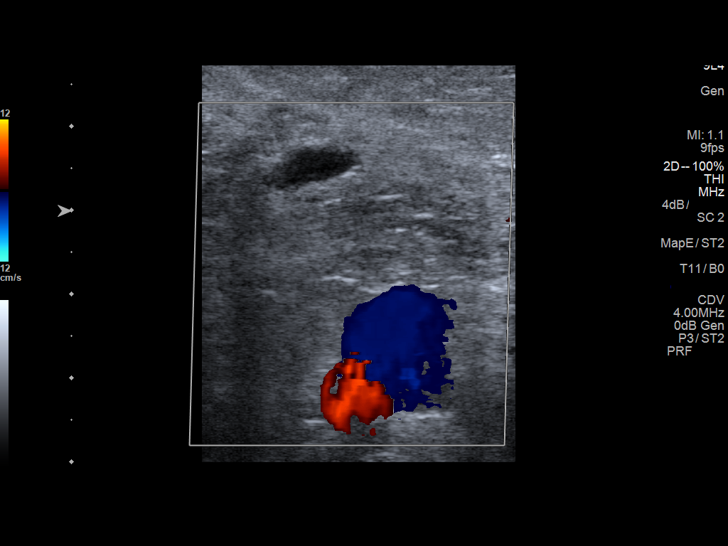
[im 40/40]
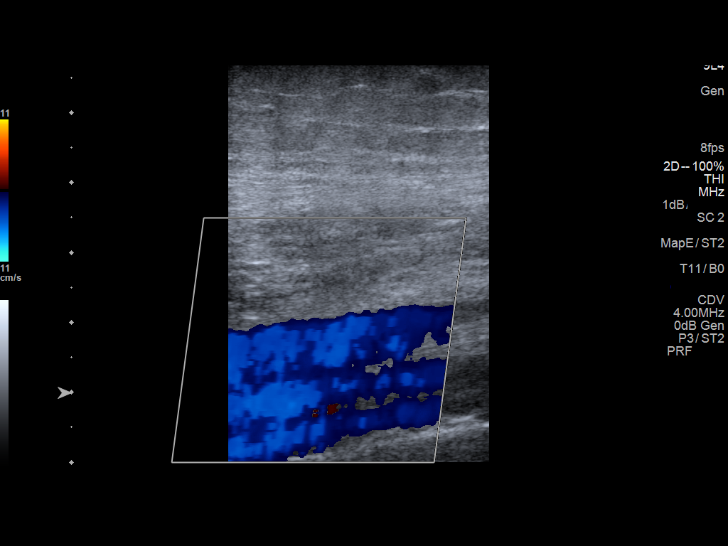

[13 of 24 positions shown; findings below may reference images not displayed]

FINDINGS: Contralateral Common Femoral Vein: Respiratory phasicity is normal
and symmetric with the symptomatic side. No evidence of thrombus.
Normal compressibility.

Common Femoral Vein: No evidence of thrombus. Normal
compressibility, respiratory phasicity and response to augmentation.

Saphenofemoral Junction: No evidence of thrombus. Normal
compressibility and flow on color Doppler imaging.

Profunda Femoral Vein: No evidence of thrombus. Normal
compressibility and flow on color Doppler imaging.

Femoral Vein: No evidence of thrombus. Normal compressibility,
respiratory phasicity and response to augmentation.

Popliteal Vein: No evidence of thrombus. Normal compressibility,
respiratory phasicity and response to augmentation.

Calf Veins: No evidence of thrombus. Normal compressibility and flow
on color Doppler imaging.

Superficial Great Saphenous Vein: No evidence of thrombus. Normal
compressibility.

Venous Reflux:  None.

Other Findings: There is a 2.4 x 0.4 x 1.1 cm cystic structure in
the right popliteal fossa likely a Baker's cyst.
IMPRESSION: 1. No evidence of deep venous thrombosis.
2. Probable Baker's cyst posterior to the right knee.

## 2020-09-04 ENCOUNTER — Other Ambulatory Visit: Payer: Self-pay | Admitting: Family Medicine

## 2020-09-04 DIAGNOSIS — E119 Type 2 diabetes mellitus without complications: Secondary | ICD-10-CM

## 2020-10-02 ENCOUNTER — Other Ambulatory Visit: Payer: Self-pay | Admitting: Family Medicine

## 2020-10-02 DIAGNOSIS — E119 Type 2 diabetes mellitus without complications: Secondary | ICD-10-CM

## 2020-12-27 ENCOUNTER — Other Ambulatory Visit: Payer: Self-pay | Admitting: Family Medicine

## 2020-12-27 DIAGNOSIS — I10 Essential (primary) hypertension: Secondary | ICD-10-CM

## 2021-01-03 ENCOUNTER — Ambulatory Visit: Payer: BC Managed Care – PPO | Admitting: Family Medicine

## 2021-02-15 ENCOUNTER — Ambulatory Visit: Payer: BC Managed Care – PPO | Admitting: Unknown Physician Specialty

## 2021-02-28 ENCOUNTER — Other Ambulatory Visit: Payer: Self-pay

## 2021-02-28 ENCOUNTER — Ambulatory Visit (INDEPENDENT_AMBULATORY_CARE_PROVIDER_SITE_OTHER): Payer: BC Managed Care – PPO | Admitting: Family Medicine

## 2021-02-28 ENCOUNTER — Encounter: Payer: Self-pay | Admitting: Family Medicine

## 2021-02-28 VITALS — BP 138/84 | HR 83 | Temp 98.4°F | Resp 16 | Ht 63.0 in | Wt 196.3 lb

## 2021-02-28 DIAGNOSIS — Z5181 Encounter for therapeutic drug level monitoring: Secondary | ICD-10-CM

## 2021-02-28 DIAGNOSIS — E669 Obesity, unspecified: Secondary | ICD-10-CM

## 2021-02-28 DIAGNOSIS — E785 Hyperlipidemia, unspecified: Secondary | ICD-10-CM

## 2021-02-28 DIAGNOSIS — M79606 Pain in leg, unspecified: Secondary | ICD-10-CM

## 2021-02-28 DIAGNOSIS — B948 Sequelae of other specified infectious and parasitic diseases: Secondary | ICD-10-CM

## 2021-02-28 DIAGNOSIS — I10 Essential (primary) hypertension: Secondary | ICD-10-CM | POA: Diagnosis not present

## 2021-02-28 DIAGNOSIS — R5383 Other fatigue: Secondary | ICD-10-CM

## 2021-02-28 DIAGNOSIS — R718 Other abnormality of red blood cells: Secondary | ICD-10-CM

## 2021-02-28 DIAGNOSIS — E119 Type 2 diabetes mellitus without complications: Secondary | ICD-10-CM | POA: Diagnosis not present

## 2021-02-28 DIAGNOSIS — Z23 Encounter for immunization: Secondary | ICD-10-CM

## 2021-02-28 DIAGNOSIS — D582 Other hemoglobinopathies: Secondary | ICD-10-CM

## 2021-02-28 DIAGNOSIS — K219 Gastro-esophageal reflux disease without esophagitis: Secondary | ICD-10-CM

## 2021-02-28 DIAGNOSIS — U099 Post covid-19 condition, unspecified: Secondary | ICD-10-CM

## 2021-02-28 DIAGNOSIS — Z6834 Body mass index (BMI) 34.0-34.9, adult: Secondary | ICD-10-CM

## 2021-02-28 DIAGNOSIS — Z1211 Encounter for screening for malignant neoplasm of colon: Secondary | ICD-10-CM

## 2021-02-28 MED ORDER — EZETIMIBE 10 MG PO TABS: 10.0000 mg | ORAL_TABLET | Freq: Every day | ORAL | 3 refills | Status: DC

## 2021-02-28 MED ORDER — PIOGLITAZONE HCL 15 MG PO TABS: 15.0000 mg | ORAL_TABLET | Freq: Every day | ORAL | 3 refills | Status: DC

## 2021-02-28 MED ORDER — AMLODIPINE BESYLATE 5 MG PO TABS: 5.0000 mg | ORAL_TABLET | Freq: Every day | ORAL | 3 refills | Status: DC

## 2021-02-28 MED ORDER — SITAGLIPTIN PHOSPHATE 25 MG PO TABS: 25.0000 mg | ORAL_TABLET | Freq: Every day | ORAL | 3 refills | Status: DC

## 2021-02-28 MED ORDER — LOSARTAN POTASSIUM 50 MG PO TABS: 50.0000 mg | ORAL_TABLET | Freq: Every day | ORAL | 3 refills | Status: DC

## 2021-02-28 MED ORDER — ATORVASTATIN CALCIUM 80 MG PO TABS: 80.0000 mg | ORAL_TABLET | Freq: Every day | ORAL | 3 refills | Status: DC

## 2021-02-28 NOTE — Progress Notes (Signed)
Name: Elizabeth Scott   MRN: [id number]    DOB: [birth year]   Date:02/28/2021       Progress Note  Chief Complaint  Patient presents with   Diabetes   Hyperlipidemia   Hypertension     Subjective:   Elizabeth Scott is a 60 y.o. female, presents to clinic for routine f/up  DM:   Pt managing DM with glipizide, januvia, actos, cannot tolerate metformin - med changes in the last 1-2 years due to insurance coverage changes - overall well controlled Reports good med compliance Pt has no SE from meds. Blood sugars 118 - not checking often Denies: Polyuria, polydipsia, vision changes, neuropathy, hypoglycemia - she has had a few low episdoes Recent pertinent labs: Lab Results  Component Value Date   HGBA1C 6.6 (A) 07/07/2020   HGBA1C 6.1 (H) 04/07/2020   HGBA1C 6.5 (H) 12/30/2019   Standard of care and health maintenance: Foot exam:  due DM eye exam:  due ACEI/ARB:  losartan Statin:  yes  Hypertension:  Currently managed on losartan and amlodipine Pt reports good med compliance and denies any SE.   Blood pressure today is fairly well controlled. BP Readings from Last 3 Encounters:  02/28/21 136/84  07/07/20 126/82  03/26/20 138/86   Pt denies CP, SOB, exertional sx, LE edema, palpitation, Ha's, visual disturbances, lightheadedness, hypotension, syncope. Dietary efforts for BP?  Working on health and weight loss  Hyperlipidemia: Currently treated with lipitor 80 and zetia 10, pt reports good med compliance Last Lipids: Lab Results  Component Value Date   CHOL 126 12/30/2019   HDL 42 (L) 12/30/2019   LDLCALC 68 12/30/2019   TRIG 78 12/30/2019   CHOLHDL 3.0 12/30/2019   - Denies: Chest pain, shortness of breath, myalgias, claudication  Obesity -  She has lost weight- almost 20 lbs since last OV Wt Readings from Last 5 Encounters:  02/28/21 196 lb 4.8 oz (89 kg)  07/07/20 214 lb 4.8 oz (97.2 kg)  03/26/20 209 lb 1.6 oz (94.8 kg)  02/11/20 212 lb 11.2  oz (96.5 kg)  12/30/19 222 lb 9.6 oz (101 kg)   BMI Readings from Last 5 Encounters:  02/28/21 34.77 kg/m  07/07/20 37.96 kg/m  03/26/20 37.04 kg/m  02/11/20 37.68 kg/m  12/30/19 39.43 kg/m   GERD - on prilosec in the past - needing something most days  Throbbing to right leg anterior from knee to ankle, sometimes alternates legs - working on her feet 10-12 hour days 6 d a week - increased job work/stress and demand for the past year, hurt more on work days, Engineer, materials like a tooth ache - she just went on vacation they didn't hurt at all - so she thinks may be related to work No swelling to legs knees or ankles, shes tried tylenol  Fatigue - generalized fatigue fairly severe since COVID illness with respiratory sx, also more generalized fatigue with working more, more stress.  Difficulty getting to sleep, on average sleeps 6 hours a night. Depression screen [facility] 2/9 02/28/2021 07/07/2020 03/26/2020  Decreased Interest 0 0 0  Down, Depressed, Hopeless 0 0 0  PHQ - 2 Score 0 0 0  Altered sleeping 0 - 1  Tired, decreased energy 0 - 1  Change in appetite 0 - 0  Feeling bad or failure about yourself  0 - 0  Trouble concentrating 0 - 0  Moving slowly or fidgety/restless 0 - 0  Suicidal thoughts 0 - 0  PHQ-9 Score 0 -  2  Difficult doing work/chores Not difficult at all - Not difficult at all  Some recent data might be hidden        Current Outpatient Medications:    albuterol (PROVENTIL HFA;VENTOLIN HFA) 108 (90 BASE) MCG/ACT inhaler, Inhale 2 puffs into the lungs every 2 (two) hours as needed for wheezing or shortness of breath., Disp: , Rfl:    amLODipine (NORVASC) 5 MG tablet, TAKE 1 TABLET BY MOUTH EVERY DAY, Disp: 90 tablet, Rfl: 0   aspirin 81 MG tablet, Take 81 mg by mouth daily., Disp: , Rfl:    atorvastatin (LIPITOR) 80 MG tablet, Take 1 tablet (80 mg total) by mouth daily., Disp: 90 tablet, Rfl: 3   blood glucose meter kit and supplies KIT, Dispense based on patient and  insurance preference. Use up to four times daily as directed. (FOR ICD-9 250.00, 250.01)., Disp: 1 each, Rfl: 0   cholecalciferol (VITAMIN D3) 25 MCG (1000 UT) tablet, Take 1,000 Units by mouth daily., Disp: , Rfl:    ezetimibe (ZETIA) 10 MG tablet, Take 1 tablet (10 mg total) by mouth daily., Disp: 90 tablet, Rfl: 3   glipiZIDE (GLUCOTROL XL) 10 MG 24 hr tablet, Take 1 tablet (10 mg total) by mouth 2 (two) times daily., Disp: 180 tablet, Rfl: 3   losartan (COZAAR) 50 MG tablet, Take 1 tablet (50 mg total) by mouth daily., Disp: 90 tablet, Rfl: 3   meloxicam (MOBIC) 7.5 MG tablet, Take 7.5 mg by mouth daily., Disp: , Rfl:    omeprazole (PRILOSEC) 20 MG capsule, Take 20 mg by mouth daily., Disp: , Rfl:    pioglitazone (ACTOS) 15 MG tablet, TAKE 1 TABLET BY MOUTH EVERY DAY, Disp: 90 tablet, Rfl: 1   sitaGLIPtin (JANUVIA) 25 MG tablet, Take 1 tablet (25 mg total) by mouth daily., Disp: 90 tablet, Rfl: 3  Patient Active Problem List   Diagnosis Date Noted   Paronychia of great toe, right 02/11/2020   Antibiotic-induced yeast infection 02/11/2020   Uncontrolled type 2 diabetes mellitus with hyperglycemia, without long-term current use of insulin ([place]) 09/25/2018   Anemia, iron deficiency 10/17/2017   Breast cancer screening 09/08/2016   Left shoulder pain 09/08/2016   Encounter for medication monitoring 03/31/2016   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   [place] allergy 08/30/2015   Preventative health care 08/30/2015   Type 2 diabetes mellitus without complication, without long-term current use of insulin ([place]) 04/27/2015   Hyperlipidemia 04/27/2015   Essential hypertension, benign 04/27/2015   Microcytosis 04/27/2015   Class 2 severe obesity with serious comorbidity and body mass index (BMI) of 37.0 to 37.9 in adult ([place]) 04/27/2015   Gastroesophageal reflux disease without esophagitis 01/08/2015    Past Surgical History:  Procedure Laterality Date   ABDOMINAL HYSTERECTOMY  2003   still  has ovaries-for menorrhagia   CESAREAN SECTION     COLONOSCOPY  02/22/14   repeat 5 years   ESOPHAGOGASTRODUODENOSCOPY  02/22/14   TUBAL LIGATION      Family History  Problem Relation Age of Onset   Diabetes Mother    Heart disease Mother    Hypertension Mother    Stroke Mother    Diabetes Father    Hypertension Father    Asthma Sister    Diabetes Sister    Hypertension Sister    Lung disease Sister    Hypertension Brother    Heart disease Brother    Stroke Brother    Asthma Sister    Diabetes Sister  Hypertension Sister    Congestive Heart Failure Maternal Grandmother    Cancer Paternal Grandfather        stomach cancer   COPD Neg Hx     Social History   Tobacco Use   Smoking status: Never   Smokeless tobacco: Never  Vaping Use   Vaping Use: Never used  Substance Use Topics   Alcohol use: No   Drug use: No     Allergies  Allergen Reactions   Metformin And Related Other (See Comments)    Rash, Thrush    Other     walnuts    Health Maintenance  Topic Date Due   MAMMOGRAM  09/10/2012   Pneumococcal Vaccine 69-30 Years old (2 - PCV) 12/04/2012   COLONOSCOPY (Pts 45-92yr Insurance coverage will need to be confirmed)  02/23/2019   COVID-19 Vaccine (3 - Moderna risk series) 09/25/2019   PAP SMEAR-Modifier  10/17/2020   FOOT EXAM  12/29/2020   HEMOGLOBIN A1C  01/05/2021   INFLUENZA VACCINE  02/28/2021   OPHTHALMOLOGY EXAM  02/28/2021 (Originally 10/29/2017)   Zoster Vaccines- Shingrix (1 of 2) 05/31/2021 (Originally [birth year]1981)   TETANUS/TDAP  06/11/2021   PNEUMOCOCCAL POLYSACCHARIDE VACCINE AGE 42-64 HIGH RISK  Completed   Hepatitis C Screening  Completed   HIV Screening  Completed   HPV VACCINES  Aged Out    Chart Review Today: I personally reviewed active problem list, medication list, allergies, family history, social history, health maintenance, notes from last encounter, lab results, imaging with the patient/caregiver today.   Review of Systems   Constitutional: Negative.   HENT: Negative.    Eyes: Negative.   Respiratory: Negative.    Cardiovascular: Negative.   Gastrointestinal: Negative.   Endocrine: Negative.   Genitourinary: Negative.   Musculoskeletal: Negative.   Skin: Negative.   Allergic/Immunologic: Negative.   Neurological: Negative.   Hematological: Negative.   Psychiatric/Behavioral: Negative.    All other systems reviewed and are negative.   Objective:   Vitals:   02/28/21 1031  BP: 136/84  Pulse: 83  Resp: 16  Temp: 98.4 F (36.9 C)  SpO2: 98%  Weight: 196 lb 4.8 oz (89 kg)  Height: 5' 3"  (1.6 m)    Body mass index is 34.77 kg/m.  Physical Exam Vitals and nursing note reviewed.  Constitutional:      General: She is not in acute distress.    Appearance: Normal appearance. She is well-developed. She is obese. She is not ill-appearing, toxic-appearing or diaphoretic.     Interventions: Face mask in place.  HENT:     Head: Normocephalic and atraumatic.     Right Ear: External ear normal.     Left Ear: External ear normal.  Eyes:     General: Lids are normal. No scleral icterus.       Right eye: No discharge.        Left eye: No discharge.     Conjunctiva/sclera: Conjunctivae normal.  Neck:     Trachea: Phonation normal. No tracheal deviation.  Cardiovascular:     Rate and Rhythm: Normal rate and regular rhythm.     Pulses: Normal pulses.          Radial pulses are 2+ on the right side and 2+ on the left side.       Posterior tibial pulses are 2+ on the right side and 2+ on the left side.     Heart sounds: Normal heart sounds. No murmur heard.   No friction rub.  No gallop.  Pulmonary:     Effort: Pulmonary effort is normal. No respiratory distress.     Breath sounds: Normal breath sounds. No stridor. No wheezing, rhonchi or rales.  Chest:     Chest wall: No tenderness.  Abdominal:     General: Bowel sounds are normal. There is no distension.     Palpations: Abdomen is soft.   Musculoskeletal:        General: No swelling, tenderness or deformity.     Right lower leg: No edema.     Left lower leg: No edema.     Comments: See dm foot exam  Skin:    General: Skin is warm and dry.     Capillary Refill: Capillary refill takes less than 2 seconds.     Coloration: Skin is not jaundiced or pale.     Findings: No rash.  Neurological:     Mental Status: She is alert. Mental status is at baseline.     Motor: No abnormal muscle tone.     Gait: Gait normal.  Psychiatric:        Mood and Affect: Mood normal.        Speech: Speech normal.        Behavior: Behavior normal.    Diabetic Foot Exam - Simple   Simple Foot Form Diabetic Foot exam was performed with the following findings: Yes 02/28/2021 10:46 AM  Visual Inspection No deformities, no ulcerations, no other skin breakdown bilaterally: Yes Sensation Testing Intact to touch and monofilament testing bilaterally: Yes Pulse Check Posterior Tibialis and Dorsalis pulse intact bilaterally: Yes Comments        Assessment & Plan:     ICD-10-CM   1. Essential hypertension, benign  I10 amLODipine (NORVASC) 5 MG tablet    losartan (COZAAR) 50 MG tablet    COMPLETE METABOLIC PANEL WITH GFR   Fairly well-controlled if BP goes over 140/90 would increase losartan    2. Type 2 diabetes mellitus without complication, without long-term current use of insulin (H[facility])  E11.9 Pneumococcal conjugate vaccine 20-valent (Prevnar 20)    pioglitazone (ACTOS) 15 MG tablet    sitaGLIPtin (JANUVIA) 25 MG tablet    losartan (COZAAR) 50 MG tablet    atorvastatin (LIPITOR) 80 MG tablet    Hemoglobin A1C    COMPLETE METABOLIC PANEL WITH GFR    Ambulatory referral to Ophthalmology   Well-controlled on multiple meds possibly some hypoglycemia plan to check A1c and decrease and change glipizide if DM well controlled    3. Hyperlipidemia, unspecified hyperlipidemia type  E78.5 ezetimibe (ZETIA) 10 MG tablet    atorvastatin (LIPITOR)  80 MG tablet    Lipid panel    COMPLETE METABOLIC PANEL WITH GFR   Has been stable and well-controlled on statin and Zetia    4. Class 1 obesity with serious comorbidity and body mass index (BMI) of 34.0 to 34.9 in adult, unspecified obesity type Chronic E66.9 Hemoglobin A1C   Z68.34 Lipid panel    COMPLETE METABOLIC PANEL WITH GFR   She is losing weight mostly because of working so much and not eating as much    5. 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 Microcytosis  R71.8 CBC with Differential/Platelet   See above, no iron deficiency, previously ferritin was elevated    7. Need for pneumococcal vaccination  Z23 Pneumococcal conjugate vaccine 20-valent (Prevnar 20)  8. Screening for colon cancer  Z12.11 Ambulatory referral to Gastroenterology   Insurance changes she will check with her insurance and let us know where to place referral    9. Encounter for medication monitoring  Z51.81 Hemoglobin A1C    Lipid panel    COMPLETE METABOLIC PANEL WITH GFR    CBC with Differential/Platelet    10. Fatigue, unspecified type  R53.83 CBC with Differential/Platelet    TSH   Generalized, ongoing for over a year, screen for hypothyroid, may be long COVID, or related to prolonged stress and adjustment disorder    11. Multiple persistent symptoms after COVID-19  B94.8 CBC with Differential/Platelet    TSH   generalized fatigue and some worse than baseline respiratory sx    12. Pain of anterior lower extremity, unspecified laterality  M79.606 meloxicam (MOBIC) 7.5 MG tablet    COMPLETE METABOLIC PANEL WITH GFR    CBC with Differential/Platelet   trial of compression stockings and may also try OTC b12 and/or mag supplements    13. Gastroesophageal reflux disease, unspecified whether esophagitis present  K21.9 omeprazole (PRILOSEC) 20 MG capsule     We discussed possibly  trying an SSRI for her chronic fatigue symptoms discussed today may be related to prolonged exposure to increased stress and increased work demand?  Invited patient to return to review labs and discuss further work-up and management.  Could also rule out obstructive sleep apnea?  Return in about 6 months (around 08/31/2021) for CPE   (sooner as needed for fatigue sx for further work up).   Delsa Grana, [name] 02/28/21 10:38 AM

## 2021-02-28 NOTE — Patient Instructions (Addendum)
Please come back to discuss fatigue more if you want to try and work that up further.  We can review your labs and discuss other work up and or treatment   Fatigue If you have fatigue, you feel tired all the time and have a lack of energy or a lack of motivation. Fatigue may make it difficult to start or complete tasks because of exhaustion. In general, occasional or mild fatigue is often a normal response to activity or life. However, long-lasting (chronic) or extreme fatigue may be a symptom of a medical condition. Follow these instructions at home: General instructions Watch your fatigue for any changes. Go to bed and get up at the same time every day. Avoid fatigue by pacing yourself during the day and getting enough sleep at night. Maintain a healthy weight. Medicines Take over-the-counter and prescription medicines only as told by your health care provider. Take a multivitamin, if told by your health care provider.  Do not use herbal or dietary supplements unless they are approved by your health care provider. Activity  Exercise regularly, as told by your health care provider. Use or practice techniques to help you relax, such as yoga, tai chi, meditation, or massage therapy.  Eating and drinking  Avoid heavy meals in the evening. Eat a well-balanced diet, which includes lean proteins, whole grains, plenty of fruits and vegetables, and low-fat dairy products. Avoid consuming too much caffeine. Avoid the use of alcohol. Drink enough fluid to keep your urine pale yellow.  Lifestyle Change situations that cause you stress. Try to keep your work and personal schedule in balance. Do not use any products that contain nicotine or tobacco, such as cigarettes and e-cigarettes. If you need help quitting, ask your health care provider. Do not use drugs. Contact a health care provider if: Your fatigue does not get better. You have a fever. You suddenly lose or gain weight. You have  headaches. You have trouble falling asleep or sleeping through the night. You feel angry, guilty, anxious, or sad. You are unable to have a bowel movement (constipation). Your skin is dry. You have swelling in your legs or another part of your body. Get help right away if: You feel confused. Your vision is blurry. You feel faint or you pass out. You have a severe headache. You have severe pain in your abdomen, your back, or the area between your waist and hips (pelvis). You have chest pain, shortness of breath, or an irregular or fast heartbeat. You are unable to urinate, or you urinate less than normal. You have abnormal bleeding, such as bleeding from the rectum, vagina, nose, lungs, or nipples. You vomit blood. You have thoughts about hurting yourself or others. If you ever feel like you may hurt yourself or others, or have thoughts about taking your own life, get help right away. You can go to your nearest emergency department or call: Your local emergency services (911 in the [place] A suicide crisis helpline, such as the [place] at [phone]. This is open 24 hours a day. Summary If you have fatigue, you feel tired all the time and have a lack of energy or a lack of motivation. Fatigue may make it difficult to start or complete tasks because of exhaustion. Long-lasting (chronic) or extreme fatigue may be a symptom of a medical condition. Exercise regularly, as told by your health care provider. Change situations that cause you stress. Try to keep your work and personal schedule in balance. This  information is not intended to replace advice given to you by your health care provider. Make sure you discuss any questions you have with your healthcare provider. Document Revised: 05/27/2020 Document Reviewed: 05/27/2020 Elsevier Patient Education  2022 [employer].

## 2021-03-01 LAB — CBC WITH DIFFERENTIAL/PLATELET
Absolute Monocytes: 419 cells/uL (ref 200–950)
Basophils Absolute: 71 cells/uL (ref 0–200)
Basophils Relative: 1 %
Eosinophils Absolute: 92 cells/uL (ref 15–500)
Eosinophils Relative: 1.3 %
HCT: 37.5 % (ref 35.0–45.0)
Hemoglobin: 12.4 g/dL (ref 11.7–15.5)
Lymphs Abs: 1406 cells/uL (ref 850–3900)
MCH: 25.2 pg — ABNORMAL LOW (ref 27.0–33.0)
MCHC: 33.1 g/dL (ref 32.0–36.0)
MCV: 76.2 fL — ABNORMAL LOW (ref 80.0–100.0)
MPV: 12.1 fL (ref 7.5–12.5)
Monocytes Relative: 5.9 %
Neutro Abs: 5112 cells/uL (ref 1500–7800)
Neutrophils Relative %: 72 %
Platelets: 258 10*3/uL (ref 140–400)
RBC: 4.92 10*6/uL (ref 3.80–5.10)
RDW: 16.7 % — ABNORMAL HIGH (ref 11.0–15.0)
Total Lymphocyte: 19.8 %
WBC: 7.1 10*3/uL (ref 3.8–10.8)

## 2021-03-01 LAB — COMPLETE METABOLIC PANEL WITH GFR
AG Ratio: 1.9 (calc) (ref 1.0–2.5)
ALT: 16 U/L (ref 6–29)
AST: 16 U/L (ref 10–35)
Albumin: 4.1 g/dL (ref 3.6–5.1)
Alkaline phosphatase (APISO): 70 U/L (ref 37–153)
BUN: 12 mg/dL (ref 7–25)
CO2: 29 mmol/L (ref 20–32)
Calcium: 9 mg/dL (ref 8.6–10.4)
Chloride: 104 mmol/L (ref 98–110)
Creat: 0.55 mg/dL (ref 0.50–1.03)
Globulin: 2.2 g/dL (calc) (ref 1.9–3.7)
Glucose, Bld: 107 mg/dL — ABNORMAL HIGH (ref 65–99)
Potassium: 3.8 mmol/L (ref 3.5–5.3)
Sodium: 140 mmol/L (ref 135–146)
Total Bilirubin: 0.7 mg/dL (ref 0.2–1.2)
Total Protein: 6.3 g/dL (ref 6.1–8.1)
eGFR: 106 mL/min/{1.73_m2} (ref >=60)

## 2021-03-01 LAB — LIPID PANEL
Cholesterol: 164 mg/dL (ref <200)
HDL: 48 mg/dL — ABNORMAL LOW (ref >=50)
LDL Cholesterol (Calc): 96 mg/dL (calc)
Non-HDL Cholesterol (Calc): 116 mg/dL (calc) (ref <130)
Total CHOL/HDL Ratio: 3.4 (calc) (ref <5.0)
Triglycerides: 105 mg/dL (ref <150)

## 2021-03-01 LAB — HEMOGLOBIN A1C
Hgb A1c MFr Bld: 6.2 % of total Hgb — ABNORMAL HIGH (ref <5.7)
Mean Plasma Glucose: 131 mg/dL
eAG (mmol/L): 7.3 mmol/L

## 2021-03-01 LAB — TSH: TSH: 0.65 mIU/L (ref 0.40–4.50)

## 2021-03-14 ENCOUNTER — Other Ambulatory Visit: Payer: Self-pay

## 2021-03-14 ENCOUNTER — Telehealth (INDEPENDENT_AMBULATORY_CARE_PROVIDER_SITE_OTHER): Payer: Self-pay | Admitting: Gastroenterology

## 2021-03-14 DIAGNOSIS — Z1211 Encounter for screening for malignant neoplasm of colon: Secondary | ICD-10-CM

## 2021-03-14 NOTE — Progress Notes (Signed)
Gastroenterology Pre-Procedure Review  Request Date: 07/05/21 Requesting Physician: Dr. Allen Norris  PATIENT REVIEW QUESTIONS: The patient responded to the following health history questions as indicated:    1. Are you having any GI issues? no 2. Do you have a personal history of Polyps? yes (5 years ago) 3. Do you have a family history of Colon Cancer or Polyps? no 4. Diabetes Mellitus? yes (Type II) 5. Joint replacements in the past 12 months?no 6. Major health problems in the past 3 months?no 7. Any artificial heart valves, MVP, or defibrillator?no    MEDICATIONS & ALLERGIES:    Patient reports the following regarding taking any anticoagulation/antiplatelet therapy:   Plavix, Coumadin, Eliquis, Xarelto, Lovenox, Pradaxa, Brilinta, or Effient? no Aspirin? yes (81 mg)  Patient confirms/reports the following medications:  Current Outpatient Medications  Medication Sig Dispense Refill   albuterol (PROVENTIL HFA;VENTOLIN HFA) 108 (90 BASE) MCG/ACT inhaler Inhale 2 puffs into the lungs every 2 (two) hours as needed for wheezing or shortness of breath.     amLODipine (NORVASC) 5 MG tablet Take 1 tablet (5 mg total) by mouth daily. 90 tablet 3   aspirin 81 MG tablet Take 81 mg by mouth daily.     atorvastatin (LIPITOR) 80 MG tablet Take 1 tablet (80 mg total) by mouth daily. 90 tablet 3   blood glucose meter kit and supplies KIT Dispense based on patient and insurance preference. Use up to four times daily as directed. (FOR ICD-9 250.00, 250.01). 1 each 0   cholecalciferol (VITAMIN D3) 25 MCG (1000 UT) tablet Take 1,000 Units by mouth daily.     ezetimibe (ZETIA) 10 MG tablet Take 1 tablet (10 mg total) by mouth daily. 90 tablet 3   glipiZIDE (GLUCOTROL XL) 10 MG 24 hr tablet Take 1 tablet (10 mg total) by mouth 2 (two) times daily. 180 tablet 3   losartan (COZAAR) 50 MG tablet Take 1 tablet (50 mg total) by mouth daily. 90 tablet 3   meloxicam (MOBIC) 7.5 MG tablet Take 7.5 mg by mouth daily.      omeprazole (PRILOSEC) 20 MG capsule Take 20 mg by mouth daily.     pioglitazone (ACTOS) 15 MG tablet Take 1 tablet (15 mg total) by mouth daily. 90 tablet 3   sitaGLIPtin (JANUVIA) 25 MG tablet Take 1 tablet (25 mg total) by mouth daily. 90 tablet 3   No current facility-administered medications for this visit.    Patient confirms/reports the following allergies:  Allergies  Allergen Reactions   Metformin And Related Other (See Comments)    Rash, Thrush    Other     walnuts    No orders of the defined types were placed in this encounter.   AUTHORIZATION INFORMATION Primary Insurance: 1D#: Group #:  Secondary Insurance: 1D#: Group #:  SCHEDULE INFORMATION: Date: 07/05/21 Time: Location: [facility]

## 2021-07-04 ENCOUNTER — Encounter: Payer: Self-pay | Admitting: Gastroenterology

## 2021-07-05 ENCOUNTER — Ambulatory Visit
Admission: RE | Admit: 2021-07-05 | Payer: BC Managed Care – PPO | Source: Home / Self Care | Admitting: Gastroenterology

## 2021-07-05 ENCOUNTER — Encounter: Admission: RE | Payer: Self-pay | Source: Home / Self Care

## 2021-07-05 SURGERY — COLONOSCOPY WITH PROPOFOL
Anesthesia: General

## 2021-07-06 ENCOUNTER — Other Ambulatory Visit: Payer: Self-pay | Admitting: Family Medicine

## 2021-07-06 DIAGNOSIS — E119 Type 2 diabetes mellitus without complications: Secondary | ICD-10-CM

## 2021-08-03 ENCOUNTER — Ambulatory Visit (INDEPENDENT_AMBULATORY_CARE_PROVIDER_SITE_OTHER): Payer: BC Managed Care – PPO | Admitting: Nurse Practitioner

## 2021-08-03 ENCOUNTER — Encounter: Payer: Self-pay | Admitting: Nurse Practitioner

## 2021-08-03 VITALS — BP 142/70 | HR 87 | Temp 97.7°F | Resp 16 | Ht 63.0 in | Wt 203.5 lb

## 2021-08-03 DIAGNOSIS — N6321 Unspecified lump in the left breast, upper outer quadrant: Secondary | ICD-10-CM | POA: Diagnosis not present

## 2021-08-03 DIAGNOSIS — M25512 Pain in left shoulder: Secondary | ICD-10-CM

## 2021-08-03 DIAGNOSIS — M25511 Pain in right shoulder: Secondary | ICD-10-CM

## 2021-08-03 DIAGNOSIS — G8929 Other chronic pain: Secondary | ICD-10-CM

## 2021-08-03 MED ORDER — MELOXICAM 7.5 MG PO TABS: 7.5000 mg | ORAL_TABLET | Freq: Every day | ORAL | 1 refills | Status: DC

## 2021-08-03 NOTE — Progress Notes (Signed)
There were no vitals taken for this visit.   Subjective:    Patient ID: Elizabeth Scott, female    DOB: [birth year], 61 y.o.   MRN: [id number]  HPI: Elizabeth Scott is a 61 y.o. female, here alone  Chief Complaint  Patient presents with   Breast Problem   Left Breast lump:  She says she has noticed that her left breast has gotten larger compared to the right.  She denies any pain, redness, changes in skin, or nipple discharge. There is a lump on the left breast just above the nipple at 2 o'clock.  Will get diagnostic mammogram and ultrasound.   Bilateral shoulder pain:  She says that she has had bilateral shoulder pain for a long time.  She says the pain comes and goes.  She describes the pain as an achy pain.  She denies any new injury.  She says that she has taken meloxicam in the past for it and is requesting a refill for her pain.  Will send in refill.   Relevant past medical, surgical, family and social history reviewed and updated as indicated. Interim medical history since our last visit reviewed. Allergies and medications reviewed and updated.  Review of Systems  Constitutional: Negative for fever or weight change.  Respiratory: Negative for cough and shortness of breath.   Cardiovascular: Negative for chest pain or palpitations.  Gastrointestinal: Negative for abdominal pain, no bowel changes.  Musculoskeletal: Negative for gait problem or joint swelling.  Skin: Negative for rash.  Neurological: Negative for dizziness or headache.  No other specific complaints in a complete review of systems (except as listed in HPI above).      Objective:    There were no vitals taken for this visit.  Wt Readings from Last 3 Encounters:  02/28/21 196 lb 4.8 oz (89 kg)  07/07/20 214 lb 4.8 oz (97.2 kg)  03/26/20 209 lb 1.6 oz (94.8 kg)    Physical Exam  Constitutional: Patient appears well-developed and well-nourished. Obese  No distress.  HEENT: head atraumatic,  normocephalic, pupils equal and reactive to light,  neck supple Cardiovascular: Normal rate, regular rhythm and normal heart sounds.  No murmur heard. No BLE edema. Pulmonary/Chest: Effort normal and breath sounds normal. No respiratory distress. Breasts: right breast normal without mass, skin or nipple changes or axillary nodes, left breast mass noted to left upper quadrant at 2 o'clock, no skin or nipple changes Abdominal: Soft.  There is no tenderness. Psychiatric: Patient has a normal mood and affect. behavior is normal. Judgment and thought content normal.   Results for orders placed or performed in visit on 02/28/21  Hemoglobin A1C  Result Value Ref Range   Hgb A1c MFr Bld 6.2 (H) <5.7 % of total Hgb   Mean Plasma Glucose 131 mg/dL   eAG (mmol/L) 7.3 mmol/L  Lipid panel  Result Value Ref Range   Cholesterol 164 <200 mg/dL   HDL 48 (L) > OR = 50 mg/dL   Triglycerides 105 <150 mg/dL   LDL Cholesterol (Calc) 96 mg/dL (calc)   Total CHOL/HDL Ratio 3.4 <5.0 (calc)   Non-HDL Cholesterol (Calc) 116 <130 mg/dL (calc)  COMPLETE METABOLIC PANEL WITH GFR  Result Value Ref Range   Glucose, Bld 107 (H) 65 - 99 mg/dL   BUN 12 7 - 25 mg/dL   Creat 0.55 0.50 - 1.03 mg/dL   eGFR 106 > OR = 60 mL/min/1.59m   BUN/Creatinine Ratio NOT APPLICABLE 6 - 22 (calc)  Sodium 140 135 - 146 mmol/L   Potassium 3.8 3.5 - 5.3 mmol/L   Chloride 104 98 - 110 mmol/L   CO2 29 20 - 32 mmol/L   Calcium 9.0 8.6 - 10.4 mg/dL   Total Protein 6.3 6.1 - 8.1 g/dL   Albumin 4.1 3.6 - 5.1 g/dL   Globulin 2.2 1.9 - 3.7 g/dL (calc)   AG Ratio 1.9 1.0 - 2.5 (calc)   Total Bilirubin 0.7 0.2 - 1.2 mg/dL   Alkaline phosphatase (APISO) 70 37 - 153 U/L   AST 16 10 - 35 U/L   ALT 16 6 - 29 U/L  CBC with Differential/Platelet  Result Value Ref Range   WBC 7.1 3.8 - 10.8 Thousand/uL   RBC 4.92 3.80 - 5.10 Million/uL   Hemoglobin 12.4 11.7 - 15.5 g/dL   HCT 37.5 35.0 - 45.0 %   MCV 76.2 (L) 80.0 - 100.0 fL   MCH 25.2  (L) 27.0 - 33.0 pg   MCHC 33.1 32.0 - 36.0 g/dL   RDW 16.7 (H) 11.0 - 15.0 %   Platelets 258 140 - 400 Thousand/uL   MPV 12.1 7.5 - 12.5 fL   Neutro Abs 5,112 1,500 - 7,800 cells/uL   Lymphs Abs 1,406 850 - 3,900 cells/uL   Absolute Monocytes 419 200 - 950 cells/uL   Eosinophils Absolute 92 15 - 500 cells/uL   Basophils Absolute 71 0 - 200 cells/uL   Neutrophils Relative % 72 %   Total Lymphocyte 19.8 %   Monocytes Relative 5.9 %   Eosinophils Relative 1.3 %   Basophils Relative 1.0 %  TSH  Result Value Ref Range   TSH 0.65 0.40 - 4.50 mIU/L      Assessment & Plan:   1. Mass of upper outer quadrant of left breast  - MM DIAG BREAST TOMO UNI LEFT; Future - US BREAST LTD UNI LEFT INC AXILLA; Future  2. Chronic pain of both shoulders  - meloxicam (MOBIC) 7.5 MG tablet; Take 1 tablet (7.5 mg total) by mouth daily.  Dispense: 30 tablet; Refill: 1   Follow up plan: No follow-ups on file.

## 2021-08-08 ENCOUNTER — Telehealth: Payer: Self-pay

## 2021-08-08 NOTE — Telephone Encounter (Signed)
Pt requesting number to call for mammogram and ultrasound- Called [facility] and [facility] [phone]. Pt warm transferred

## 2021-08-08 NOTE — Telephone Encounter (Signed)
Patient called in says spoke with mammogram office, and she was told the order was sent incorrectly and they are sending it back. It needs a new order before she an schedule an appt with them Please call back

## 2021-08-08 NOTE — Telephone Encounter (Signed)
[place] they gave appointment for January 16 at 2:20 pm. Patient notified

## 2021-08-15 ENCOUNTER — Ambulatory Visit
Admission: RE | Admit: 2021-08-15 | Discharge: 2021-08-15 | Disposition: A | Discharge status: Home / Self Care | Payer: BC Managed Care – PPO | Source: Ambulatory Visit | Attending: Nurse Practitioner | Admitting: Nurse Practitioner

## 2021-08-15 ENCOUNTER — Other Ambulatory Visit: Payer: Self-pay

## 2021-08-15 DIAGNOSIS — N6321 Unspecified lump in the left breast, upper outer quadrant: Secondary | ICD-10-CM | POA: Diagnosis present

## 2021-08-23 LAB — HM DIABETES EYE EXAM

## 2021-09-01 ENCOUNTER — Encounter: Payer: BC Managed Care – PPO | Admitting: Family Medicine

## 2021-09-08 ENCOUNTER — Encounter: Payer: BC Managed Care – PPO | Admitting: Nurse Practitioner

## 2021-09-21 ENCOUNTER — Encounter: Payer: Self-pay | Admitting: Nurse Practitioner

## 2021-09-30 ENCOUNTER — Other Ambulatory Visit: Payer: Self-pay

## 2021-09-30 ENCOUNTER — Encounter: Payer: Self-pay | Admitting: Nurse Practitioner

## 2021-09-30 ENCOUNTER — Ambulatory Visit (INDEPENDENT_AMBULATORY_CARE_PROVIDER_SITE_OTHER): Payer: BC Managed Care – PPO | Admitting: Nurse Practitioner

## 2021-09-30 VITALS — BP 128/82 | HR 88 | Temp 98.1°F | Resp 16 | Ht 63.0 in | Wt 207.3 lb

## 2021-09-30 DIAGNOSIS — Z0000 Encounter for general adult medical examination without abnormal findings: Secondary | ICD-10-CM

## 2021-09-30 DIAGNOSIS — Z23 Encounter for immunization: Secondary | ICD-10-CM

## 2021-09-30 DIAGNOSIS — K219 Gastro-esophageal reflux disease without esophagitis: Secondary | ICD-10-CM

## 2021-09-30 DIAGNOSIS — E785 Hyperlipidemia, unspecified: Secondary | ICD-10-CM

## 2021-09-30 DIAGNOSIS — E1169 Type 2 diabetes mellitus with other specified complication: Secondary | ICD-10-CM

## 2021-09-30 DIAGNOSIS — I10 Essential (primary) hypertension: Secondary | ICD-10-CM

## 2021-09-30 NOTE — Progress Notes (Signed)
Name: Elizabeth Scott   MRN: [id number]    DOB: 16-Sep-1960   Date:09/30/2021       Progress Note  Subjective  Chief Complaint  Chief Complaint  Patient presents with   Annual Exam    HPI  Patient presents for annual CPE.  Diet: well balanced, she tries to get all the food groups Exercise: walking once a week for about 30 min. She says she will increase her walking when it gets warmer. Discussed recommendation of 150 min a week of activity  [place] Office Visit from 09/30/2021 in [facility]  AUDIT-C Score 0      Depression: Phq 9 is  negative Depression screen [facility] 2/9 09/30/2021 08/03/2021 02/28/2021 07/07/2020 03/26/2020  Decreased Interest 0 0 0 0 0  Down, Depressed, Hopeless 0 0 0 0 0  PHQ - 2 Score 0 0 0 0 0  Altered sleeping 0 0 0 - 1  Tired, decreased energy 0 0 0 - 1  Change in appetite 0 0 0 - 0  Feeling bad or failure about yourself  0 0 0 - 0  Trouble concentrating 0 0 0 - 0  Moving slowly or fidgety/restless 0 0 0 - 0  Suicidal thoughts 0 0 0 - 0  PHQ-9 Score 0 0 0 - 2  Difficult doing work/chores Not difficult at all Not difficult at all Not difficult at all - Not difficult at all  Some recent data might be hidden   Hypertension: BP Readings from Last 3 Encounters:  09/30/21 128/82  08/03/21 (!) 142/70  02/28/21 138/84   Obesity: Wt Readings from Last 3 Encounters:  09/30/21 207 lb 4.8 oz (94 kg)  08/03/21 203 lb 8 oz (92.3 kg)  02/28/21 196 lb 4.8 oz (89 kg)   BMI Readings from Last 3 Encounters:  09/30/21 36.72 kg/m  08/03/21 36.05 kg/m  02/28/21 34.77 kg/m     Vaccines:  HPV: up to at age 66 , ask insurance if age between 29-45  Shingrix: 34-64 yo and ask insurance if covered when patient above 37 yo Pneumonia:  educated and discussed with patient. Flu:  educated and discussed with patient.  Hep C Screening: 10/17/2017 STD testing and prevention (HIV/chl/gon/syphilis): 10/17/2017 Intimate partner  violence:none Sexual History :yes, one partner Menstrual History/LMP/Abnormal Bleeding: hysterectomy Incontinence Symptoms: none  Breast cancer:  - Last Mammogram: 08/15/2021 - BRCA gene screening: none  Osteoporosis: Discussed high calcium and vitamin D supplementation, weight bearing exercises  Cervical cancer screening: hysterectomy  Skin cancer: Discussed monitoring for atypical lesions  Colorectal cancer: due for colonoscopy, ordered  Lung cancer:   Low Dose CT Chest recommended if Age 46-80 years, 20 pack-year currently smoking OR have quit w/in 15years. Patient does not qualify.   ECG: none  Advanced Care Planning: A voluntary discussion about advance care planning including the explanation and discussion of advance directives.  Discussed health care proxy and Living will, and the patient was able to identify a health care proxy as husband or kids.  Patient does not have a living will at present time. If patient does have living will, I have requested they bring this to the clinic to be scanned in to their chart.  Lipids: Lab Results  Component Value Date   CHOL 164 02/28/2021   CHOL 126 12/30/2019   CHOL 128 06/30/2019   Lab Results  Component Value Date   HDL 48 (L) 02/28/2021   HDL 42 (L) 12/30/2019   HDL 41 (L)  06/30/2019   Lab Results  Component Value Date   LDLCALC 96 02/28/2021   LDLCALC 68 12/30/2019   LDLCALC 70 06/30/2019   Lab Results  Component Value Date   TRIG 105 02/28/2021   TRIG 78 12/30/2019   TRIG 89 06/30/2019   Lab Results  Component Value Date   CHOLHDL 3.4 02/28/2021   CHOLHDL 3.0 12/30/2019   CHOLHDL 3.1 06/30/2019   No results found for: LDLDIRECT  Glucose: Glucose, Bld  Date Value Ref Range Status  02/28/2021 107 (H) 65 - 99 mg/dL Final    Comment:    .            Fasting reference interval . For someone without known diabetes, a glucose value between 100 and 125 mg/dL is consistent with prediabetes and should be confirmed  with a follow-up test. .   04/07/2020 105 (H) 65 - 99 mg/dL Final    Comment:    .            Fasting reference interval . For someone without known diabetes, a glucose value between 100 and 125 mg/dL is consistent with prediabetes and should be confirmed with a follow-up test. .   12/30/2019 127 (H) 65 - 99 mg/dL Final    Comment:    .            Fasting reference interval . For someone without known diabetes, a glucose value >125 mg/dL indicates that they may have diabetes and this should be confirmed with a follow-up test. .     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   [place] allergy 08/30/2015   Preventative health care 08/30/2015   Type 2 diabetes mellitus without complication, without long-term current use of insulin ([place]) 04/27/2015   Hyperlipidemia 04/27/2015   Essential hypertension, benign 04/27/2015   Microcytosis 04/27/2015   Class 2 severe obesity with serious comorbidity and body mass index (BMI) of 37.0 to 37.9 in adult ([place]) 04/27/2015   Gastroesophageal reflux disease without esophagitis 01/08/2015    Past Surgical History:  Procedure Laterality Date   ABDOMINAL HYSTERECTOMY  2003   still has ovaries-for menorrhagia   CESAREAN SECTION     COLONOSCOPY  02/22/14   repeat 5 years   ESOPHAGOGASTRODUODENOSCOPY  02/22/14   TUBAL LIGATION      Family History  Problem Relation Age of Onset   Diabetes Mother    Heart disease Mother    Hypertension Mother    Stroke Mother    Diabetes Father    Hypertension Father    Asthma Sister     Diabetes Sister    Hypertension Sister    Lung disease Sister    Hypertension Brother    Heart disease Brother    Stroke Brother    Asthma Sister    Diabetes Sister    Hypertension Sister    Congestive Heart Failure Maternal Grandmother    Cancer Paternal Grandfather        stomach cancer   COPD Neg Hx     Social History   Socioeconomic History   Marital status: Married  Spouse name: Not on file   Number of children: Not on file   Years of education: Not on file   Highest education level: Not on file  Occupational History   Not on file  Tobacco Use   Smoking status: Never   Smokeless tobacco: Never  Vaping Use   Vaping Use: Never used  Substance and Sexual Activity   Alcohol use: No   Drug use: No   Sexual activity: Yes  Other Topics Concern   Not on file  Social History Narrative   Not on file   Social Determinants of Health   Financial Resource Strain: Low Risk    Difficulty of Paying Living Expenses: Not hard at all  Food Insecurity: No Food Insecurity   Worried About Charity fundraiser in the Last Year: Never true   [place] in the Last Year: Never true  Transportation Needs: No Transportation Needs   Lack of Transportation (Medical): No   Lack of Transportation (Non-Medical): No  Physical Activity: Insufficiently Active   Days of Exercise per Week: 1 day   Minutes of Exercise per Session: 30 min  Stress: No Stress Concern Present   Feeling of Stress : Only a little  Social Connections: Engineer, building services of Communication with Friends and Family: Twice a week   Frequency of Social Gatherings with Friends and Family: More than three times a week   Attends Religious Services: More than 4 times per year   Active Member of [employer] or Organizations: No   Attends Music therapist: 1 to 4 times per year   Marital Status: Married  Human resources officer Violence: Not At Risk   Fear of Current or Ex-Partner: No   Emotionally  Abused: No   Physically Abused: No   Sexually Abused: No     Current Outpatient Medications:    albuterol (PROVENTIL HFA;VENTOLIN HFA) 108 (90 BASE) MCG/ACT inhaler, Inhale 2 puffs into the lungs every 2 (two) hours as needed for wheezing or shortness of breath., Disp: , Rfl:    amLODipine (NORVASC) 5 MG tablet, Take 1 tablet (5 mg total) by mouth daily., Disp: 90 tablet, Rfl: 3   aspirin 81 MG tablet, Take 81 mg by mouth daily., Disp: , Rfl:    atorvastatin (LIPITOR) 80 MG tablet, Take 1 tablet (80 mg total) by mouth daily., Disp: 90 tablet, Rfl: 3   cholecalciferol (VITAMIN D3) 25 MCG (1000 UT) tablet, Take 1,000 Units by mouth daily., Disp: , Rfl:    ezetimibe (ZETIA) 10 MG tablet, Take 1 tablet (10 mg total) by mouth daily., Disp: 90 tablet, Rfl: 3   glipiZIDE (GLUCOTROL XL) 10 MG 24 hr tablet, Take 1 tablet (10 mg total) by mouth 2 (two) times daily., Disp: 180 tablet, Rfl: 3   losartan (COZAAR) 50 MG tablet, Take 1 tablet (50 mg total) by mouth daily., Disp: 90 tablet, Rfl: 3   meloxicam (MOBIC) 7.5 MG tablet, Take 1 tablet (7.5 mg total) by mouth daily., Disp: 30 tablet, Rfl: 1   omeprazole (PRILOSEC) 20 MG capsule, Take 20 mg by mouth daily., Disp: , Rfl:    pioglitazone (ACTOS) 15 MG tablet, Take 1 tablet (15 mg total) by mouth daily., Disp: 90 tablet, Rfl: 3   sitaGLIPtin (JANUVIA) 25 MG tablet, Take 1 tablet (25 mg total) by mouth daily., Disp: 90 tablet, Rfl: 3   blood glucose meter kit and supplies KIT, Dispense based on patient and insurance preference. Use up  to four times daily as directed. (FOR ICD-9 250.00, 250.01). (Patient not taking: Reported on 09/30/2021), Disp: 1 each, Rfl: 0  Allergies  Allergen Reactions   Metformin And Related Other (See Comments)    Rash, Thrush    Other     walnuts     ROS  Constitutional: Negative for fever or weight change.  Respiratory: Negative for cough and shortness of breath.   Cardiovascular: Negative for chest pain or  palpitations.  Gastrointestinal: Negative for abdominal pain, no bowel changes.  Musculoskeletal: Negative for gait problem or joint swelling.  Skin: Negative for rash.  Neurological: Negative for dizziness or headache.  No other specific complaints in a complete review of systems (except as listed in HPI above).   Objective  Vitals:   09/30/21 0826  BP: 128/82  Pulse: 88  Resp: 16  Temp: 98.1 F (36.7 C)  TempSrc: Oral  SpO2: 98%  Weight: 207 lb 4.8 oz (94 kg)  Height: _0  (1.6 m)    Body mass index is 36.72 kg/m.  Physical Exam  Constitutional: Patient appears well-developed and well-nourished. No distress.  HENT: Head: Normocephalic and atraumatic. Ears: B TMs ok, no erythema or effusion; Nose: Nose normal. Mouth/Throat: Oropharynx is clear and moist. No oropharyngeal exudate.  Eyes: Conjunctivae and EOM are normal. Pupils are equal, round, and reactive to light. No scleral icterus.  Neck: Normal range of motion. Neck supple. No JVD present. No thyromegaly present.  Cardiovascular: Normal rate, regular rhythm and normal heart sounds.  No murmur heard. No BLE edema. Pulmonary/Chest: Effort normal and breath sounds normal. No respiratory distress. Abdominal: Soft. Bowel sounds are normal, no distension. There is no tenderness. no masses Breast: no lumps or masses, no nipple discharge or rashes Musculoskeletal: Normal range of motion, no joint effusions. No gross deformities Neurological: he is alert and oriented to person, place, and time. No cranial nerve deficit. Coordination, balance, strength, speech and gait are normal.  Skin: Skin is warm and dry. No rash noted. No erythema.  Psychiatric: Patient has a normal mood and affect. behavior is normal. Judgment and thought content normal.   Recent Results (from the past 2160 hour(s))  HM DIABETES EYE EXAM     Status: None   Collection Time: 08/23/21 12:00 AM  Result Value Ref Range   HM Diabetic Eye Exam No Retinopathy  No Retinopathy     Fall Risk: Fall Risk  09/30/2021 08/03/2021 02/28/2021 07/07/2020 03/26/2020  Falls in the past year? 0 0 0 1 0  Number falls in past yr: 0 0 0 0 0  Injury with Fall? 0 0 0 0 0  Risk for fall due to : - No Fall Risks - - -  Follow up Falls evaluation completed Falls prevention discussed - Falls evaluation completed -    Functional Status Survey: Is the patient deaf or have difficulty hearing?: No Does the patient have difficulty seeing, even when wearing glasses/contacts?: No Does the patient have difficulty concentrating, remembering, or making decisions?: No Does the patient have difficulty walking or climbing stairs?: Yes Does the patient have difficulty dressing or bathing?: No Does the patient have difficulty doing errands alone such as visiting a doctor's office or shopping?: No   Assessment & Plan  1. Physical exam, annual -increase physical activity - Lipid panel - CBC with Differential/Platelet - COMPLETE METABOLIC PANEL WITH GFR - Hemoglobin A1c - Microalbumin / creatinine urine ratio  2. Need for Tdap vaccination  - Tdap vaccine greater than  or equal to 7yo IM  3. Type 2 diabetes mellitus with other specified complication, without long-term current use of insulin (HCC) -continue current treatment - COMPLETE METABOLIC PANEL WITH GFR - Hemoglobin A1c - Microalbumin / creatinine urine ratio  4. Hyperlipidemia, unspecified hyperlipidemia type -continue current treatment - Lipid panel - COMPLETE METABOLIC PANEL WITH GFR  5. Essential hypertension, benign -continue current treatment - CBC with Differential/Platelet - COMPLETE METABOLIC PANEL WITH GFR  6. Gastroesophageal reflux disease, unspecified whether esophagitis present -continue current treatrment  -USPSTF grade A and B recommendations reviewed with patient; age-appropriate recommendations, preventive care, screening tests, etc discussed and encouraged; healthy living encouraged; see AVS  for patient education given to patient -Discussed importance of 150 minutes of physical activity weekly, eat two servings of fish weekly, eat one serving of tree nuts ( cashews, pistachios, pecans, almonds.Marland Kitchen) every other day, eat 6 servings of fruit/vegetables daily and drink plenty of water and avoid sweet beverages.

## 2021-10-01 LAB — COMPLETE METABOLIC PANEL WITH GFR
AG Ratio: 1.7 (calc) (ref 1.0–2.5)
ALT: 13 U/L (ref 6–29)
AST: 18 U/L (ref 10–35)
Albumin: 3.9 g/dL (ref 3.6–5.1)
Alkaline phosphatase (APISO): 66 U/L (ref 37–153)
BUN: 15 mg/dL (ref 7–25)
CO2: 27 mmol/L (ref 20–32)
Calcium: 9.4 mg/dL (ref 8.6–10.4)
Chloride: 106 mmol/L (ref 98–110)
Creat: 0.66 mg/dL (ref 0.50–1.03)
Globulin: 2.3 g/dL (calc) (ref 1.9–3.7)
Glucose, Bld: 115 mg/dL — ABNORMAL HIGH (ref 65–99)
Potassium: 4.1 mmol/L (ref 3.5–5.3)
Sodium: 141 mmol/L (ref 135–146)
Total Bilirubin: 1.1 mg/dL (ref 0.2–1.2)
Total Protein: 6.2 g/dL (ref 6.1–8.1)
eGFR: 101 mL/min/{1.73_m2} (ref >=60)

## 2021-10-01 LAB — MICROALBUMIN / CREATININE URINE RATIO
Creatinine, Urine: 118 mg/dL (ref 20–275)
Microalb Creat Ratio: 10 mcg/mg creat (ref <30)
Microalb, Ur: 1.2 mg/dL

## 2021-10-01 LAB — CBC WITH DIFFERENTIAL/PLATELET
Absolute Monocytes: 754 cells/uL (ref 200–950)
Basophils Absolute: 74 cells/uL (ref 0–200)
Basophils Relative: 0.8 %
Eosinophils Absolute: 129 cells/uL (ref 15–500)
Eosinophils Relative: 1.4 %
HCT: 38.3 % (ref 35.0–45.0)
Hemoglobin: 12.3 g/dL (ref 11.7–15.5)
Lymphs Abs: 1500 cells/uL (ref 850–3900)
MCH: 24.5 pg — ABNORMAL LOW (ref 27.0–33.0)
MCHC: 32.1 g/dL (ref 32.0–36.0)
MCV: 76.3 fL — ABNORMAL LOW (ref 80.0–100.0)
MPV: 12.5 fL (ref 7.5–12.5)
Monocytes Relative: 8.2 %
Neutro Abs: 6744 cells/uL (ref 1500–7800)
Neutrophils Relative %: 73.3 %
Platelets: 214 10*3/uL (ref 140–400)
RBC: 5.02 10*6/uL (ref 3.80–5.10)
RDW: 16.1 % — ABNORMAL HIGH (ref 11.0–15.0)
Total Lymphocyte: 16.3 %
WBC: 9.2 10*3/uL (ref 3.8–10.8)

## 2021-10-01 LAB — HEMOGLOBIN A1C
Hgb A1c MFr Bld: 6.1 % of total Hgb — ABNORMAL HIGH (ref <5.7)
Mean Plasma Glucose: 128 mg/dL
eAG (mmol/L): 7.1 mmol/L

## 2021-10-01 LAB — LIPID PANEL
Cholesterol: 128 mg/dL (ref <200)
HDL: 45 mg/dL — ABNORMAL LOW (ref >=50)
LDL Cholesterol (Calc): 69 mg/dL (calc)
Non-HDL Cholesterol (Calc): 83 mg/dL (calc) (ref <130)
Total CHOL/HDL Ratio: 2.8 (calc) (ref <5.0)
Triglycerides: 63 mg/dL (ref <150)

## 2021-10-12 ENCOUNTER — Other Ambulatory Visit: Payer: Self-pay | Admitting: Nurse Practitioner

## 2021-10-12 DIAGNOSIS — G8929 Other chronic pain: Secondary | ICD-10-CM

## 2021-10-12 NOTE — Telephone Encounter (Signed)
Requested Prescriptions  ?Pending Prescriptions Disp Refills  ?? meloxicam (MOBIC) 7.5 MG tablet [Pharmacy Med Name: MELOXICAM 7.5 MG TABLET] 90 tablet 0  ?  Sig: TAKE 1 TABLET BY MOUTH EVERY DAY  ?  ? Analgesics:  COX2 Inhibitors Failed - 10/12/2021  1:51 AM  ?  ?  Failed - Manual Review: Labs are only required if the patient has taken medication for more than 8 weeks.  ?  ?  Passed - HGB in normal range and within 360 days  ?  Hemoglobin  ?Date Value Ref Range Status  ?09/30/2021 12.3 11.7 - 15.5 g/dL Final  ?08/30/2015 12.6 11.1 - 15.9 g/dL Final  ?   ?  ?  Passed - Cr in normal range and within 360 days  ?  Creat  ?Date Value Ref Range Status  ?09/30/2021 0.66 0.50 - 1.03 mg/dL Final  ? ?Creatinine, Urine  ?Date Value Ref Range Status  ?09/30/2021 118 20 - 275 mg/dL Final  ?   ?  ?  Passed - HCT in normal range and within 360 days  ?  HCT  ?Date Value Ref Range Status  ?09/30/2021 38.3 35.0 - 45.0 % Final  ? ?Hematocrit  ?Date Value Ref Range Status  ?08/30/2015 36.1 34.0 - 46.6 % Final  ?   ?  ?  Passed - AST in normal range and within 360 days  ?  AST  ?Date Value Ref Range Status  ?09/30/2021 18 10 - 35 U/L Final  ?   ?  ?  Passed - ALT in normal range and within 360 days  ?  ALT  ?Date Value Ref Range Status  ?09/30/2021 13 6 - 29 U/L Final  ?   ?  ?  Passed - eGFR is 30 or above and within 360 days  ?  GFR, Est African American  ?Date Value Ref Range Status  ?04/07/2020 116 > OR = 60 mL/min/1.53m Final  ? ?GFR, Est Non African American  ?Date Value Ref Range Status  ?04/07/2020 100 > OR = 60 mL/min/1.747mFinal  ? ?eGFR  ?Date Value Ref Range Status  ?09/30/2021 101 > OR = 60 mL/min/1.7371minal  ?  Comment:  ?  The eGFR is based on the CKD-EPI 2021 equation. To calculate  ?the new eGFR from a previous Creatinine or Cystatin C ?result, go to https://www.kidney.org/professionals/ ?kdoqi/gfr%5Fcalculator ?  ?   ?  ?  Passed - Patient is not pregnant  ?  ?  Passed - Valid encounter within last 12 months  ?   Recent Outpatient Visits   ?      ? 1 week ago [facility], annual  [name]  ? 2 months ago Mass of upper outer quadrant of left breast  ? [facility]  ? 7 months ago Essential hypertension, benign  ? [facility]  ? 1 year ago Type 2 diabetes mellitus without complication, without long-term current use of insulin ([place]? [facility]  ? 1 year ago Type 2 diabetes mellitus without complication, without long-term current use of insulin ([place]? [facility]  ?  ?Future Appointments   ?        ? In 2 months PenReece PackerulMyna HidalgoNP[facility]  ? ?  ?  ?  ? ? ?

## 2021-10-19 ENCOUNTER — Encounter: Payer: Self-pay | Admitting: Nurse Practitioner

## 2021-10-19 ENCOUNTER — Other Ambulatory Visit: Payer: Self-pay

## 2021-10-19 ENCOUNTER — Ambulatory Visit (INDEPENDENT_AMBULATORY_CARE_PROVIDER_SITE_OTHER): Payer: BC Managed Care – PPO | Admitting: Nurse Practitioner

## 2021-10-19 VITALS — BP 142/80 | HR 81 | Temp 97.7°F | Resp 16 | Ht 63.0 in | Wt 202.9 lb

## 2021-10-19 DIAGNOSIS — R350 Frequency of micturition: Secondary | ICD-10-CM | POA: Diagnosis not present

## 2021-10-19 DIAGNOSIS — M5441 Lumbago with sciatica, right side: Secondary | ICD-10-CM

## 2021-10-19 LAB — POCT URINALYSIS DIPSTICK
Bilirubin, UA: NEGATIVE
Glucose, UA: NEGATIVE
Ketones, UA: NEGATIVE
Leukocytes, UA: NEGATIVE
Nitrite, UA: NEGATIVE
Protein, UA: POSITIVE — AB
Spec Grav, UA: 1.02 (ref 1.010–1.025)
Urobilinogen, UA: 0.2 E.U./dL
pH, UA: 5 (ref 5.0–8.0)

## 2021-10-19 MED ORDER — METHOCARBAMOL 500 MG PO TABS: 500.0000 mg | ORAL_TABLET | Freq: Two times a day (BID) | ORAL | 0 refills | Status: DC | PRN

## 2021-10-19 MED ORDER — PREDNISONE 10 MG (21) PO TBPK: ORAL_TABLET | ORAL | 0 refills | Status: DC

## 2021-10-19 MED ORDER — NAPROXEN 500 MG PO TABS
500.0000 mg | ORAL_TABLET | Freq: Two times a day (BID) | ORAL | 0 refills | Status: AC
Start: 2021-10-19 — End: 2021-10-26

## 2021-10-19 NOTE — Progress Notes (Signed)
? ?BP (!) 142/80   Pulse 81   Temp 97.7 ?F (36.5 ?C) (Oral)   Resp 16   Ht 5\' 3"  (1.6 m)   Wt 202 lb 14.4 oz (92 kg)   SpO2 99%   BMI 35.94 kg/m?   ? ?Subjective:  ? ? Patient ID: Elizabeth Scott, female    DOB: 07-08-1961, 61 y.o.   MRN: [id number] ? ?HPI: ?Elizabeth Scott is a 61 y.o. female ? ?Chief Complaint  ?Patient presents with  ? Back Pain  ?  Radiates from mid back to lower  ? ?Right side Mid to Low Back pain: Started a few days ago.  She says that she has had some pain go down her right leg but she thinks that is associated with her knee pain.  She says she is not sure how she hurt her back. She denies any fever, chills, dysuria or loss of bowels or urine.  She denies any history of back pain.  She describes the pain as sharp with movement, otherwise achy.  Will get urine to rule out uti.  Treating with steroid taper, naproxen and robaxin.  ? ?Urinary frequency:  She did have some urinary frequency yesterday.  She denies any fever or dysuria.  Will get urine and send for culture if indicated.  ? ?Relevant past medical, surgical, family and social history reviewed and updated as indicated. Interim medical history since our last visit reviewed. ?Allergies and medications reviewed and updated. ? ?Review of Systems ? ?Constitutional: Negative for fever or weight change.  ?Respiratory: Negative for cough and shortness of breath.   ?Cardiovascular: Negative for chest pain or palpitations.  ?Gastrointestinal: Negative for abdominal pain, no bowel changes.  ?Musculoskeletal: Negative for gait problem or joint swelling. Positive for back pain ?Skin: Negative for rash.  ?Neurological: Negative for dizziness or headache.  ?No other specific complaints in a complete review of systems (except as listed in HPI above).  ? ?   ?Objective:  ?  ?BP (!) 142/80   Pulse 81   Temp 97.7 ?F (36.5 ?C) (Oral)   Resp 16   Ht 5\' 3"  (1.6 m)   Wt 202 lb 14.4 oz (92 kg)   SpO2 99%   BMI 35.94 kg/m?   ?Wt  Readings from Last 3 Encounters:  ?10/19/21 202 lb 14.4 oz (92 kg)  ?09/30/21 207 lb 4.8 oz (94 kg)  ?08/03/21 203 lb 8 oz (92.3 kg)  ?  ?Physical Exam ? ?Constitutional: Patient appears well-developed and well-nourished. Obese  No distress.  ?HEENT: head atraumatic, normocephalic, pupils equal and reactive to light,  neck supple ?Cardiovascular: Normal rate, regular rhythm and normal heart sounds.  No murmur heard. No BLE edema. ?Pulmonary/Chest: Effort normal and breath sounds normal. No respiratory distress. ?Abdominal: Soft.  There is no tenderness. NO CVA tenderness ?MSK: right side lower back tenderness ?Psychiatric: Patient has a normal mood and affect. behavior is normal. Judgment and thought content normal.  ? ?Results for orders placed or performed in visit on 10/19/21  ?POCT urinalysis dipstick  ?Result Value Ref Range  ? Color, UA yellow   ? Clarity, UA clear   ? Glucose, UA Negative Negative  ? Bilirubin, UA negative   ? Ketones, UA negative   ? Spec Grav, UA 1.020 1.010 - 1.025  ? Blood, UA none   ? pH, UA 5.0 5.0 - 8.0  ? Protein, UA Positive (A) Negative  ? Urobilinogen, UA 0.2 0.2 or 1.0 E.U./dL  ?  Nitrite, UA negative   ? Leukocytes, UA Negative Negative  ? Appearance clear   ? Odor none   ? ?   ?Assessment & Plan:  ? ?1. Acute right-sided low back pain with right-sided sciatica ? ?- methocarbamol (ROBAXIN) 500 MG tablet; Take 1 tablet (500 mg total) by mouth 2 (two) times daily as needed for muscle spasms.  Dispense: 20 tablet; Refill: 0 ?- naproxen (NAPROSYN) 500 MG tablet; Take 1 tablet (500 mg total) by mouth 2 (two) times daily with a meal for 7 days.  Dispense: 14 tablet; Refill: 0 ?- predniSONE (STERAPRED UNI-PAK 21 TAB) 10 MG (21) TBPK tablet; Take as directed on package.  (60 mg po on day 1, 50 mg po on day 2...)  Dispense: 21 tablet; Refill: 0 ?- POCT urinalysis dipstick ? ?2. Urinary frequency ? ?- POCT urinalysis dipstick  ? ?Follow up plan: ?Return if symptoms worsen or fail to  improve. ? ? ? ? ? ?

## 2022-01-04 ENCOUNTER — Ambulatory Visit: Payer: BC Managed Care – PPO | Admitting: Nurse Practitioner

## 2022-03-07 ENCOUNTER — Other Ambulatory Visit: Payer: Self-pay | Admitting: Family Medicine

## 2022-03-07 DIAGNOSIS — I10 Essential (primary) hypertension: Secondary | ICD-10-CM

## 2022-03-07 DIAGNOSIS — E785 Hyperlipidemia, unspecified: Secondary | ICD-10-CM

## 2022-03-07 DIAGNOSIS — E119 Type 2 diabetes mellitus without complications: Secondary | ICD-10-CM

## 2022-03-07 NOTE — Telephone Encounter (Signed)
Requested Prescriptions  Pending Prescriptions Disp Refills  . atorvastatin (LIPITOR) 80 MG tablet [Pharmacy Med Name: ATORVASTATIN 80 MG TABLET] 90 tablet 2    Sig: TAKE 1 TABLET BY MOUTH EVERY DAY     Cardiovascular:  Antilipid - Statins Failed - 03/07/2022  2:12 AM      Failed - Lipid Panel in normal range within the last 12 months    Cholesterol, Total  Date Value Ref Range Status  08/30/2015 151 100 - 199 mg/dL Final   Cholesterol  Date Value Ref Range Status  09/30/2021 128 <200 mg/dL Final   LDL Cholesterol (Calc)  Date Value Ref Range Status  09/30/2021 69 mg/dL (calc) Final    Comment:    Reference range: <100 . Desirable range <100 mg/dL for primary prevention;   <70 mg/dL for patients with CHD or diabetic patients  with > or = 2 CHD risk factors. Marland Kitchen LDL-C is now calculated using the Martin-Hopkins  calculation, which is a validated novel method providing  better accuracy than the Friedewald equation in the  estimation of LDL-C.  Horald Pollen et al. Lenox Ahr. 9147;829(56): 2061-2068  (http://education.[facility].com/faq/FAQ164)    HDL  Date Value Ref Range Status  09/30/2021 45 (L) > OR = 50 mg/dL Final  21/30/8657 41 >84 mg/dL Final   Triglycerides  Date Value Ref Range Status  09/30/2021 63 <150 mg/dL Final         Passed - Patient is not pregnant      Passed - Valid encounter within last 12 months    Recent Outpatient Visits          4 months ago Acute right-sided low back pain with right-sided sciatica   [facility] Berniece Salines, [name]   5 months ago Physical exam, annual   [facility] [place] Berniece Salines, [name]   7 months ago Mass of upper outer quadrant of left breast   [facility] Berniece Salines, [name]   1 year ago Essential hypertension, benign   [facility] Danelle Berry, [name]   1 year ago Type 2 diabetes mellitus without complication, without long-term current use of  insulin (HCC)   [facility] [place], [place], [name]             . losartan (COZAAR) 50 MG tablet [Pharmacy Med Name: LOSARTAN POTASSIUM 50 MG TAB] 90 tablet 0    Sig: TAKE 1 TABLET BY MOUTH EVERY DAY     Cardiovascular:  Angiotensin Receptor Blockers Failed - 03/07/2022  2:12 AM      Failed - Last BP in normal range    BP Readings from Last 1 Encounters:  10/19/21 (!) 142/80         Passed - Cr in normal range and within 180 days    Creat  Date Value Ref Range Status  09/30/2021 0.66 0.50 - 1.03 mg/dL Final   Creatinine, Urine  Date Value Ref Range Status  09/30/2021 118 20 - 275 mg/dL Final         Passed - K in normal range and within 180 days    Potassium  Date Value Ref Range Status  09/30/2021 4.1 3.5 - 5.3 mmol/L Final         Passed - Patient is not pregnant      Passed - Valid encounter within last 6 months    Recent Outpatient Visits          4 months  ago Acute right-sided low back pain with right-sided sciatica   [facility] Berniece Salines, [name]   5 months ago Physical exam, annual   [facility] Berniece Salines, [name]   7 months ago Mass of upper outer quadrant of left breast   [facility] Berniece Salines, [name]   1 year ago Essential hypertension, benign   [facility] Danelle Berry, [name]   1 year ago Type 2 diabetes mellitus without complication, without long-term current use of insulin (HCC)   [facility] [place], [place], [name]             . JANUVIA 25 MG tablet [Pharmacy Med Name: JANUVIA 25 MG TABLET] 90 tablet 0    Sig: TAKE 1 TABLET (25 MG TOTAL) BY MOUTH DAILY.     Endocrinology:  Diabetes - DPP-4 Inhibitors Passed - 03/07/2022  2:12 AM      Passed - HBA1C is between 0 and 7.9 and within 180 days    Hgb A1c MFr Bld  Date Value Ref Range Status  09/30/2021 6.1 (H) <5.7 % of total Hgb Final    Comment:    For someone without known  diabetes, a hemoglobin  A1c value between 5.7% and 6.4% is consistent with prediabetes and should be confirmed with a  follow-up test. . For someone with known diabetes, a value <7% indicates that their diabetes is well controlled. A1c targets should be individualized based on duration of diabetes, age, comorbid conditions, and other considerations. . This assay result is consistent with an increased risk of diabetes. . Currently, no consensus exists regarding use of hemoglobin A1c for diagnosis of diabetes for children. .          Passed - Cr in normal range and within 360 days    Creat  Date Value Ref Range Status  09/30/2021 0.66 0.50 - 1.03 mg/dL Final   Creatinine, Urine  Date Value Ref Range Status  09/30/2021 118 20 - 275 mg/dL Final         Passed - Valid encounter within last 6 months    Recent Outpatient Visits          4 months ago Acute right-sided low back pain with right-sided sciatica   [facility] Berniece Salines, [name]   5 months ago Physical exam, annual   [facility] Berniece Salines, [name]   7 months ago Mass of upper outer quadrant of left breast   [facility] Berniece Salines, [name]   1 year ago Essential hypertension, benign   [facility] Danelle Berry, [name]   1 year ago Type 2 diabetes mellitus without complication, without long-term current use of insulin [facility])   [facility] Danelle Berry, [place]

## 2022-03-10 ENCOUNTER — Other Ambulatory Visit: Payer: Self-pay | Admitting: Family Medicine

## 2022-03-10 DIAGNOSIS — E119 Type 2 diabetes mellitus without complications: Secondary | ICD-10-CM

## 2022-03-20 ENCOUNTER — Other Ambulatory Visit: Payer: Self-pay | Admitting: Family Medicine

## 2022-03-30 ENCOUNTER — Other Ambulatory Visit: Payer: Self-pay | Admitting: Family Medicine

## 2022-03-30 ENCOUNTER — Telehealth: Payer: Self-pay | Admitting: Nurse Practitioner

## 2022-03-30 DIAGNOSIS — I10 Essential (primary) hypertension: Secondary | ICD-10-CM

## 2022-03-30 DIAGNOSIS — E785 Hyperlipidemia, unspecified: Secondary | ICD-10-CM

## 2022-03-30 DIAGNOSIS — E119 Type 2 diabetes mellitus without complications: Secondary | ICD-10-CM

## 2022-03-31 ENCOUNTER — Other Ambulatory Visit: Payer: Self-pay | Admitting: Emergency Medicine

## 2022-03-31 DIAGNOSIS — I10 Essential (primary) hypertension: Secondary | ICD-10-CM

## 2022-03-31 MED ORDER — AMLODIPINE BESYLATE 5 MG PO TABS: 5.0000 mg | ORAL_TABLET | Freq: Every day | ORAL | 3 refills | Status: DC

## 2022-03-31 NOTE — Telephone Encounter (Signed)
Rx 03/07/22 #90- too soon Requested Prescriptions  Pending Prescriptions Disp Refills  . losartan (COZAAR) 50 MG tablet [Pharmacy Med Name: LOSARTAN POTASSIUM 50 MG TAB] 90 tablet 0    Sig: TAKE 1 TABLET BY MOUTH EVERY DAY     Cardiovascular:  Angiotensin Receptor Blockers Failed - 03/30/2022 11:18 AM      Failed - Cr in normal range and within 180 days    Creat  Date Value Ref Range Status  09/30/2021 0.66 0.50 - 1.03 mg/dL Final   Creatinine, Urine  Date Value Ref Range Status  09/30/2021 118 20 - 275 mg/dL Final         Failed - K in normal range and within 180 days    Potassium  Date Value Ref Range Status  09/30/2021 4.1 3.5 - 5.3 mmol/L Final         Failed - Last BP in normal range    BP Readings from Last 1 Encounters:  10/19/21 (!) 142/80         Passed - Patient is not pregnant      Passed - Valid encounter within last 6 months    Recent Outpatient Visits          5 months ago Acute right-sided low back pain with right-sided sciatica   [facility] Berniece Salines, [name]   6 months ago Physical exam, annual   [facility] Berniece Salines, [name]   8 months ago Mass of upper outer quadrant of left breast   [facility] Berniece Salines, [name]   1 year ago Essential hypertension, benign   [facility] Dan[facility] Berry, [name]   1 year ago Type 2 diabetes mellitus without complication, without long-term current use of insulin [facility])   [facility] Danelle Berry, [place]

## 2022-03-31 NOTE — Telephone Encounter (Signed)
Patient do not need this medication refilled.

## 2022-04-04 ENCOUNTER — Other Ambulatory Visit: Payer: Self-pay | Admitting: Family Medicine

## 2022-04-04 DIAGNOSIS — E785 Hyperlipidemia, unspecified: Secondary | ICD-10-CM

## 2022-04-25 ENCOUNTER — Telehealth (INDEPENDENT_AMBULATORY_CARE_PROVIDER_SITE_OTHER): Payer: BC Managed Care – PPO | Admitting: Nurse Practitioner

## 2022-04-25 ENCOUNTER — Encounter: Payer: Self-pay | Admitting: Nurse Practitioner

## 2022-04-25 ENCOUNTER — Other Ambulatory Visit: Payer: Self-pay

## 2022-04-25 DIAGNOSIS — U071 COVID-19: Secondary | ICD-10-CM

## 2022-04-25 MED ORDER — NIRMATRELVIR/RITONAVIR (PAXLOVID)TABLET: 3.0000 | ORAL_TABLET | Freq: Two times a day (BID) | ORAL | 0 refills | Status: AC

## 2022-04-25 MED ORDER — BENZONATATE 100 MG PO CAPS: 200.0000 mg | ORAL_CAPSULE | Freq: Three times a day (TID) | ORAL | 0 refills | Status: DC | PRN

## 2022-04-25 NOTE — Progress Notes (Signed)
Name: Elizabeth Scott   MRN: [id number]    DOB: Nov 30, 1960   Date:04/25/2022       Progress Note  Subjective  Chief Complaint  Chief Complaint  Patient presents with   Covid Positive    Symptoms started on Sunday and tested positive yesterday    I connected with  Elizabeth Scott  on 04/25/22 at 12:00 pm by a video enabled telemedicine application and verified that I am speaking with the correct person using two identifiers.  I discussed the limitations of evaluation and management by telemedicine and the availability of in person appointments. The patient expressed understanding and agreed to proceed with a virtual visit  Staff also discussed with the patient that there may be a patient responsible charge related to this service. Patient Location: home Provider Location: [facility] Additional Individuals present: alone  HPI  Covid-19: Patient reports symptoms started on Sunday.  She did a home covid test yesterday and it was positive.  Symptoms include headache, cough, nasal congestion and decreased appetite.  Patient denies any fever or shortness of breath.  She reports she has been taking an allergy pill and Tylenol.  Discussed patient can continue taking allergy pill add on Flonase, Mucinex.  Push fluids and get rest.  Discussed Paxlovid treatment for COVID.  Discussed side effects and administration.  Patient's last GFR was 101.  Patient would like to take the antiviral medication.  We will send in Paxlovid and Tessalon Perles for cough.  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   [place] allergy 08/30/2015   Preventative health care 08/30/2015   Type 2 diabetes mellitus without complication, without long-term current use of insulin ([place]) 04/27/2015   Hyperlipidemia 04/27/2015   Essential hypertension, benign 04/27/2015   Microcytosis 04/27/2015   Class 2 severe obesity with serious comorbidity and body mass index (BMI) of 37.0 to 37.9 in adult [facility]) 04/27/2015   Gastroesophageal reflux disease without esophagitis 01/08/2015    Social History   Tobacco Use   Smoking status: Never   Smokeless tobacco: Never  Substance Use Topics   Alcohol use: No     Current Outpatient Medications:    albuterol (PROVENTIL HFA;VENTOLIN HFA) 108 (90 BASE) MCG/ACT inhaler, Inhale 2 puffs into the lungs every 2 (two) hours as needed for wheezing or shortness of breath., Disp: , Rfl:    amLODipine (NORVASC) 5 MG tablet, Take 1 tablet (5 mg total) by mouth daily., Disp: 90 tablet, Rfl: 3   aspirin 325 MG tablet, , Disp: , Rfl:    atorvastatin (LIPITOR) 80 MG tablet, TAKE 1 TABLET BY MOUTH EVERY DAY, Disp: 90 tablet, Rfl: 2   ezetimibe (ZETIA) 10 MG tablet, TAKE 1 TABLET BY MOUTH EVERY DAY, Disp: 90 tablet, Rfl: 3   glipiZIDE (GLUCOTROL XL) 10 MG 24 hr tablet, TAKE 1 TABLET BY MOUTH TWICE A DAY, Disp: 180 tablet, Rfl: 3   JANUVIA 25 MG tablet, TAKE 1 TABLET (25 MG TOTAL) BY MOUTH DAILY., Disp: 90 tablet, Rfl: 0   losartan (COZAAR) 50 MG tablet, TAKE 1 TABLET BY MOUTH EVERY DAY, Disp: 90 tablet, Rfl:  0   methocarbamol (ROBAXIN) 500 MG tablet, Take 1 tablet (500 mg total) by mouth 2 (two) times daily as needed for muscle spasms., Disp: 20 tablet, Rfl: 0   omeprazole (PRILOSEC) 20 MG capsule, Take 20 mg by mouth daily., Disp: , Rfl:    pioglitazone (ACTOS) 15 MG tablet, TAKE 1 TABLET (15 MG TOTAL) BY MOUTH DAILY., Disp: 90 tablet, Rfl: 3   blood glucose meter kit and supplies KIT, Dispense based on patient and insurance preference. Use up to four times daily as directed. (FOR  ICD-9 250.00, 250.01). (Patient not taking: Reported on 09/30/2021), Disp: 1 each, Rfl: 0   cholecalciferol (VITAMIN D3) 25 MCG (1000 UT) tablet, Take 1,000 Units by mouth daily. (Patient not taking: Reported on 10/19/2021), Disp: , Rfl:    EPINEPHRINE 0.3 mg/0.3 mL IJ SOAJ injection, INJECT 0.3 MLS (0.3 MG TOTAL) INTO THE MUSCLE ONCE FOR 1 DOSE. FOR LIFE-THREATENING EMERGENCY ONLY (Patient not taking: Reported on 04/25/2022), Disp: 2 each, Rfl: 0   predniSONE (STERAPRED UNI-PAK 21 TAB) 10 MG (21) TBPK tablet, Take as directed on package.  (60 mg po on day 1, 50 mg po on day 2...) (Patient not taking: Reported on 04/25/2022), Disp: 21 tablet, Rfl: 0   Vitamin D, Ergocalciferol, (DRISDOL) 1.25 MG (50000 UNIT) CAPS capsule, , Disp: , Rfl:   Allergies  Allergen Reactions   Metformin And Related Other (See Comments)    Rash, Thrush    Other     walnuts    I personally reviewed active problem list, medication list, allergies with the patient/caregiver today.  ROS  Constitutional: Negative for fever or weight change.  HEENT: Positive for nasal congestion Respiratory: positive for cough and negative for shortness of breath.   Cardiovascular: Negative for chest pain or palpitations.  Gastrointestinal: Negative for abdominal pain, no bowel changes.  Musculoskeletal: Negative for gait problem or joint swelling.  Skin: Negative for rash.  Neurological: Negative for dizziness, positive headache.  No other specific complaints in a complete review of systems (except as listed in HPI above).   Objective  Virtual encounter, vitals not obtained.  There is no height or weight on file to calculate BMI.  Nursing Note and Vital Signs reviewed.  Physical Exam  Awake, alert, oriented x3, speaking in complete sentences.  No results found for this or any previous visit (from the past 72 hour(s)).  Assessment & Plan  Problem List Items Addressed This Visit   None Visit Diagnoses     COVID-19    -   Primary   push fluids get rest, can take zyrtec, flonase, mucinex, tylenol, vitmain D, C and zinc. Will send in paxlovid and tessalon perls.    Relevant Medications   benzonatate (TESSALON) 100 MG capsule   nirmatrelvir/ritonavir EUA (PAXLOVID) 20 x 150 MG & 10 x 100MG TABS        -Red flags and when to present for emergency care or RTC including fever >101.21F, chest pain, shortness of breath, new/worsening/un-resolving symptoms,  reviewed with patient at time of visit. Follow up and care instructions discussed and provided in AVS. - I discussed the assessment and treatment plan with the patient. The patient was provided an opportunity to ask questions and all were answered. The patient agreed with the plan and demonstrated an understanding of the instructions.  I provided 15 minutes of non-face-to-face time during this encounter.  Bo Merino, [name]

## 2022-05-02 IMAGING — US US BREAST*L* LIMITED INC AXILLA
2 series · 12 of 12 positions shown · non-contrast
Comparison: Previous exam(s).

CLINICAL DATA: Patient's physician palpated an abnormality in the
upper aspect of the left breast. Patient states that she does not
feel a palpable abnormality.

EXAM:
DIGITAL DIAGNOSTIC BILATERAL MAMMOGRAM WITH TOMOSYNTHESIS AND CAD;
ULTRASOUND LEFT BREAST LIMITED
TECHNIQUE: Bilateral digital diagnostic mammography and breast tomosynthesis
was performed. The images were evaluated with computer-aided
detection.; Targeted ultrasound examination of the left breast was
performed.

[Series 1: us breast*left* limited inc axilla · 0.06mm/px · 10 of 10 slices shown (1 of 2)]
[im 1/10]
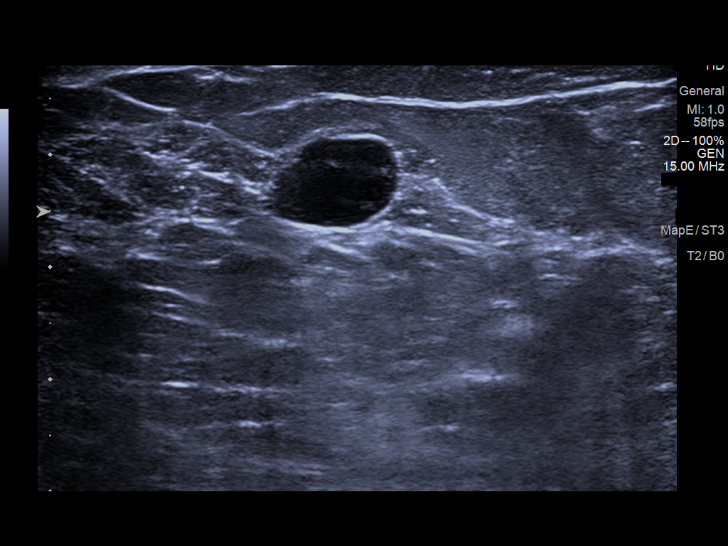
[im 2/10]
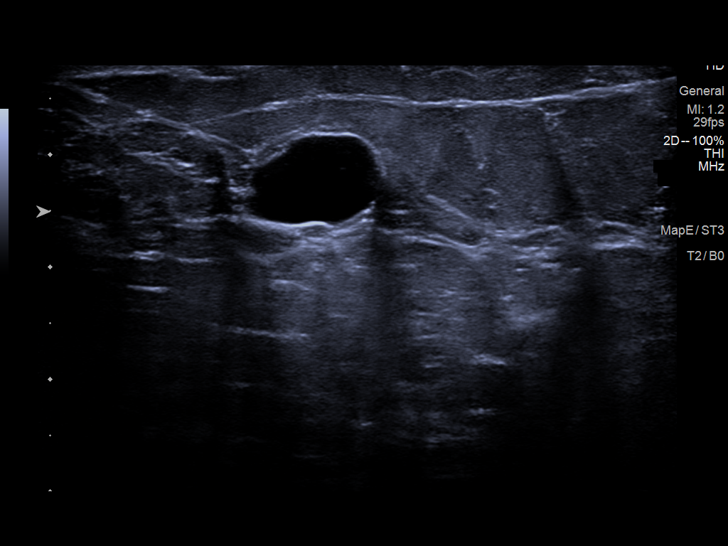
[im 3/10]
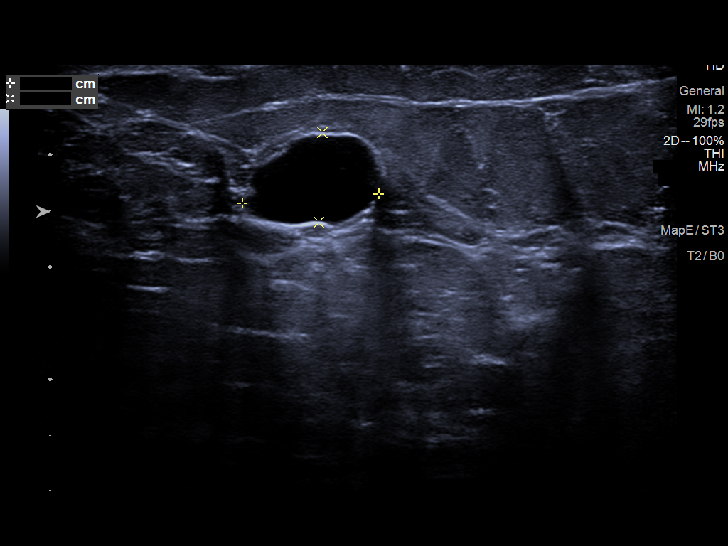
[im 4/10]
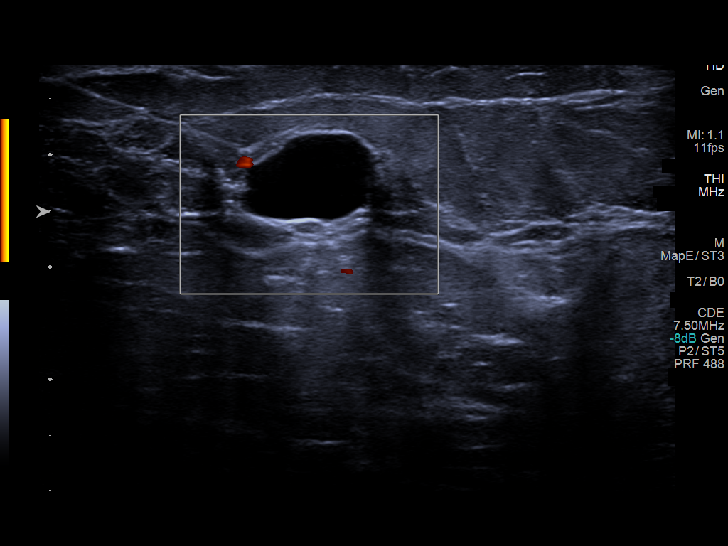
[im 5/10]
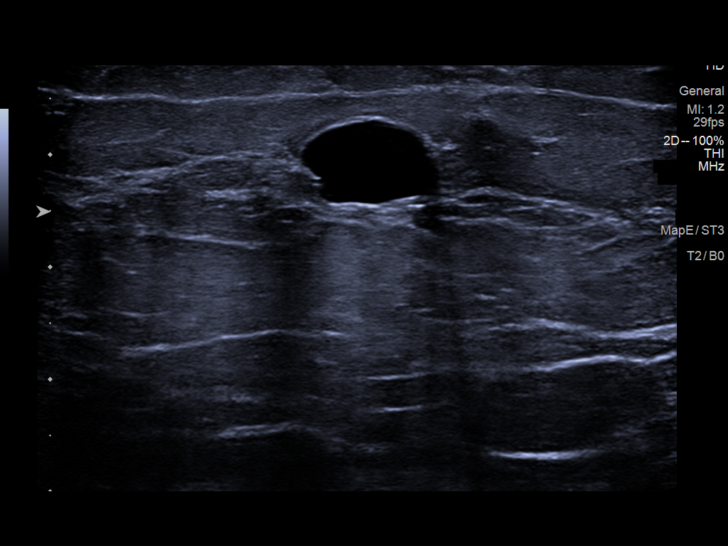
[im 6/10]
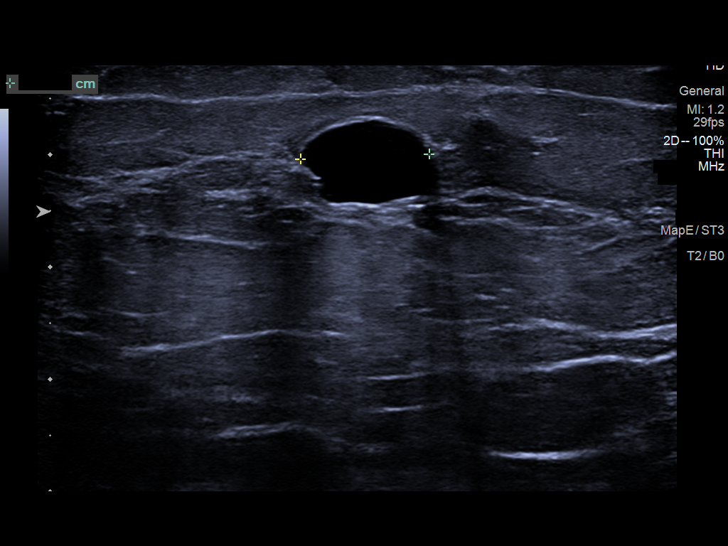
[im 7/10]
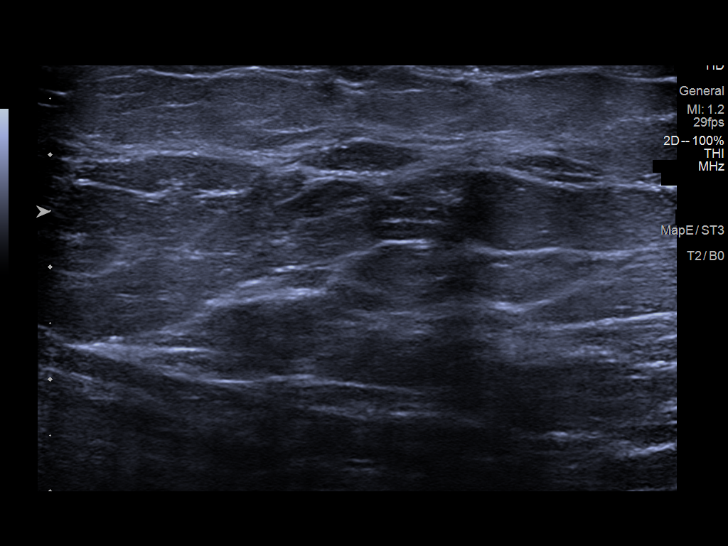
[im 8/10]
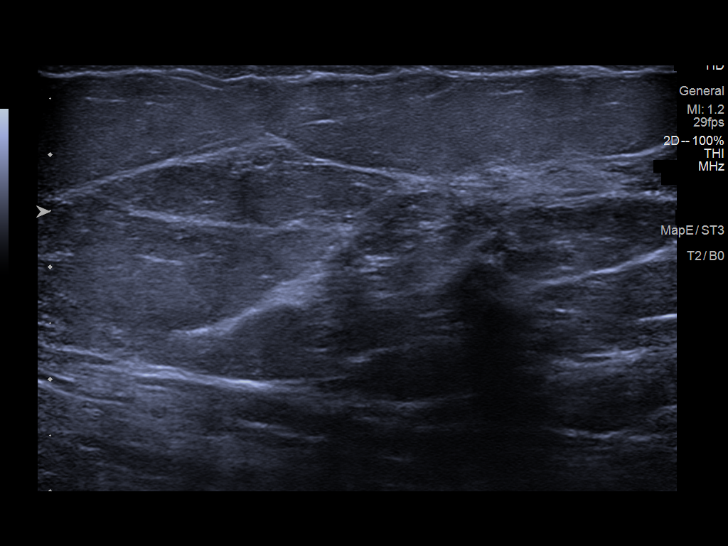
[im 9/10]
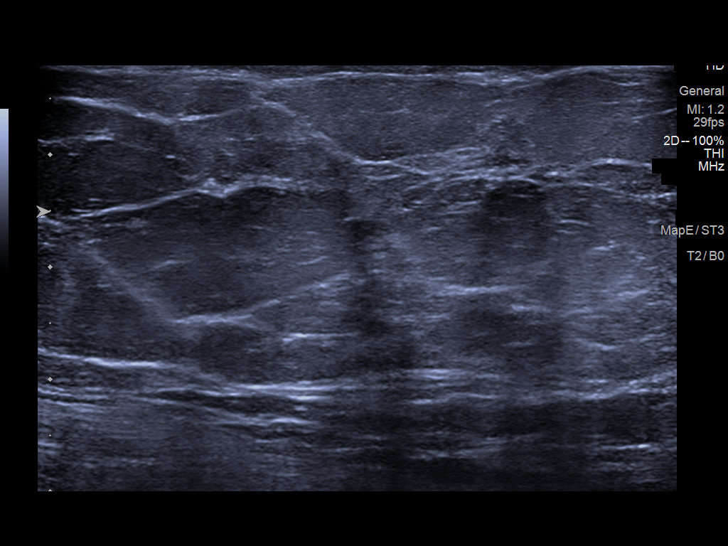
[im 10/10]
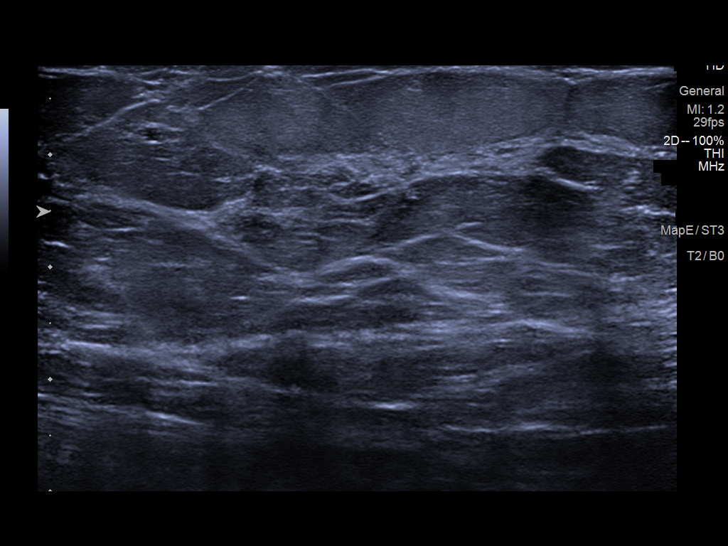

[Series 2: us breast*left* limited inc axilla · 0.06mm/px · 2 of 2 slices shown (2 of 2)]
[im 1/2]
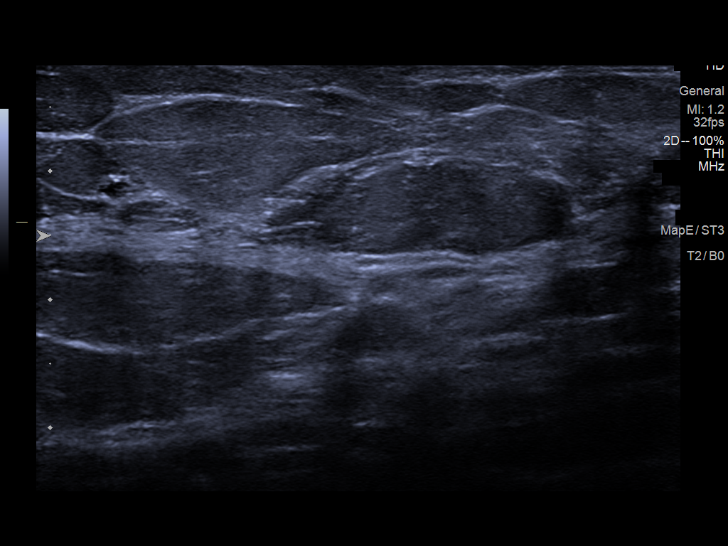
[im 2/2]
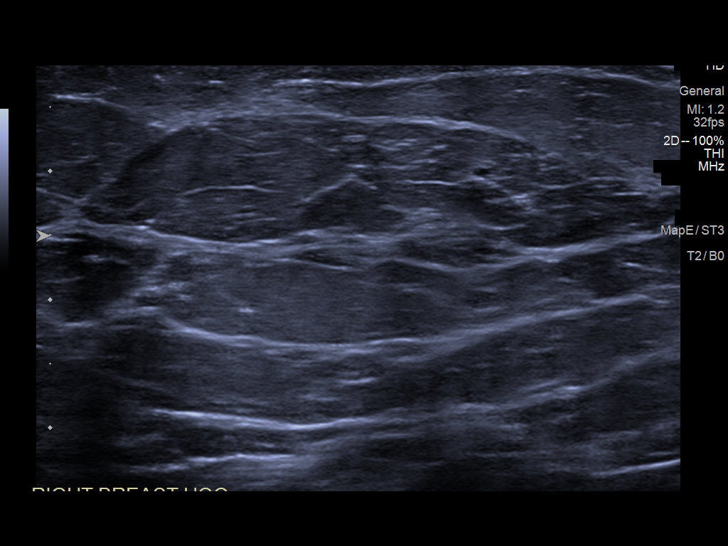

[12 of 12 positions shown; findings below may reference images not displayed]

ACR Breast Density Category b: There are scattered areas of
fibroglandular density.
FINDINGS: In the lower-inner quadrant of the left breast there is a
circumscribed 1.2 cm mass. No additional mass is seen in the left
breast. There are no malignant type microcalcifications in the left
breast.

No suspicious mass or malignant type microcalcifications identified
in the right breast.

On physical exam, I do not palpate a mass in the upper aspect of the
left breast or the lower-inner quadrant of the left breast.

Targeted ultrasound is performed, showing normal tissue throughout
the upper aspect of the left breast. There is an anechoic cyst in
the left breast at 7 o'clock 4 cm from the nipple measuring 1.2 x
0.8 x 1.2 cm.
IMPRESSION: No evidence of malignancy in either breast.

RECOMMENDATION:
Bilateral screening mammogram in 1 year is recommended.

I have discussed the findings and recommendations with the patient.
If applicable, a reminder letter will be sent to the patient
regarding the next appointment.

BI-RADS CATEGORY  2: Benign.

## 2022-05-17 ENCOUNTER — Ambulatory Visit: Payer: Self-pay

## 2022-05-17 NOTE — Telephone Encounter (Signed)
  Chief Complaint: cough Symptoms: cough, head congestion Frequency: 3 weeks Pertinent Negatives: NA Disposition: [] ED /[] Urgent Care (no appt availability in office) / [x] Appointment(In office/virtual)/ []  [place] [facility]/ [] Home Care/ [] Refused Recommended Disposition /[] [place] [facility]/ []  Follow-up with PCP Additional Notes: pt was dx with Covid 3 weeks ago. Was given medications and pt completed but cough is still present. Pt states she is taking Mucinex DM but doesn't help with the cough. Recommended coming in and being seen, scheduled appt for tomorrow at 0800.   Summary: cough and congestion   Pt called in for assistance pt says that she was prescribed benzonatate (TESSALON) 100 MG capsule for her cough and congestion. Pt says that she has completed medication and is still having the cough. Pt would like to know if provider could send in something else to pharmacy that will help?      Reason for Disposition  Cough has been present for > 3 weeks  Answer Assessment - Initial Assessment Questions 1. ONSET: "When did the cough begin?"      3 weeks  5. DIFFICULTY BREATHING: "Are you having difficulty breathing?" If Yes, ask: "How bad is it?" (e.g., mild, moderate, severe)    - MILD: No SOB at rest, mild SOB with walking, speaks normally in sentences, can lie down, no retractions, pulse < 100.    - MODERATE: SOB at rest, SOB with minimal exertion and prefers to sit, cannot lie down flat, speaks in phrases, mild retractions, audible wheezing, pulse 100-120.    - SEVERE: Very SOB at rest, speaks in single words, struggling to breathe, sitting hunched forward, retractions, pulse > 120      None  10. OTHER SYMPTOMS: "Do you have any other symptoms?" (e.g., runny nose, wheezing, chest pain)       Congestion  Protocols used: Cough - Acute Non-Productive-A-AH

## 2022-05-18 ENCOUNTER — Other Ambulatory Visit: Payer: Self-pay

## 2022-05-18 ENCOUNTER — Ambulatory Visit (INDEPENDENT_AMBULATORY_CARE_PROVIDER_SITE_OTHER): Payer: BC Managed Care – PPO | Admitting: Nurse Practitioner

## 2022-05-18 ENCOUNTER — Encounter: Payer: Self-pay | Admitting: Nurse Practitioner

## 2022-05-18 VITALS — BP 124/78 | HR 96 | Temp 97.9°F | Resp 16 | Ht 63.0 in | Wt 230.9 lb

## 2022-05-18 DIAGNOSIS — J0140 Acute pansinusitis, unspecified: Secondary | ICD-10-CM

## 2022-05-18 DIAGNOSIS — B379 Candidiasis, unspecified: Secondary | ICD-10-CM

## 2022-05-18 DIAGNOSIS — R051 Acute cough: Secondary | ICD-10-CM

## 2022-05-18 DIAGNOSIS — T3695XA Adverse effect of unspecified systemic antibiotic, initial encounter: Secondary | ICD-10-CM

## 2022-05-18 MED ORDER — BENZONATATE 100 MG PO CAPS: 200.0000 mg | ORAL_CAPSULE | Freq: Three times a day (TID) | ORAL | 0 refills | Status: DC | PRN

## 2022-05-18 MED ORDER — HYDROCOD POLI-CHLORPHE POLI ER 10-8 MG/5ML PO SUER: 5.0000 mL | Freq: Two times a day (BID) | ORAL | 0 refills | Status: DC

## 2022-05-18 MED ORDER — DOXYCYCLINE HYCLATE 100 MG PO TABS: 100.0000 mg | ORAL_TABLET | Freq: Two times a day (BID) | ORAL | 0 refills | Status: AC

## 2022-05-18 MED ORDER — FLUCONAZOLE 150 MG PO TABS: 150.0000 mg | ORAL_TABLET | ORAL | 0 refills | Status: DC | PRN

## 2022-05-18 NOTE — Progress Notes (Signed)
BP 124/78   Pulse 96   Temp 97.9 F (36.6 C) (Oral)   Resp 16   Ht 5\' 3"  (1.6 m)   Wt 230 lb 14.4 oz (104.7 kg)   SpO2 99%   BMI 40.90 kg/m    Subjective:    Patient ID: Elizabeth Scott, female    DOB: 1960-09-19, 61 y.o.   MRN: [id number]  HPI: Elizabeth Scott is a 61 y.o. female  Chief Complaint  Patient presents with   Cough    Congestion, hoarse for 3 weeks. Covid test yesterday negative   Continued cough/sinus infection:  Patient had covid on 04/25/2022.  Patient was treated with Paxlovid. She says she still has a cough, voice is horse and she still has sinus congestion.  She denies any fever or shortness of breath. She says her back is sore from all the coughing.  She says that coughing is worse at night.  She says she is still having some fatigue as well.  Lung sounds are clear.  Will treat with doxycycline, tussinex, and tessalon perls. Patient also reports that when she takes antibiotics she gets a yeast infection, diflucan sent in.   Relevant past medical, surgical, family and social history reviewed and updated as indicated. Interim medical history since our last visit reviewed. Allergies and medications reviewed and updated.  Review of Systems  Constitutional: Negative for fever or weight change.  HEENT: positive for nasal congestion Respiratory: positive for cough and negative for shortness of breath.   Cardiovascular: Negative for chest pain or palpitations.  Gastrointestinal: Negative for abdominal pain, no bowel changes.  Musculoskeletal: Negative for gait problem or joint swelling.  Skin: Negative for rash.  Neurological: Negative for dizziness or headache.  No other specific complaints in a complete review of systems (except as listed in HPI above).      Objective:    BP 124/78   Pulse 96   Temp 97.9 F (36.6 C) (Oral)   Resp 16   Ht 5\' 3"  (1.6 m)   Wt 230 lb 14.4 oz (104.7 kg)   SpO2 99%   BMI 40.90 kg/m   Wt Readings from Last 3  Encounters:  05/18/22 230 lb 14.4 oz (104.7 kg)  10/19/21 202 lb 14.4 oz (92 kg)  09/30/21 207 lb 4.8 oz (94 kg)    Physical Exam  Constitutional: Patient appears well-developed and well-nourished. Obese  No distress.  HEENT: head atraumatic, normocephalic, pupils equal and reactive to light, ears TMs clear, neck supple, throat within normal limits Cardiovascular: Normal rate, regular rhythm and normal heart sounds.  No murmur heard. No BLE edema. Pulmonary/Chest: Effort normal and breath sounds normal. No respiratory distress. Abdominal: Soft.  There is no tenderness. Psychiatric: Patient has a normal mood and affect. behavior is normal. Judgment and thought content normal.      Assessment & Plan:   Problem List Items Addressed This Visit       Other   RESOLVED: Antibiotic-induced yeast infection   Relevant Medications   fluconazole (DIFLUCAN) 150 MG tablet   Other Visit Diagnoses     Acute cough    -  Primary   Start doxycycline, Tussionex and Tessalon Perles.  Fluids continue Mucinex.    Relevant Medications   chlorpheniramine-HYDROcodone (TUSSIONEX) 10-8 MG/5ML   benzonatate (TESSALON) 100 MG capsule   Acute non-recurrent pansinusitis       Start doxycycline continue taking Mucinex can also take Flonase and Zyrtec.   Relevant Medications  chlorpheniramine-HYDROcodone (TUSSIONEX) 10-8 MG/5ML   benzonatate (TESSALON) 100 MG capsule   fluconazole (DIFLUCAN) 150 MG tablet   doxycycline (VIBRA-TABS) 100 MG tablet        Follow up plan: Return if symptoms worsen or fail to improve.

## 2022-05-19 ENCOUNTER — Encounter: Payer: Self-pay | Admitting: Nurse Practitioner

## 2022-05-26 ENCOUNTER — Other Ambulatory Visit: Payer: Self-pay | Admitting: Family Medicine

## 2022-05-26 ENCOUNTER — Other Ambulatory Visit: Payer: Self-pay | Admitting: Nurse Practitioner

## 2022-05-26 DIAGNOSIS — E785 Hyperlipidemia, unspecified: Secondary | ICD-10-CM

## 2022-05-26 MED ORDER — EZETIMIBE 10 MG PO TABS: 10.0000 mg | ORAL_TABLET | Freq: Every day | ORAL | 3 refills | Status: DC

## 2022-08-28 ENCOUNTER — Other Ambulatory Visit: Payer: Self-pay | Admitting: Nurse Practitioner

## 2022-08-28 DIAGNOSIS — E119 Type 2 diabetes mellitus without complications: Secondary | ICD-10-CM

## 2022-08-28 NOTE — Telephone Encounter (Signed)
Requested Prescriptions  Pending Prescriptions Disp Refills   JANUVIA 25 MG tablet [Pharmacy Med Name: JANUVIA 25 MG TABLET] 90 tablet 0    Sig: TAKE 1 TABLET (25 MG TOTAL) BY MOUTH DAILY.     Endocrinology:  Diabetes - DPP-4 Inhibitors Failed - 08/28/2022  1:29 AM      Failed - HBA1C is between 0 and 7.9 and within 180 days    Hgb A1c MFr Bld  Date Value Ref Range Status  09/30/2021 6.1 (H) <5.7 % of total Hgb Final    Comment:    For someone without known diabetes, a hemoglobin  A1c value between 5.7% and 6.4% is consistent with prediabetes and should be confirmed with a  follow-up test. . For someone with known diabetes, a value <7% indicates that their diabetes is well controlled. A1c targets should be individualized based on duration of diabetes, age, comorbid conditions, and other considerations. . This assay result is consistent with an increased risk of diabetes. . Currently, no consensus exists regarding use of hemoglobin A1c for diagnosis of diabetes for children. .          Passed - Cr in normal range and within 360 days    Creat  Date Value Ref Range Status  09/30/2021 0.66 0.50 - 1.03 mg/dL Final   Creatinine, Urine  Date Value Ref Range Status  09/30/2021 118 20 - 275 mg/dL Final         Passed - Valid encounter within last 6 months    Recent Outpatient Visits           3 months ago Acute cough   [place], [name]   4 months ago [facility] Bo Merino, [name]   10 months ago Acute right-sided low back pain with right-sided sciatica   [facility] Bo Merino, [name]   11 months ago Physical exam, annual   [facility] Bo Merino, [name]   1 year ago Mass of upper outer quadrant of left breast   [facility] Bo Merino, [name]

## 2022-11-06 NOTE — Progress Notes (Unsigned)
Name: Elizabeth Scott   MRN: [id number]    DOB: 10/07/1960   Date:11/07/2022       Progress Note  Subjective  Chief Complaint  Chief Complaint  Patient presents with   Annual Exam    HPI  Patient presents for annual CPE.  Diet: she eats lunch and eats late in to the evening.  Well balanced Exercise: active job, she plans to walk when it warms up  Sleep: 5-6  hours Last dental exam:over year ago Last eye exam: 04/2022  Flowsheet Row Office Visit from 11/07/2022 in [facility]  AUDIT-C Score 0      Depression: Phq 9 is  negative    11/07/2022    3:23 PM 05/18/2022    8:06 AM 04/25/2022   11:54 AM 10/19/2021    9:51 AM 09/30/2021    8:29 AM  Depression screen PHQ 2/9  Decreased Interest 0 0 0 0 0  Down, Depressed, Hopeless 0 0 0 0 0  PHQ - 2 Score 0 0 0 0 0  Altered sleeping     0  Tired, decreased energy     0  Change in appetite     0  Feeling bad or failure about yourself      0  Trouble concentrating     0  Moving slowly or fidgety/restless     0  Suicidal thoughts     0  PHQ-9 Score     0  Difficult doing work/chores     Not difficult at all   Hypertension: BP Readings from Last 3 Encounters:  11/07/22 128/72  05/18/22 124/78  10/19/21 (!) 142/80   Obesity: Wt Readings from Last 3 Encounters:  11/07/22 222 lb 4.8 oz (100.8 kg)  05/18/22 230 lb 14.4 oz (104.7 kg)  10/19/21 202 lb 14.4 oz (92 kg)   BMI Readings from Last 3 Encounters:  11/07/22 39.38 kg/m  05/18/22 40.90 kg/m  10/19/21 35.94 kg/m     Vaccines:  HPV: up to at age 66 , ask insurance if age between 6-45  Shingrix: 20-64 yo and ask insurance if covered when patient above 58 yo Pneumonia:  educated and discussed with patient. Flu:  educated and discussed with patient.  Hep C Screening: 10/17/2017 STD testing and prevention (HIV/chl/gon/syphilis): 10/17/2017 Intimate partner violence:none Sexual History : yes Menstrual History/LMP/Abnormal Bleeding:  hysterectomy Incontinence Symptoms: if she holds it too long she may have some leakage.   Breast cancer:  - Last Mammogram: 08/15/2021, due - BRCA gene screening: none  Osteoporosis: Discussed high calcium and vitamin D supplementation, weight bearing exercises  Cervical cancer screening: partial hysterectomy last pap 2019,due  Skin cancer: Discussed monitoring for atypical lesions  Colorectal cancer: 02/22/2014, due   Lung cancer:   Low Dose CT Chest recommended if Age 3-80 years, 20 pack-year currently smoking OR have quit w/in 15years. Patient does not qualify.   ECG: none  Advanced Care Planning: A voluntary discussion about advance care planning including the explanation and discussion of advance directives.  Discussed health care proxy and Living will, and the patient was able to identify a health care proxy as husband.  Patient does not have a living will at present time. If patient does have living will, I have requested they bring this to the clinic to be scanned in to their chart.  Lipids: Lab Results  Component Value Date   CHOL 128 09/30/2021   CHOL 164 02/28/2021   CHOL 126 12/30/2019  Lab Results  Component Value Date   HDL 45 (L) 09/30/2021   HDL 48 (L) 02/28/2021   HDL 42 (L) 12/30/2019   Lab Results  Component Value Date   LDLCALC 69 09/30/2021   LDLCALC 96 02/28/2021   LDLCALC 68 12/30/2019   Lab Results  Component Value Date   TRIG 63 09/30/2021   TRIG 105 02/28/2021   TRIG 78 12/30/2019   Lab Results  Component Value Date   CHOLHDL 2.8 09/30/2021   CHOLHDL 3.4 02/28/2021   CHOLHDL 3.0 12/30/2019   No results found for: "LDLDIRECT"  Glucose: Glucose, Bld  Date Value Ref Range Status  09/30/2021 115 (H) 65 - 99 mg/dL Final    Comment:    .            Fasting reference interval . For someone without known diabetes, a glucose value between 100 and 125 mg/dL is consistent with prediabetes and should be confirmed with a follow-up test. .    02/28/2021 107 (H) 65 - 99 mg/dL Final    Comment:    .            Fasting reference interval . For someone without known diabetes, a glucose value between 100 and 125 mg/dL is consistent with prediabetes and should be confirmed with a follow-up test. .   04/07/2020 105 (H) 65 - 99 mg/dL Final    Comment:    .            Fasting reference interval . For someone without known diabetes, a glucose value between 100 and 125 mg/dL is consistent with prediabetes and should be confirmed with a follow-up test. .     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   Walnut allergy 08/30/2015   Preventative health care 08/30/2015   Type 2 diabetes mellitus without complication, without long-term current use of insulin 04/27/2015   Hyperlipidemia 04/27/2015   Essential hypertension, benign 04/27/2015   Microcytosis 04/27/2015   Class 2 severe obesity with serious comorbidity and body mass index (BMI) of 37.0 to 37.9 in adult 04/27/2015   Gastroesophageal reflux disease without esophagitis 01/08/2015    Past Surgical History:  Procedure Laterality Date   ABDOMINAL HYSTERECTOMY  2003   still has ovaries-for menorrhagia   CESAREAN SECTION     COLONOSCOPY  02/22/14   repeat 5 years   ESOPHAGOGASTRODUODENOSCOPY  02/22/14   TUBAL LIGATION      Family History  Problem Relation Age of Onset   Diabetes Mother    Heart disease Mother    Hypertension Mother    Stroke Mother    Diabetes Father    Hypertension Father    Asthma Sister    Diabetes Sister    Hypertension Sister    Lung disease Sister    Hypertension Brother    Heart  disease Brother    Stroke Brother    Asthma Sister    Diabetes Sister    Hypertension Sister    Congestive Heart Failure Maternal Grandmother    Cancer Paternal Grandfather        stomach cancer   COPD Neg Hx     Social  History   Socioeconomic History   Marital status: Married    Spouse name: Not on file   Number of children: Not on file   Years of education: Not on file   Highest education level: Not on file  Occupational History   Not on file  Tobacco Use   Smoking status: Never   Smokeless tobacco: Never  Vaping Use   Vaping Use: Never used  Substance and Sexual Activity   Alcohol use: No   Drug use: No   Sexual activity: Yes  Other Topics Concern   Not on file  Social History Narrative   Not on file   Social Determinants of Health   Financial Resource Strain: Low Risk  (11/07/2022)   Overall Financial Resource Strain (CARDIA)    Difficulty of Paying Living Expenses: Not hard at all  Food Insecurity: No Food Insecurity (11/07/2022)   Hunger Vital Sign    Worried About Running Out of Food in the Last Year: Never true    Ran Out of Food in the Last Year: Never true  Transportation Needs: No Transportation Needs (11/07/2022)   PRAPARE - Administrator, Civil Service (Medical): No    Lack of Transportation (Non-Medical): No  Physical Activity: Inactive (11/07/2022)   Exercise Vital Sign    Days of Exercise per Week: 0 days    Minutes of Exercise per Session: 0 min  Stress: No Stress Concern Present (11/07/2022)   [employer] of Occupational Health - Occupational Stress Questionnaire    Feeling of Stress : Only a little  Social Connections: Socially Integrated (11/07/2022)   Social Connection and Isolation Panel [NHANES]    Frequency of Communication with Friends and Family: More than three times a week    Frequency of Social Gatherings with Friends and Family: More than three times a week    Attends Religious Services: More than 4 times per year     Active Member of [employer] or Organizations: No    Attends Engineer, structural: More than 4 times per year    Marital Status: Married  Catering manager Violence: Not At Risk (11/07/2022)   Humiliation, Afraid, Rape, and Kick questionnaire    Fear of Current or Ex-Partner: No    Emotionally Abused: No    Physically Abused: No    Sexually Abused: No     Current Outpatient Medications:    albuterol (PROVENTIL HFA;VENTOLIN HFA) 108 (90 BASE) MCG/ACT inhaler, Inhale 2 puffs into the lungs every 2 (two) hours as needed for wheezing or shortness of breath., Disp: , Rfl:    amLODipine (NORVASC) 5 MG tablet, Take 1 tablet (5 mg total) by mouth daily., Disp: 90 tablet, Rfl: 3   aspirin 325 MG tablet, , Disp: , Rfl:    atorvastatin (LIPITOR) 80 MG tablet, TAKE 1 TABLET BY MOUTH EVERY DAY, Disp: 90 tablet, Rfl: 2   chlorpheniramine-HYDROcodone (TUSSIONEX) 10-8 MG/5ML, Take 5 mLs by mouth 2 (two) times daily., Disp: 115 mL, Rfl: 0   ezetimibe (ZETIA) 10 MG tablet, Take 1 tablet (10 mg total) by mouth daily., Disp: 90 tablet, Rfl: 3   glipiZIDE (GLUCOTROL XL) 10 MG 24 hr tablet, TAKE 1 TABLET BY MOUTH TWICE A DAY, Disp: 180 tablet, Rfl: 3   JANUVIA 25 MG tablet, TAKE 1 TABLET (25 MG TOTAL) BY MOUTH DAILY., Disp: 90 tablet, Rfl: 0   losartan (COZAAR) 50 MG tablet, TAKE 1 TABLET BY MOUTH EVERY DAY, Disp: 90 tablet, Rfl: 0  methocarbamol (ROBAXIN) 500 MG tablet, Take 1 tablet (500 mg total) by mouth 2 (two) times daily as needed for muscle spasms., Disp: 20 tablet, Rfl: 0   omeprazole (PRILOSEC) 20 MG capsule, Take 20 mg by mouth daily., Disp: , Rfl:    pioglitazone (ACTOS) 15 MG tablet, TAKE 1 TABLET (15 MG TOTAL) BY MOUTH DAILY., Disp: 90 tablet, Rfl: 3   benzonatate (TESSALON) 100 MG capsule, Take 2 capsules (200 mg total) by mouth 3 (three) times daily as needed for cough. (Patient not taking: Reported on 11/07/2022), Disp: 30 capsule, Rfl: 0   fluconazole (DIFLUCAN) 150 MG tablet, Take 1 tablet (150  mg total) by mouth every 3 (three) days as needed (for vaginal itching/yeast infection sx). (Patient not taking: Reported on 11/07/2022), Disp: 2 tablet, Rfl: 0  Allergies  Allergen Reactions   Metformin And Related Other (See Comments)    Rash, Thrush    Other     walnuts     ROS  Constitutional: Negative for fever or weight change.  Respiratory: Negative for cough and shortness of breath.   Cardiovascular: Negative for chest pain or palpitations.  Gastrointestinal: Negative for abdominal pain, no bowel changes.  Musculoskeletal: Negative for gait problem or joint swelling.  Skin: Negative for rash.  Neurological: Negative for dizziness or headache.  No other specific complaints in a complete review of systems (except as listed in HPI above).   Objective  Vitals:   11/07/22 1522  BP: 128/72  Pulse: 83  Resp: 16  Temp: 97.9 F (36.6 C)  TempSrc: Oral  SpO2: 98%  Weight: 222 lb 4.8 oz (100.8 kg)  Height: 5\' 3"  (1.6 m)    Body mass index is 39.38 kg/m.  Physical Exam  Constitutional: Patient appears well-developed and well-nourished. No distress.  HENT: Head: Normocephalic and atraumatic. Ears: B TMs ok, no erythema or effusion; Nose: Nose normal. Mouth/Throat: Oropharynx is clear and moist. No oropharyngeal exudate.  Eyes: Conjunctivae and EOM are normal. Pupils are equal, round, and reactive to light. No scleral icterus.  Neck: Normal range of motion. Neck supple. No JVD present. No thyromegaly present.  Cardiovascular: Normal rate, regular rhythm and normal heart sounds.  No murmur heard. No BLE edema. Pulmonary/Chest: Effort normal and breath sounds normal. No respiratory distress. Abdominal: Soft. Bowel sounds are normal, no distension. There is no tenderness. no masses Breast: no lumps or masses, no nipple discharge or rashes FEMALE GENITALIA:  External genitalia normal External urethra normal Vaginal vault normal without discharge or lesions Cervix normal  without discharge or lesions Bimanual exam normal without masses RECTAL: no rectal masses or hemorrhoids Musculoskeletal: Normal range of motion, no joint effusions. No gross deformities Neurological: he is alert and oriented to person, place, and time. No cranial nerve deficit. Coordination, balance, strength, speech and gait are normal.  Skin: Skin is warm and dry. No rash noted. No erythema.  Psychiatric: Patient has a normal mood and affect. behavior is normal. Judgment and thought content normal.   No results found for this or any previous visit (from the past 2160 hour(s)).  Diabetic Foot Exam: Diabetic Foot Exam - Simple   Simple Foot Form Diabetic Foot exam was performed with the following findings: Yes 11/07/2022  3:36 PM  Visual Inspection No deformities, no ulcerations, no other skin breakdown bilaterally: Yes Sensation Testing Intact to touch and monofilament testing bilaterally: Yes Pulse Check Posterior Tibialis and Dorsalis pulse intact bilaterally: Yes Comments      Fall Risk:  11/07/2022    3:23 PM 05/18/2022    8:05 AM 04/25/2022   11:54 AM 10/19/2021    9:50 AM 09/30/2021    8:29 AM  Fall Risk   Falls in the past year? 0 0 0 0 0  Number falls in past yr: 0 0 0 0 0  Injury with Fall? 0 0 0 0 0  Follow up  Falls evaluation completed Falls evaluation completed Falls evaluation completed Falls evaluation completed     Functional Status Survey: Is the patient deaf or have difficulty hearing?: No Does the patient have difficulty seeing, even when wearing glasses/contacts?: No Does the patient have difficulty concentrating, remembering, or making decisions?: No Does the patient have difficulty walking or climbing stairs?: No Does the patient have difficulty dressing or bathing?: No Does the patient have difficulty doing errands alone such as visiting a doctor's office or shopping?: No  Assessment & Plan  1. Annual physical exam  - COMPLETE METABOLIC PANEL  WITH GFR - CBC with Differential/Platelet - Lipid panel - Hemoglobin A1c - Microalbumin / creatinine urine ratio - MM 3D SCREENING MAMMOGRAM BILATERAL BREAST; Future - HM Diabetes Foot Exam - Ambulatory referral to Gastroenterology - Cytology - PAP  2. Hyperlipidemia, unspecified hyperlipidemia type  - COMPLETE METABOLIC PANEL WITH GFR - Lipid panel  3. Type 2 diabetes mellitus without complication, without long-term current use of insulin  - COMPLETE METABOLIC PANEL WITH GFR - Hemoglobin A1c - Microalbumin / creatinine urine ratio - HM Diabetes Foot Exam  4. Essential hypertension, benign  - COMPLETE METABOLIC PANEL WITH GFR - CBC with Differential/Platelet  5. Screening for colon cancer  - Ambulatory referral to Gastroenterology  6. Encounter for screening mammogram for malignant neoplasm of breast  - MM 3D SCREENING MAMMOGRAM BILATERAL BREAST; Future  7. Screening for cervical cancer  - Cytology - PAP   -USPSTF grade A and B recommendations reviewed with patient; age-appropriate recommendations, preventive care, screening tests, etc discussed and encouraged; healthy living encouraged; see AVS for patient education given to patient -Discussed importance of 150 minutes of physical activity weekly, eat two servings of fish weekly, eat one serving of tree nuts ( cashews, pistachios, pecans, almonds.Marland Kitchen.) every other day, eat 6 servings of fruit/vegetables daily and drink plenty of water and avoid sweet beverages.

## 2022-11-07 ENCOUNTER — Other Ambulatory Visit: Payer: Self-pay

## 2022-11-07 ENCOUNTER — Encounter: Payer: Self-pay | Admitting: Nurse Practitioner

## 2022-11-07 ENCOUNTER — Ambulatory Visit (INDEPENDENT_AMBULATORY_CARE_PROVIDER_SITE_OTHER): Payer: No Typology Code available for payment source | Admitting: Nurse Practitioner

## 2022-11-07 ENCOUNTER — Other Ambulatory Visit (HOSPITAL_COMMUNITY)
Admission: RE | Admit: 2022-11-07 | Discharge: 2022-11-07 | Disposition: A | Discharge status: Home / Self Care | Payer: No Typology Code available for payment source | Source: Ambulatory Visit | Attending: Nurse Practitioner | Admitting: Nurse Practitioner

## 2022-11-07 VITALS — BP 128/72 | HR 83 | Temp 97.9°F | Resp 16 | Ht 63.0 in | Wt 222.3 lb

## 2022-11-07 DIAGNOSIS — E785 Hyperlipidemia, unspecified: Secondary | ICD-10-CM

## 2022-11-07 DIAGNOSIS — E119 Type 2 diabetes mellitus without complications: Secondary | ICD-10-CM | POA: Diagnosis not present

## 2022-11-07 DIAGNOSIS — Z124 Encounter for screening for malignant neoplasm of cervix: Secondary | ICD-10-CM

## 2022-11-07 DIAGNOSIS — Z0000 Encounter for general adult medical examination without abnormal findings: Secondary | ICD-10-CM

## 2022-11-07 DIAGNOSIS — I10 Essential (primary) hypertension: Secondary | ICD-10-CM

## 2022-11-07 DIAGNOSIS — Z1231 Encounter for screening mammogram for malignant neoplasm of breast: Secondary | ICD-10-CM

## 2022-11-07 DIAGNOSIS — Z1211 Encounter for screening for malignant neoplasm of colon: Secondary | ICD-10-CM

## 2022-11-08 ENCOUNTER — Telehealth: Payer: Self-pay | Admitting: *Deleted

## 2022-11-08 LAB — COMPLETE METABOLIC PANEL WITH GFR
AG Ratio: 1.5 (calc) (ref 1.0–2.5)
ALT: 12 U/L (ref 6–29)
AST: 12 U/L (ref 10–35)
Albumin: 4.1 g/dL (ref 3.6–5.1)
Alkaline phosphatase (APISO): 89 U/L (ref 37–153)
BUN: 17 mg/dL (ref 7–25)
CO2: 26 mmol/L (ref 20–32)
Calcium: 9.4 mg/dL (ref 8.6–10.4)
Chloride: 103 mmol/L (ref 98–110)
Creat: 0.74 mg/dL (ref 0.50–1.05)
Globulin: 2.8 g/dL (calc) (ref 1.9–3.7)
Glucose, Bld: 104 mg/dL — ABNORMAL HIGH (ref 65–99)
Potassium: 3.5 mmol/L (ref 3.5–5.3)
Sodium: 140 mmol/L (ref 135–146)
Total Bilirubin: 0.9 mg/dL (ref 0.2–1.2)
Total Protein: 6.9 g/dL (ref 6.1–8.1)
eGFR: 93 mL/min/{1.73_m2} (ref >=60)

## 2022-11-08 LAB — CBC WITH DIFFERENTIAL/PLATELET
Absolute Monocytes: 611 cells/uL (ref 200–950)
Basophils Absolute: 58 cells/uL (ref 0–200)
Basophils Relative: 0.6 %
Eosinophils Absolute: 87 cells/uL (ref 15–500)
Eosinophils Relative: 0.9 %
HCT: 38.5 % (ref 35.0–45.0)
Hemoglobin: 12.3 g/dL (ref 11.7–15.5)
Lymphs Abs: 2221 cells/uL (ref 850–3900)
MCH: 24 pg — ABNORMAL LOW (ref 27.0–33.0)
MCHC: 31.9 g/dL — ABNORMAL LOW (ref 32.0–36.0)
MCV: 75.2 fL — ABNORMAL LOW (ref 80.0–100.0)
MPV: 12.5 fL (ref 7.5–12.5)
Monocytes Relative: 6.3 %
Neutro Abs: 6722 cells/uL (ref 1500–7800)
Neutrophils Relative %: 69.3 %
Platelets: 219 10*3/uL (ref 140–400)
RBC: 5.12 10*6/uL — ABNORMAL HIGH (ref 3.80–5.10)
RDW: 17.1 % — ABNORMAL HIGH (ref 11.0–15.0)
Total Lymphocyte: 22.9 %
WBC: 9.7 10*3/uL (ref 3.8–10.8)

## 2022-11-08 LAB — HEMOGLOBIN A1C
Hgb A1c MFr Bld: 8.4 % of total Hgb — ABNORMAL HIGH (ref <5.7)
Mean Plasma Glucose: 194 mg/dL
eAG (mmol/L): 10.8 mmol/L

## 2022-11-08 LAB — LIPID PANEL
Cholesterol: 231 mg/dL — ABNORMAL HIGH (ref <200)
HDL: 45 mg/dL — ABNORMAL LOW (ref >=50)
LDL Cholesterol (Calc): 157 mg/dL (calc) — ABNORMAL HIGH
Non-HDL Cholesterol (Calc): 186 mg/dL (calc) — ABNORMAL HIGH (ref <130)
Total CHOL/HDL Ratio: 5.1 (calc) — ABNORMAL HIGH (ref <5.0)
Triglycerides: 159 mg/dL — ABNORMAL HIGH (ref <150)

## 2022-11-08 LAB — MICROALBUMIN / CREATININE URINE RATIO
Creatinine, Urine: 118 mg/dL (ref 20–275)
Microalb Creat Ratio: 9 mg/g creat (ref <30)
Microalb, Ur: 1.1 mg/dL

## 2022-11-08 NOTE — Telephone Encounter (Signed)
Patient returned call and notified: Elizabeth Scott, your cholesterol has significantly gotten worse since it was last checked.  Are you taking the atorvastatin every day? Your kidney and liver function are normal.  Your A1C which checks your diabetes is much worse and shows that your diabetes is not well controlled.  Are you taking pioglitazone, januvia, and glipizide?  If so we need to change your medication.     Patient states she is not regular with her cholesterol medication at all- she will start taking daily.  Patient states she is more regular with her diabetic medications- but sometimes forgets the night time dosing- she will try to do better here also.

## 2022-11-10 LAB — CYTOLOGY - PAP
Chlamydia: NEGATIVE
Comment: NEGATIVE
Comment: NEGATIVE
Comment: NORMAL
Diagnosis: NEGATIVE
Diagnosis: REACTIVE
High risk HPV: NEGATIVE
Neisseria Gonorrhea: NEGATIVE

## 2022-11-14 ENCOUNTER — Other Ambulatory Visit: Payer: Self-pay | Admitting: Nurse Practitioner

## 2022-11-14 DIAGNOSIS — Z1211 Encounter for screening for malignant neoplasm of colon: Secondary | ICD-10-CM

## 2022-12-02 ENCOUNTER — Emergency Department (HOSPITAL_COMMUNITY)
Admission: EM | Admit: 2022-12-02 | Discharge: 2022-12-02 | Disposition: A | Discharge status: Home / Self Care | Payer: No Typology Code available for payment source | Attending: Emergency Medicine | Admitting: Emergency Medicine

## 2022-12-02 ENCOUNTER — Other Ambulatory Visit: Payer: Self-pay

## 2022-12-02 ENCOUNTER — Encounter (HOSPITAL_COMMUNITY): Payer: Self-pay

## 2022-12-02 DIAGNOSIS — I10 Essential (primary) hypertension: Secondary | ICD-10-CM | POA: Diagnosis not present

## 2022-12-02 DIAGNOSIS — R739 Hyperglycemia, unspecified: Secondary | ICD-10-CM

## 2022-12-02 DIAGNOSIS — R519 Headache, unspecified: Secondary | ICD-10-CM | POA: Insufficient documentation

## 2022-12-02 DIAGNOSIS — E1165 Type 2 diabetes mellitus with hyperglycemia: Secondary | ICD-10-CM | POA: Insufficient documentation

## 2022-12-02 DIAGNOSIS — Z79899 Other long term (current) drug therapy: Secondary | ICD-10-CM | POA: Diagnosis not present

## 2022-12-02 DIAGNOSIS — Z7984 Long term (current) use of oral hypoglycemic drugs: Secondary | ICD-10-CM | POA: Diagnosis not present

## 2022-12-02 LAB — BASIC METABOLIC PANEL
Anion gap: 9 (ref 5–15)
BUN: 9 mg/dL (ref 6–20)
CO2: 25 mmol/L (ref 22–32)
Calcium: 9.1 mg/dL (ref 8.9–10.3)
Chloride: 99 mmol/L (ref 98–111)
Creatinine, Ser: 0.62 mg/dL (ref 0.44–1.00)
GFR, Estimated: >60 mL/min (ref >60)
Glucose, Bld: 247 mg/dL — ABNORMAL HIGH (ref 70–99)
Potassium: 3.3 mmol/L — ABNORMAL LOW (ref 3.5–5.1)
Sodium: 133 mmol/L — ABNORMAL LOW (ref 135–145)

## 2022-12-02 LAB — CBC WITH DIFFERENTIAL/PLATELET
Abs Immature Granulocytes: 0.04 10*3/uL (ref 0.00–0.07)
Basophils Absolute: 0.1 10*3/uL (ref 0.0–0.1)
Basophils Relative: 1 %
Eosinophils Absolute: 0.1 10*3/uL (ref 0.0–0.5)
Eosinophils Relative: 1 %
HCT: 40.8 % (ref 36.0–46.0)
Hemoglobin: 13.9 g/dL (ref 12.0–15.0)
Immature Granulocytes: 0 %
Lymphocytes Relative: 11 %
Lymphs Abs: 1.4 10*3/uL (ref 0.7–4.0)
MCH: 24.7 pg — ABNORMAL LOW (ref 26.0–34.0)
MCHC: 34.1 g/dL (ref 30.0–36.0)
MCV: 72.5 fL — ABNORMAL LOW (ref 80.0–100.0)
Monocytes Absolute: 0.8 10*3/uL (ref 0.1–1.0)
Monocytes Relative: 6 %
Neutro Abs: 10.4 10*3/uL — ABNORMAL HIGH (ref 1.7–7.7)
Neutrophils Relative %: 81 %
Platelets: 248 10*3/uL (ref 150–400)
RBC: 5.63 MIL/uL — ABNORMAL HIGH (ref 3.87–5.11)
RDW: 16 % — ABNORMAL HIGH (ref 11.5–15.5)
WBC: 12.8 10*3/uL — ABNORMAL HIGH (ref 4.0–10.5)
nRBC: 0 % (ref 0.0–0.2)

## 2022-12-02 LAB — CBG MONITORING, ED: Glucose-Capillary: 275 mg/dL — ABNORMAL HIGH (ref 70–99)

## 2022-12-02 MED ORDER — KETOROLAC TROMETHAMINE 30 MG/ML IJ SOLN
30.0000 mg | Freq: Once | INTRAMUSCULAR | Status: AC
  Administered 2022-12-02: 30 mg via INTRAVENOUS
  Filled 2022-12-02: qty 1

## 2022-12-02 MED ORDER — POTASSIUM CHLORIDE CRYS ER 20 MEQ PO TBCR
40.0000 meq | EXTENDED_RELEASE_TABLET | Freq: Once | ORAL | Status: AC
  Administered 2022-12-02: 40 meq via ORAL
  Filled 2022-12-02: qty 2

## 2022-12-02 MED ORDER — DIPHENHYDRAMINE HCL 50 MG/ML IJ SOLN
25.0000 mg | Freq: Once | INTRAMUSCULAR | Status: AC
  Administered 2022-12-02: 25 mg via INTRAVENOUS
  Filled 2022-12-02: qty 1

## 2022-12-02 MED ORDER — METOCLOPRAMIDE HCL 5 MG/ML IJ SOLN
10.0000 mg | Freq: Once | INTRAMUSCULAR | Status: AC
  Administered 2022-12-02: 10 mg via INTRAVENOUS
  Filled 2022-12-02: qty 2

## 2022-12-02 MED ORDER — SODIUM CHLORIDE 0.9 % IV BOLUS
1000.0000 mL | Freq: Once | INTRAVENOUS | Status: AC
  Administered 2022-12-02: 1000 mL via INTRAVENOUS

## 2022-12-02 NOTE — ED Triage Notes (Addendum)
Pt presents with HA without photophobia, sinus pressure, and elevated CBG that started last night. Pt states her CBG this AM was 298.

## 2022-12-02 NOTE — ED Provider Notes (Signed)
[place] EMERGENCY DEPARTMENT AT [facility] Provider Note   CSN: [id number] Arrival date & time: 12/02/22  1153     History  No chief complaint on file.   Armani Gieseking is a 62 y.o. female.  HPI     Trenia Golinski is a 62 y.o. female past medical history of type 2 diabetes, hypertension, iron deficient anemia who presents to the Emergency Department complaining of waxing and waning left-sided headache x 1 week.  She describes the headache as a throbbing, pounding sensation to the left forehead and left temple area.  Pain radiates behind her left eye.  States headache is similar to previous headaches and was gradual in onset.  Intermittent "floaters" with some nausea as well.  No vomiting fever or chills.  No neck stiffness, chest pain or shortness of breath.  Has tried over-the-counter pain relievers without improvement. Also states that her blood sugar has been elevated for several days.  Missed 2 days of her antidiabetic medications earlier this week.  States her sugar at home was greater than 200.  Home Medications Prior to Admission medications   Medication Sig Start Date End Date Taking? Authorizing Provider  albuterol (PROVENTIL HFA;VENTOLIN HFA) 108 (90 BASE) MCG/ACT inhaler Inhale 2 puffs into the lungs every 2 (two) hours as needed for wheezing or shortness of breath.    [provider]  amLODipine (NORVASC) 5 MG tablet Take 1 tablet (5 mg total) by mouth daily. 03/31/22   Berniece Salines, [name]  aspirin 325 MG tablet  08/23/21   [provider]  atorvastatin (LIPITOR) 80 MG tablet TAKE 1 TABLET BY MOUTH EVERY DAY 03/07/22   Berniece Salines, [name]  benzonatate (TESSALON) 100 MG capsule Take 2 capsules (200 mg total) by mouth 3 (three) times daily as needed for cough. Patient not taking: Reported on 11/07/2022 05/18/22   Berniece Salines, [name]  chlorpheniramine-HYDROcodone (TUSSIONEX) 10-8 MG/5ML Take 5 mLs by mouth 2 (two) times daily.  05/18/22   Berniece Salines, [name]  ezetimibe (ZETIA) 10 MG tablet Take 1 tablet (10 mg total) by mouth daily. 05/26/22   Berniece Salines, [name]  fluconazole (DIFLUCAN) 150 MG tablet Take 1 tablet (150 mg total) by mouth every 3 (three) days as needed (for vaginal itching/yeast infection sx). Patient not taking: Reported on 11/07/2022 05/18/22   Berniece Salines, [name]  glipiZIDE (GLUCOTROL XL) 10 MG 24 hr tablet TAKE 1 TABLET BY MOUTH TWICE A DAY 03/10/22   Berniece Salines, [name]  JANUVIA 25 MG tablet TAKE 1 TABLET (25 MG TOTAL) BY MOUTH DAILY. 08/28/22   Berniece Salines, [name]  losartan (COZAAR) 50 MG tablet TAKE 1 TABLET BY MOUTH EVERY DAY 03/07/22   Berniece Salines, [name]  methocarbamol (ROBAXIN) 500 MG tablet Take 1 tablet (500 mg total) by mouth 2 (two) times daily as needed for muscle spasms. 10/19/21   Berniece Salines, [name]  omeprazole (PRILOSEC) 20 MG capsule Take 20 mg by mouth daily.    [provider]  pioglitazone (ACTOS) 15 MG tablet TAKE 1 TABLET (15 MG TOTAL) BY MOUTH DAILY. 03/07/22   Berniece Salines, [name]      Allergies    Metformin and related and Other    Review of Systems   Review of Systems  Constitutional:  Negative for chills and fever.  HENT:  Positive for sinus pressure and sinus pain. Negative for congestion, ear pain, sore throat and trouble swallowing.   Eyes:  Negative for photophobia, pain and visual disturbance.  Respiratory:  Negative for cough and shortness of breath.   Cardiovascular:  Negative for chest pain.  Gastrointestinal:  Positive for nausea. Negative for abdominal pain, diarrhea and vomiting.  Genitourinary:  Negative for difficulty urinating, dysuria and flank pain.  Musculoskeletal:  Negative for neck pain and neck stiffness.  Skin:  Negative for rash and wound.  Neurological:  Positive for headaches. Negative for dizziness, syncope, weakness and numbness.  Psychiatric/Behavioral:  Negative for confusion.     Physical Exam Updated Vital Signs BP (!)  185/82 (BP Location: Right Arm)   Pulse 86   Temp 98 F (36.7 C) (Oral)   Resp 17   Ht 5\' 3"  (1.6 m)   Wt 99.8 kg   SpO2 98%   BMI 38.97 kg/m  Physical Exam Vitals and nursing note reviewed.  Constitutional:      General: She is not in acute distress.    Appearance: Normal appearance. She is not toxic-appearing.  HENT:     Right Ear: Tympanic membrane and ear canal normal.     Left Ear: Tympanic membrane and ear canal normal.     Mouth/Throat:     Mouth: Mucous membranes are moist.     Pharynx: Oropharynx is clear.  Eyes:     Extraocular Movements: Extraocular movements intact.     Conjunctiva/sclera: Conjunctivae normal.     Pupils: Pupils are equal, round, and reactive to light.  Neck:     Trachea: Phonation normal.     Meningeal: Kernig's sign absent.  Cardiovascular:     Rate and Rhythm: Normal rate and regular rhythm.     Pulses: Normal pulses.  Pulmonary:     Effort: Pulmonary effort is normal.     Breath sounds: Normal breath sounds.  Abdominal:     General: There is no distension.     Palpations: Abdomen is soft.     Tenderness: There is no abdominal tenderness.  Musculoskeletal:        General: No swelling.     Cervical back: Full passive range of motion without pain and normal range of motion. No rigidity or tenderness.     Right lower leg: No edema.     Left lower leg: No edema.  Skin:    General: Skin is warm.     Capillary Refill: Capillary refill takes less than 2 seconds.     Findings: No rash.  Neurological:     General: No focal deficit present.     Mental Status: She is alert.     GCS: GCS eye subscore is 4. GCS verbal subscore is 5. GCS motor subscore is 6.     Sensory: Sensation is intact. No sensory deficit.     Motor: Motor function is intact. No weakness.     Coordination: Coordination is intact. Coordination normal.     Comments: CN II through XII intact.  Speech clear.  Mentating well.  No pronator drift, 5 out of 5 motor strength in  bilateral upper and lower extremities     ED Results / Procedures / Treatments   Labs (all labs ordered are listed, but only abnormal results are displayed) Labs Reviewed  CBC WITH DIFFERENTIAL/PLATELET - Abnormal; Notable for the following components:      Result Value   WBC 12.8 (*)    RBC 5.63 (*)    MCV 72.5 (*)    MCH 24.7 (*)    RDW 16.0 (*)  Neutro Abs 10.4 (*)    All other components within normal limits  BASIC METABOLIC PANEL - Abnormal; Notable for the following components:   Sodium 133 (*)    Potassium 3.3 (*)    Glucose, Bld 247 (*)    All other components within normal limits  CBG MONITORING, ED - Abnormal; Notable for the following components:   Glucose-Capillary 275 (*)    All other components within normal limits  URINALYSIS, ROUTINE W REFLEX MICROSCOPIC    EKG None  Radiology No results found.  Procedures Procedures    Medications Ordered in ED Medications  sodium chloride 0.9 % bolus 1,000 mL (has no administration in time range)  ketorolac (TORADOL) 30 MG/ML injection 30 mg (has no administration in time range)  diphenhydrAMINE (BENADRYL) injection 25 mg (has no administration in time range)  metoCLOPramide (REGLAN) injection 10 mg (has no administration in time range)    ED Course/ Medical Decision Making/ A&P                             Medical Decision Making Patient with history of diabetes here with complaint of intermittent headache x 1 week.  Gradual in onset.  Headache mostly left-sided from forehead to temple and behind left eye.  No nuchal rigidity.  No focal neurodeficits on exam.  Differential would include but not limited to migraine headache, atypical migraine, ocular headache tension headache, sinus headache viral process, meningitis, subarachnoid hemorrhage.  Patient does endorse sinus pressure and pain.  Low clinical suspicion for meningitis or subarachnoid hemorrhage.  Amount and/or Complexity of Data Reviewed Labs:  ordered.    Details: Labs interpreted by me, no significant leukocytosis, chemistries show mild hypokalemia with potassium of 3.3.  Blood sugar 247.  Blood sugar on arrival CBG was 275.  Patient has received IV fluids with improvement of her blood sugar anion gap reassuring. Discussion of management or test interpretation with external provider(s): Patient has received oral potassium and IV fluids here, migraine cocktail as well.  On recheck, blood sugar improving now 247.  She is scheduled to take her evening dose of her antidiabetes medication when she returns home.  She notes improvement of her headache and feels ready for d/c home.  No focal neurodeficits on exam, no nuchal rigidity. I feel she is appropriate for discharge home and she will follow-up with her PCP next week regarding her blood sugar and potassium levels.  Return precautions were discussed  Risk Prescription drug management.           Final Clinical Impression(s) / ED Diagnoses Final diagnoses:  Nonintractable episodic headache, unspecified headache type  Hyperglycemia    Rx / DC Orders ED Discharge Orders     None         Pauline Aus, [name] 12/02/22 1621    Cathren Laine, MD 12/03/22 [phone]

## 2022-12-02 NOTE — Discharge Instructions (Signed)
Your blood sugar today was elevated.  Please take your diabetes medications as directed every day, do not miss doses.  Please follow-up with your primary care provider next week for recheck.  Your potassium today was slightly low.  You have been given oral potassium here.  Recommend having your potassium rechecked next week as well.  Please follow-up with your primary care provider for recheck.  Return emergency department for any new or worsening symptoms.  You may take over-the-counter Claritin or Zyrtec daily and over-the-counter Flonase as needed for allergy/sinus symptoms

## 2022-12-04 ENCOUNTER — Ambulatory Visit (INDEPENDENT_AMBULATORY_CARE_PROVIDER_SITE_OTHER): Payer: No Typology Code available for payment source | Admitting: Physician Assistant

## 2022-12-04 ENCOUNTER — Encounter: Payer: Self-pay | Admitting: Physician Assistant

## 2022-12-04 ENCOUNTER — Ambulatory Visit: Payer: BC Managed Care – PPO | Admitting: Family Medicine

## 2022-12-04 VITALS — BP 146/84 | HR 88 | Temp 97.8°F | Resp 16 | Ht 63.0 in | Wt 227.2 lb

## 2022-12-04 DIAGNOSIS — E119 Type 2 diabetes mellitus without complications: Secondary | ICD-10-CM | POA: Diagnosis not present

## 2022-12-04 DIAGNOSIS — R42 Dizziness and giddiness: Secondary | ICD-10-CM

## 2022-12-04 DIAGNOSIS — Z7984 Long term (current) use of oral hypoglycemic drugs: Secondary | ICD-10-CM

## 2022-12-04 DIAGNOSIS — Z91138 Patient's unintentional underdosing of medication regimen for other reason: Secondary | ICD-10-CM

## 2022-12-04 DIAGNOSIS — R519 Headache, unspecified: Secondary | ICD-10-CM

## 2022-12-04 DIAGNOSIS — R110 Nausea: Secondary | ICD-10-CM

## 2022-12-04 DIAGNOSIS — R319 Hematuria, unspecified: Secondary | ICD-10-CM

## 2022-12-04 LAB — CBC WITH DIFFERENTIAL/PLATELET
Basophils Relative: 0.5 %
HCT: 37.4 % (ref 35.0–45.0)
Hemoglobin: 12.1 g/dL (ref 11.7–15.5)
MCHC: 32.4 g/dL (ref 32.0–36.0)
Neutrophils Relative %: 76.3 %
RBC: 4.96 10*6/uL (ref 3.80–5.10)
Total Lymphocyte: 15.8 %

## 2022-12-04 LAB — POCT URINALYSIS DIPSTICK
Glucose, UA: NEGATIVE
Ketones, UA: NEGATIVE
Nitrite, UA: NEGATIVE
Protein, UA: NEGATIVE
Spec Grav, UA: 1.01 (ref 1.010–1.025)
Urobilinogen, UA: 0.2 E.U./dL
pH, UA: 5 (ref 5.0–8.0)

## 2022-12-04 MED ORDER — LANCETS MISC. MISC
1.0000 | Freq: Three times a day (TID) | 1 refills | Status: DC
Start: 2022-12-04 — End: 2022-12-07

## 2022-12-04 MED ORDER — LANCET DEVICE MISC
1.0000 | Freq: Three times a day (TID) | 0 refills | Status: DC
Start: 2022-12-04 — End: 2022-12-07

## 2022-12-04 MED ORDER — BLOOD GLUCOSE MONITORING SUPPL DEVI
1.0000 | Freq: Two times a day (BID) | 0 refills | Status: DC
Start: 2022-12-04 — End: 2022-12-07

## 2022-12-04 MED ORDER — BLOOD GLUCOSE TEST VI STRP
1.0000 | ORAL_STRIP | Freq: Two times a day (BID) | 1 refills | Status: DC
Start: 2022-12-04 — End: 2022-12-07

## 2022-12-04 NOTE — Patient Instructions (Signed)
Please follow up with your eye doctor to check your vision and make sure nothing is wrong with your eyes.

## 2022-12-04 NOTE — Progress Notes (Signed)
Acute Office Visit   Patient: Elizabeth Scott   DOB: 08/17/1960   62 y.o. Female  MRN: [id number] Visit Date: 12/04/2022  Today's healthcare provider: [name] Danea Manter, [name]  Introduced myself to the patient as a Secondary school teacher and provided education on APPs in clinical practice.    Chief Complaint  Patient presents with   Diabetes    Elevated BS, pt forgot to take her meds for 2 days(Thur/Fri) and started feeling dizzy past Friday along headaches. She checked BS at home were high and end up going to the ER Saturday.   Headache   Dizziness   Subjective    HPI HPI     Diabetes    Additional comments: Elevated BS, pt forgot to take her meds for 2 days(Thur/Fri) and started feeling dizzy past Friday along headaches. She checked BS at home were high and end up going to the ER Saturday.      Last edited by Forde Radon, CMA on 12/04/2022 10:09 AM.       Diabetes, Type 2 - Last A1c 8.4 - Medications: Glipizide, Pioglitazone, Januvia,  - Compliance: good but missed 2 doses of medications last week. She missed all of her medications for the 2 days  - Checking BG at home: Yes most recent results were,   She reports she started feeling bad on Friday- she reports her glucose was near 300 after she got home from work, she ate something and noted her glucose was lower, around 220 She reports going to the ED on 12/02/22 and getting a migraine cocktail along with fluids which helped her feel better  She states Sunday she still felt a bit bad  She reports she is still having vision flashes, mild headache (has improved a lot since first onset), forgetfulness, intermittent diaphoresis and fatigue  She reports some difficulty with remembering things- like this AM she could not recall her code to unlock her phone    Headache   She reports significant improvement in her headache now  She reports the flashes of light in her vision have been ongoing since this started.  She  reports she has resumed taking her DM medications and other medications    Medications: Outpatient Medications Prior to Visit  Medication Sig   albuterol (PROVENTIL HFA;VENTOLIN HFA) 108 (90 BASE) MCG/ACT inhaler Inhale 2 puffs into the lungs every 2 (two) hours as needed for wheezing or shortness of breath.   amLODipine (NORVASC) 5 MG tablet Take 1 tablet (5 mg total) by mouth daily.   aspirin 325 MG tablet    atorvastatin (LIPITOR) 80 MG tablet TAKE 1 TABLET BY MOUTH EVERY DAY   chlorpheniramine-HYDROcodone (TUSSIONEX) 10-8 MG/5ML Take 5 mLs by mouth 2 (two) times daily.   ezetimibe (ZETIA) 10 MG tablet Take 1 tablet (10 mg total) by mouth daily.   glipiZIDE (GLUCOTROL XL) 10 MG 24 hr tablet TAKE 1 TABLET BY MOUTH TWICE A DAY   JANUVIA 25 MG tablet TAKE 1 TABLET (25 MG TOTAL) BY MOUTH DAILY.   losartan (COZAAR) 50 MG tablet TAKE 1 TABLET BY MOUTH EVERY DAY   methocarbamol (ROBAXIN) 500 MG tablet Take 1 tablet (500 mg total) by mouth 2 (two) times daily as needed for muscle spasms.   omeprazole (PRILOSEC) 20 MG capsule Take 20 mg by mouth daily.   pioglitazone (ACTOS) 15 MG tablet TAKE 1 TABLET (15 MG TOTAL) BY MOUTH DAILY.   benzonatate (TESSALON) 100 MG capsule Take  2 capsules (200 mg total) by mouth 3 (three) times daily as needed for cough. (Patient not taking: Reported on 11/07/2022)   fluconazole (DIFLUCAN) 150 MG tablet Take 1 tablet (150 mg total) by mouth every 3 (three) days as needed (for vaginal itching/yeast infection sx). (Patient not taking: Reported on 11/07/2022)   No facility-administered medications prior to visit.    Review of Systems  Constitutional:  Positive for fatigue. Negative for chills and fever.  Eyes:  Positive for visual disturbance.  Gastrointestinal:  Positive for nausea.  Genitourinary:  Negative for dysuria and frequency.  Neurological:  Positive for dizziness and headaches.  Psychiatric/Behavioral:  Positive for decreased concentration.         Objective    BP (!) 146/84   Pulse 88   Temp 97.8 F (36.6 C) (Oral)   Resp 16   Ht 5\' 3"  (1.6 m)   Wt 227 lb 3.2 oz (103.1 kg)   SpO2 95%   BMI 40.25 kg/m    Physical Exam Vitals reviewed.  Constitutional:      General: She is awake.     Appearance: Normal appearance. She is well-developed.  HENT:     Head: Normocephalic and atraumatic.     Mouth/Throat:     Mouth: Mucous membranes are moist.     Pharynx: Oropharynx is clear.  Eyes:     Extraocular Movements: Extraocular movements intact.     Right eye: Normal extraocular motion and no nystagmus.     Left eye: Normal extraocular motion and no nystagmus.     Pupils: Pupils are equal, round, and reactive to light.  Cardiovascular:     Rate and Rhythm: Normal rate and regular rhythm.     Heart sounds: Normal heart sounds. No murmur heard.    No friction rub. No gallop.  Pulmonary:     Effort: Pulmonary effort is normal. No respiratory distress.     Breath sounds: Normal breath sounds. No wheezing, rhonchi or rales.  Musculoskeletal:        General: Normal range of motion.     Cervical back: Normal range of motion.  Skin:    General: Skin is warm and dry.  Neurological:     Mental Status: She is alert and oriented to person, place, and time.     GCS: GCS eye subscore is 4. GCS verbal subscore is 5. GCS motor subscore is 6.     Cranial Nerves: Cranial nerves 2-12 are intact. No cranial nerve deficit, dysarthria or facial asymmetry.     Motor: No weakness.  Psychiatric:        Attention and Perception: Attention and perception normal.        Mood and Affect: Mood and affect normal.        Speech: Speech normal.        Behavior: Behavior normal. Behavior is cooperative.        Thought Content: Thought content normal.       Results for orders placed or performed in visit on 12/04/22  POCT urinalysis dipstick  Result Value Ref Range   Color, UA Yellow    Clarity, UA Cloudy    Glucose, UA Negative Negative    Bilirubin, UA Trace    Ketones, UA Negative    Spec Grav, UA 1.010 1.010 - 1.025   Blood, UA Trace    pH, UA 5.0 5.0 - 8.0   Protein, UA Negative Negative   Urobilinogen, UA 0.2 0.2 or 1.0 E.U./dL  Nitrite, UA Negative    Leukocytes, UA Trace (A) Negative   Appearance Yellow    Odor Foul     Assessment & Plan      No follow-ups on file.      Problem List Items Addressed This Visit       Endocrine   Type 2 diabetes mellitus without complication, without long-term current use of insulin (HCC) - Primary    Chronic, historic condition Patient is currently taking glipizide, pioglitazone, Januvia for management She reports missing 2 doses of all of her medication on Thursday and Friday.  She then reports feeling bad on Friday with her glucose near 300 when she got home from work. She was evaluated in the ED on 12/02/2022 for headache and elevated blood sugar. Reviewed ED visit notes, patient glucose levels were over 200 both times that they were checked but they did drop a little bit from initial testing after fluids  suspect that her current symptoms are likely secondary to missing 2 doses of all of her medications.  Her most recent A1c is 8.4 which demonstrates ongoing elevated blood glucose levels.  Recommend that she resumes her medications as directed.  Recommend that she follows up with PCP to discuss further diabetes management Follow-up as needed for persistent or progressing symptoms      Relevant Medications   Blood Glucose Monitoring Suppl DEVI   Glucose Blood (BLOOD GLUCOSE TEST STRIPS) STRP   Lancet Device MISC   Lancets Misc. MISC   Other Relevant Orders   POCT urinalysis dipstick (Completed)   Other Visit Diagnoses     Nausea       Relevant Orders   COMPLETE METABOLIC PANEL WITH GFR   Dizziness       Relevant Orders   CBC w/Diff/Platelet   COMPLETE METABOLIC PANEL WITH GFR   Acute nonintractable headache, unspecified headache type       Hematuria,  unspecified type     Acute, new finding  Given her concerns of mild forgetfulness along with nausea, dizziness, and headache I recommended checking urine dip which showed trace red blood cells, trace leukocytes Will send off culture for confirmation and manage as indicated by resutls Follow up as needed for persistent or progressing symptoms   Relevant Orders   Urine Culture   Medication dose missed     Patient recently missed 2 doses of all of her medications which I suspect is likely causing her current symptoms of headache, dizziness, mild nausea She was seen in the emergency room 12/02/2022-I reviewed the results of her lab work there as well as the ED visit note.  She did have an elevated white count as well as low potassium Will recheck CMP, CBC today. Results to dictate further management         No follow-ups on file.   I, Talajah Slimp E Nastacia Raybuck, [name], have reviewed all documentation for this visit. The documentation on 12/04/22 for the exam, diagnosis, procedures, and orders are all accurate and complete.   Jacquelin Hawking, MHS, [name] [facility]

## 2022-12-04 NOTE — Progress Notes (Deleted)
      Established patient visit   Patient: Elizabeth Scott   DOB: 14-May-1961   62 y.o. Female  MRN: [id number] Visit Date: 12/04/2022  Today's healthcare provider: Jacky Kindle, [name]   No chief complaint on file.  Subjective    HPI  ***  Medications: Outpatient Medications Prior to Visit  Medication Sig   albuterol (PROVENTIL HFA;VENTOLIN HFA) 108 (90 BASE) MCG/ACT inhaler Inhale 2 puffs into the lungs every 2 (two) hours as needed for wheezing or shortness of breath.   amLODipine (NORVASC) 5 MG tablet Take 1 tablet (5 mg total) by mouth daily.   aspirin 325 MG tablet    atorvastatin (LIPITOR) 80 MG tablet TAKE 1 TABLET BY MOUTH EVERY DAY   benzonatate (TESSALON) 100 MG capsule Take 2 capsules (200 mg total) by mouth 3 (three) times daily as needed for cough. (Patient not taking: Reported on 11/07/2022)   chlorpheniramine-HYDROcodone (TUSSIONEX) 10-8 MG/5ML Take 5 mLs by mouth 2 (two) times daily.   ezetimibe (ZETIA) 10 MG tablet Take 1 tablet (10 mg total) by mouth daily.   fluconazole (DIFLUCAN) 150 MG tablet Take 1 tablet (150 mg total) by mouth every 3 (three) days as needed (for vaginal itching/yeast infection sx). (Patient not taking: Reported on 11/07/2022)   glipiZIDE (GLUCOTROL XL) 10 MG 24 hr tablet TAKE 1 TABLET BY MOUTH TWICE A DAY   JANUVIA 25 MG tablet TAKE 1 TABLET (25 MG TOTAL) BY MOUTH DAILY.   losartan (COZAAR) 50 MG tablet TAKE 1 TABLET BY MOUTH EVERY DAY   methocarbamol (ROBAXIN) 500 MG tablet Take 1 tablet (500 mg total) by mouth 2 (two) times daily as needed for muscle spasms.   omeprazole (PRILOSEC) 20 MG capsule Take 20 mg by mouth daily.   pioglitazone (ACTOS) 15 MG tablet TAKE 1 TABLET (15 MG TOTAL) BY MOUTH DAILY.   No facility-administered medications prior to visit.    Review of Systems  {Labs  Heme  Chem  Endocrine  Serology  Results Review (optional):23779}   Objective    There were no vitals taken for this visit. {Show previous vital  signs (optional):23777}  Physical Exam  ***  No results found for any visits on 12/04/22.  Assessment & Plan     ***  No follow-ups on file.      {provider attestation***:1}   Jacky Kindle, [name]  [facility] [phone] (phone) [phone] (fax)  [facility]

## 2022-12-04 NOTE — Progress Notes (Signed)
Your urine showed evidence of a small amount of blood s white blood cells which could be indicative of a UTI.  We have sent a sample off for urine culture to confirm this.  We will keep you updated and provide any antibiotics as indicated once the culture results have returned.  For now please stay well-hydrated and avoid holding your urine for prolonged periods of time

## 2022-12-04 NOTE — Assessment & Plan Note (Addendum)
Chronic, historic condition Patient is currently taking glipizide, pioglitazone, Januvia for management She reports missing 2 doses of all of her medication on Thursday and Friday.  She then reports feeling bad on Friday with her glucose near 300 when she got home from work. She was evaluated in the ED on 12/02/2022 for headache and elevated blood sugar. Reviewed ED visit notes, patient glucose levels were over 200 both times that they were checked but they did drop a little bit from initial testing after fluids I have ordered her a blood glucose testing kit to assist with management of her symptoms as she did not previously have one.  I suspect this will also assist with her management of her blood glucose levels as she will be able to monitor at home  suspect that her current symptoms are likely secondary to missing 2 doses of all of her medications.  Her most recent A1c is 8.4 which demonstrates ongoing elevated blood glucose levels.  Recommend that she resumes her medications as directed.  Recommend that she follows up with PCP to discuss further diabetes management Follow-up as needed for persistent or progressing symptoms

## 2022-12-05 LAB — COMPLETE METABOLIC PANEL WITH GFR
AG Ratio: 1.4 (calc) (ref 1.0–2.5)
ALT: 11 U/L (ref 6–29)
AST: 12 U/L (ref 10–35)
Albumin: 3.9 g/dL (ref 3.6–5.1)
Alkaline phosphatase (APISO): 75 U/L (ref 37–153)
BUN: 12 mg/dL (ref 7–25)
CO2: 27 mmol/L (ref 20–32)
Calcium: 9.2 mg/dL (ref 8.6–10.4)
Chloride: 102 mmol/L (ref 98–110)
Creat: 0.66 mg/dL (ref 0.50–1.05)
Globulin: 2.8 g/dL (calc) (ref 1.9–3.7)
Glucose, Bld: 125 mg/dL — ABNORMAL HIGH (ref 65–99)
Potassium: 4 mmol/L (ref 3.5–5.3)
Sodium: 138 mmol/L (ref 135–146)
Total Bilirubin: 0.8 mg/dL (ref 0.2–1.2)
Total Protein: 6.7 g/dL (ref 6.1–8.1)
eGFR: 100 mL/min/{1.73_m2} (ref >=60)

## 2022-12-05 LAB — URINE CULTURE
MICRO NUMBER:: 14918438
Result:: NO GROWTH
SPECIMEN QUALITY:: ADEQUATE

## 2022-12-05 LAB — CBC WITH DIFFERENTIAL/PLATELET
Absolute Monocytes: 757 cells/uL (ref 200–950)
Basophils Absolute: 57 cells/uL (ref 0–200)
Eosinophils Absolute: 79 cells/uL (ref 15–500)
Eosinophils Relative: 0.7 %
Lymphs Abs: 1785 cells/uL (ref 850–3900)
MCH: 24.4 pg — ABNORMAL LOW (ref 27.0–33.0)
MCV: 75.4 fL — ABNORMAL LOW (ref 80.0–100.0)
MPV: 12.1 fL (ref 7.5–12.5)
Monocytes Relative: 6.7 %
Neutro Abs: 8622 cells/uL — ABNORMAL HIGH (ref 1500–7800)
Platelets: 237 10*3/uL (ref 140–400)
RDW: 16.3 % — ABNORMAL HIGH (ref 11.0–15.0)
WBC: 11.3 10*3/uL — ABNORMAL HIGH (ref 3.8–10.8)

## 2022-12-05 NOTE — Progress Notes (Signed)
Your electrolytes and blood glucose levels appear to be improving compared to the results that were found in the emergency room.  Your potassium is in normal levels and your blood glucose was at 125.  Your blood count appears to be normalizing as well.  Your white blood cells have decreased from their levels in the emergency room.  We are still waiting on your urine culture results and we will keep you updated once those are back.

## 2022-12-06 NOTE — Progress Notes (Unsigned)
There were no vitals taken for this visit.   Subjective:    Patient ID: Elizabeth Scott, female    DOB: 10/15/1960, 62 y.o.   MRN: [id number]  HPI: Elizabeth Scott is a 62 y.o. female  No chief complaint on file.   Relevant past medical, surgical, family and social history reviewed and updated as indicated. Interim medical history since our last visit reviewed. Allergies and medications reviewed and updated.  Review of Systems  Constitutional: Negative for fever or weight change.  Respiratory: Negative for cough and shortness of breath.   Cardiovascular: Negative for chest pain or palpitations.  Gastrointestinal: Negative for abdominal pain, no bowel changes.  Musculoskeletal: Negative for gait problem or joint swelling.  Skin: Negative for rash.  Neurological: Negative for dizziness or headache.  No other specific complaints in a complete review of systems (except as listed in HPI above).      Objective:    There were no vitals taken for this visit.  Wt Readings from Last 3 Encounters:  12/04/22 227 lb 3.2 oz (103.1 kg)  12/02/22 220 lb (99.8 kg)  11/07/22 222 lb 4.8 oz (100.8 kg)    Physical Exam  Constitutional: Patient appears well-developed and well-nourished. Obese *** No distress.  HEENT: head atraumatic, normocephalic, pupils equal and reactive to light, ears ***, neck supple, throat within normal limits Cardiovascular: Normal rate, regular rhythm and normal heart sounds.  No murmur heard. No BLE edema. Pulmonary/Chest: Effort normal and breath sounds normal. No respiratory distress. Abdominal: Soft.  There is no tenderness. Psychiatric: Patient has a normal mood and affect. behavior is normal. Judgment and thought content normal.  Results for orders placed or performed in visit on 12/04/22  Urine Culture   Specimen: Urine  Result Value Ref Range   MICRO NUMBER: [id number]    SPECIMEN QUALITY: Adequate    Sample Source URINE    STATUS: FINAL     Result: No Growth   CBC w/Diff/Platelet  Result Value Ref Range   WBC 11.3 (H) 3.8 - 10.8 Thousand/uL   RBC 4.96 3.80 - 5.10 Million/uL   Hemoglobin 12.1 11.7 - 15.5 g/dL   HCT 62.9 52.8 - 41.3 %   MCV 75.4 (L) 80.0 - 100.0 fL   MCH 24.4 (L) 27.0 - 33.0 pg   MCHC 32.4 32.0 - 36.0 g/dL   RDW 24.4 (H) 01.0 - 27.2 %   Platelets 237 140 - 400 Thousand/uL   MPV 12.1 7.5 - 12.5 fL   Neutro Abs 8,622 (H) 1,500 - 7,800 cells/uL   Lymphs Abs 1,785 850 - 3,900 cells/uL   Absolute Monocytes 757 200 - 950 cells/uL   Eosinophils Absolute 79 15 - 500 cells/uL   Basophils Absolute 57 0 - 200 cells/uL   Neutrophils Relative % 76.3 %   Total Lymphocyte 15.8 %   Monocytes Relative 6.7 %   Eosinophils Relative 0.7 %   Basophils Relative 0.5 %  COMPLETE METABOLIC PANEL WITH GFR  Result Value Ref Range   Glucose, Bld 125 (H) 65 - 99 mg/dL   BUN 12 7 - 25 mg/dL   Creat 5.36 6.44 - 0.34 mg/dL   eGFR 742 > OR = 60 VZ/DGL/8.75I4   BUN/Creatinine Ratio SEE NOTE: 6 - 22 (calc)   Sodium 138 135 - 146 mmol/L   Potassium 4.0 3.5 - 5.3 mmol/L   Chloride 102 98 - 110 mmol/L   CO2 27 20 - 32 mmol/L   Calcium 9.2 8.6 -  10.4 mg/dL   Total Protein 6.7 6.1 - 8.1 g/dL   Albumin 3.9 3.6 - 5.1 g/dL   Globulin 2.8 1.9 - 3.7 g/dL (calc)   AG Ratio 1.4 1.0 - 2.5 (calc)   Total Bilirubin 0.8 0.2 - 1.2 mg/dL   Alkaline phosphatase (APISO) 75 37 - 153 U/L   AST 12 10 - 35 U/L   ALT 11 6 - 29 U/L  POCT urinalysis dipstick  Result Value Ref Range   Color, UA Yellow    Clarity, UA Cloudy    Glucose, UA Negative Negative   Bilirubin, UA Trace    Ketones, UA Negative    Spec Grav, UA 1.010 1.010 - 1.025   Blood, UA Trace    pH, UA 5.0 5.0 - 8.0   Protein, UA Negative Negative   Urobilinogen, UA 0.2 0.2 or 1.0 E.U./dL   Nitrite, UA Negative    Leukocytes, UA Trace (A) Negative   Appearance Yellow    Odor Foul       Assessment & Plan:   Problem List Items Addressed This Visit   None    Follow up  plan: No follow-ups on file.

## 2022-12-06 NOTE — Progress Notes (Signed)
Your urine culture was negative for signs of bacterial growth meaning it is unlikely that you have a UTI.

## 2022-12-07 ENCOUNTER — Observation Stay: Payer: No Typology Code available for payment source

## 2022-12-07 ENCOUNTER — Other Ambulatory Visit: Payer: Self-pay

## 2022-12-07 ENCOUNTER — Telehealth: Payer: Self-pay | Admitting: *Deleted

## 2022-12-07 ENCOUNTER — Ambulatory Visit
Admission: RE | Admit: 2022-12-07 | Discharge: 2022-12-07 | Disposition: A | Discharge status: Home / Self Care | Payer: No Typology Code available for payment source | Source: Ambulatory Visit | Attending: Nurse Practitioner | Admitting: Nurse Practitioner

## 2022-12-07 ENCOUNTER — Encounter: Payer: Self-pay | Admitting: Nurse Practitioner

## 2022-12-07 ENCOUNTER — Ambulatory Visit (INDEPENDENT_AMBULATORY_CARE_PROVIDER_SITE_OTHER): Payer: No Typology Code available for payment source | Admitting: Nurse Practitioner

## 2022-12-07 ENCOUNTER — Emergency Department: Payer: No Typology Code available for payment source

## 2022-12-07 ENCOUNTER — Observation Stay
Admission: EM | Admit: 2022-12-07 | Discharge: 2022-12-08 | Disposition: A | Discharge status: Home / Self Care | Payer: No Typology Code available for payment source | Attending: Internal Medicine | Admitting: Internal Medicine

## 2022-12-07 VITALS — BP 128/76 | HR 96 | Temp 97.8°F | Resp 16 | Ht 63.0 in | Wt 226.1 lb

## 2022-12-07 DIAGNOSIS — E1165 Type 2 diabetes mellitus with hyperglycemia: Secondary | ICD-10-CM | POA: Diagnosis present

## 2022-12-07 DIAGNOSIS — I639 Cerebral infarction, unspecified: Secondary | ICD-10-CM

## 2022-12-07 DIAGNOSIS — Z79899 Other long term (current) drug therapy: Secondary | ICD-10-CM | POA: Insufficient documentation

## 2022-12-07 DIAGNOSIS — Z7984 Long term (current) use of oral hypoglycemic drugs: Secondary | ICD-10-CM

## 2022-12-07 DIAGNOSIS — Z7985 Long-term (current) use of injectable non-insulin antidiabetic drugs: Secondary | ICD-10-CM | POA: Diagnosis not present

## 2022-12-07 DIAGNOSIS — Z7982 Long term (current) use of aspirin: Secondary | ICD-10-CM | POA: Insufficient documentation

## 2022-12-07 DIAGNOSIS — I10 Essential (primary) hypertension: Secondary | ICD-10-CM | POA: Diagnosis present

## 2022-12-07 DIAGNOSIS — D509 Iron deficiency anemia, unspecified: Secondary | ICD-10-CM | POA: Diagnosis present

## 2022-12-07 DIAGNOSIS — H539 Unspecified visual disturbance: Secondary | ICD-10-CM | POA: Diagnosis not present

## 2022-12-07 DIAGNOSIS — E785 Hyperlipidemia, unspecified: Secondary | ICD-10-CM | POA: Diagnosis present

## 2022-12-07 DIAGNOSIS — K219 Gastro-esophageal reflux disease without esophagitis: Secondary | ICD-10-CM | POA: Diagnosis present

## 2022-12-07 DIAGNOSIS — R519 Headache, unspecified: Secondary | ICD-10-CM | POA: Insufficient documentation

## 2022-12-07 DIAGNOSIS — Z8616 Personal history of COVID-19: Secondary | ICD-10-CM | POA: Insufficient documentation

## 2022-12-07 DIAGNOSIS — R42 Dizziness and giddiness: Secondary | ICD-10-CM

## 2022-12-07 DIAGNOSIS — I63532 Cerebral infarction due to unspecified occlusion or stenosis of left posterior cerebral artery: Principal | ICD-10-CM | POA: Diagnosis present

## 2022-12-07 LAB — URINALYSIS, ROUTINE W REFLEX MICROSCOPIC
Bilirubin Urine: NEGATIVE
Glucose, UA: 50 mg/dL — AB
Hgb urine dipstick: NEGATIVE
Ketones, ur: NEGATIVE mg/dL
Leukocytes,Ua: NEGATIVE
Nitrite: NEGATIVE
Protein, ur: NEGATIVE mg/dL
Specific Gravity, Urine: 1.025 (ref 1.005–1.030)
pH: 6 (ref 5.0–8.0)

## 2022-12-07 LAB — CBC
HCT: 37.5 % (ref 36.0–46.0)
Hemoglobin: 12.8 g/dL (ref 12.0–15.0)
MCH: 24.4 pg — ABNORMAL LOW (ref 26.0–34.0)
MCHC: 34.1 g/dL (ref 30.0–36.0)
MCV: 71.4 fL — ABNORMAL LOW (ref 80.0–100.0)
Platelets: 239 10*3/uL (ref 150–400)
RBC: 5.25 MIL/uL — ABNORMAL HIGH (ref 3.87–5.11)
RDW: 15.6 % — ABNORMAL HIGH (ref 11.5–15.5)
WBC: 10.8 10*3/uL — ABNORMAL HIGH (ref 4.0–10.5)
nRBC: 0 % (ref 0.0–0.2)

## 2022-12-07 LAB — BASIC METABOLIC PANEL
Anion gap: 9 (ref 5–15)
BUN: 16 mg/dL (ref 6–20)
CO2: 23 mmol/L (ref 22–32)
Calcium: 8.8 mg/dL — ABNORMAL LOW (ref 8.9–10.3)
Chloride: 101 mmol/L (ref 98–111)
Creatinine, Ser: 0.58 mg/dL (ref 0.44–1.00)
GFR, Estimated: >60 mL/min (ref >60)
Glucose, Bld: 196 mg/dL — ABNORMAL HIGH (ref 70–99)
Potassium: 3.7 mmol/L (ref 3.5–5.1)
Sodium: 133 mmol/L — ABNORMAL LOW (ref 135–145)

## 2022-12-07 LAB — PROTIME-INR
INR: 1 (ref 0.8–1.2)
Prothrombin Time: 13.8 seconds (ref 11.4–15.2)

## 2022-12-07 LAB — HEPATIC FUNCTION PANEL
ALT: 23 U/L (ref 0–44)
AST: 26 U/L (ref 15–41)
Albumin: 4 g/dL (ref 3.5–5.0)
Alkaline Phosphatase: 84 U/L (ref 38–126)
Bilirubin, Direct: 0.2 mg/dL (ref 0.0–0.2)
Indirect Bilirubin: 1 mg/dL — ABNORMAL HIGH (ref 0.3–0.9)
Total Bilirubin: 1.2 mg/dL (ref 0.3–1.2)
Total Protein: 7.2 g/dL (ref 6.5–8.1)

## 2022-12-07 LAB — CBG MONITORING, ED
Glucose-Capillary: 198 mg/dL — ABNORMAL HIGH (ref 70–99)
Glucose-Capillary: 162 mg/dL — ABNORMAL HIGH (ref 70–99)

## 2022-12-07 LAB — APTT: aPTT: 28 seconds (ref 24–36)

## 2022-12-07 LAB — T4, FREE: Free T4: 0.88 ng/dL (ref 0.61–1.12)

## 2022-12-07 LAB — TSH: TSH: 2.082 u[IU]/mL (ref 0.350–4.500)

## 2022-12-07 MED ORDER — ALBUTEROL SULFATE (2.5 MG/3ML) 0.083% IN NEBU: 3.0000 mL | INHALATION_SOLUTION | RESPIRATORY_TRACT | Status: DC | PRN

## 2022-12-07 MED ORDER — LOSARTAN POTASSIUM 50 MG PO TABS
50.0000 mg | ORAL_TABLET | Freq: Every day | ORAL | Status: DC
  Administered 2022-12-08: 50 mg via ORAL
  Filled 2022-12-07: qty 1

## 2022-12-07 MED ORDER — ASPIRIN 325 MG PO TBEC
325.0000 mg | DELAYED_RELEASE_TABLET | Freq: Once | ORAL | Status: AC
  Administered 2022-12-07: 325 mg via ORAL
  Filled 2022-12-07: qty 1

## 2022-12-07 MED ORDER — PIOGLITAZONE HCL 15 MG PO TABS
15.0000 mg | ORAL_TABLET | Freq: Every day | ORAL | Status: DC
  Administered 2022-12-08: 15 mg via ORAL
  Filled 2022-12-07: qty 1

## 2022-12-07 MED ORDER — ACETAMINOPHEN 650 MG RE SUPP: 650.0000 mg | RECTAL | Status: DC | PRN

## 2022-12-07 MED ORDER — ATORVASTATIN CALCIUM 20 MG PO TABS
80.0000 mg | ORAL_TABLET | Freq: Every day | ORAL | Status: DC
  Administered 2022-12-07 – 2022-12-08 (×2): 80 mg via ORAL
  Filled 2022-12-07 (×2): qty 4

## 2022-12-07 MED ORDER — STROKE: EARLY STAGES OF RECOVERY BOOK: Freq: Once | Status: AC

## 2022-12-07 MED ORDER — INSULIN ASPART 100 UNIT/ML IJ SOLN
0.0000 [IU] | Freq: Three times a day (TID) | INTRAMUSCULAR | Status: DC
  Administered 2022-12-08: 4 [IU] via SUBCUTANEOUS
  Filled 2022-12-07: qty 1

## 2022-12-07 MED ORDER — ACETAMINOPHEN 160 MG/5ML PO SOLN: 650.0000 mg | ORAL | Status: DC | PRN

## 2022-12-07 MED ORDER — MIDAZOLAM HCL 2 MG/2ML IJ SOLN
2.0000 mg | Freq: Once | INTRAMUSCULAR | Status: AC
  Administered 2022-12-07: 2 mg via INTRAVENOUS
  Filled 2022-12-07: qty 2

## 2022-12-07 MED ORDER — CLOPIDOGREL BISULFATE 75 MG PO TABS
75.0000 mg | ORAL_TABLET | Freq: Every day | ORAL | Status: DC
  Administered 2022-12-07 – 2022-12-08 (×2): 75 mg via ORAL
  Filled 2022-12-07 (×2): qty 1

## 2022-12-07 MED ORDER — GVOKE HYPOPEN 2-PACK 0.5 MG/0.1ML ~~LOC~~ SOAJ
1.0000 | SUBCUTANEOUS | 1 refills | Status: DC | PRN
Start: 2022-12-07 — End: 2023-03-16

## 2022-12-07 MED ORDER — HEPARIN SODIUM (PORCINE) 5000 UNIT/ML IJ SOLN
5000.0000 [IU] | Freq: Two times a day (BID) | INTRAMUSCULAR | Status: DC
  Administered 2022-12-07 – 2022-12-08 (×2): 5000 [IU] via SUBCUTANEOUS
  Filled 2022-12-07: qty 1

## 2022-12-07 MED ORDER — EZETIMIBE 10 MG PO TABS
10.0000 mg | ORAL_TABLET | Freq: Every day | ORAL | Status: DC
  Administered 2022-12-08: 10 mg via ORAL
  Filled 2022-12-07: qty 1

## 2022-12-07 MED ORDER — ACETAMINOPHEN 325 MG PO TABS: 650.0000 mg | ORAL_TABLET | ORAL | Status: DC | PRN

## 2022-12-07 MED ORDER — TIRZEPATIDE 2.5 MG/0.5ML ~~LOC~~ SOAJ
2.5000 mg | SUBCUTANEOUS | 0 refills | Status: DC
Start: 2022-12-07 — End: 2022-12-08

## 2022-12-07 NOTE — Assessment & Plan Note (Signed)
Continue taking actos 15 mg daily, glipizide 10 mg change to once a day, januvia 25 mg daily.  Start mounjaro.

## 2022-12-07 NOTE — Assessment & Plan Note (Addendum)
Recent hemoglobin A1c 8.4.  Patient on glipizide, Januvia and Actos.  Elizabeth Scott was not started yet.  Will hold off on a new start of this medication for few weeks.

## 2022-12-07 NOTE — Assessment & Plan Note (Addendum)
Patient on aspirin and Lipitor.  Plavix was added for 21 days.  Cardiology will mail a cardiac monitor to her for further testing.  Patient stable to go home with follow-up with outpatient neurology.

## 2022-12-07 NOTE — Telephone Encounter (Signed)
  Chief Complaint: Results HCT Symptoms: NA Frequency: NA Pertinent Negatives: Patient denies NA Disposition: [] ED /[] Urgent Care (no appt availability in office) / [] Appointment(In office/virtual)/ []  [facility]/ [] Home Care/ [] Refused Recommended Disposition /[] [facility]/ []  Follow-up with PCP Additional Notes:   Stat results called during practices lunch break.Will alert FC when returns. Pt is waiting at facility.   IMPRESSION: Asymmetric hypodensity in the medial aspect of the left occipital lobe appears to involve the cortex. This could represent an infarct. Recommend MRI of the brain with and without contrast for further evaluation.

## 2022-12-07 NOTE — Assessment & Plan Note (Addendum)
Continue amlodipine and losartan 

## 2022-12-07 NOTE — Assessment & Plan Note (Addendum)
Continue high-dose Lipitor.  LDL 78.

## 2022-12-07 NOTE — Telephone Encounter (Signed)
Add: PCP made aware via Secure Chat.

## 2022-12-07 NOTE — Assessment & Plan Note (Addendum)
Likely secondary to stroke 

## 2022-12-07 NOTE — ED Provider Notes (Signed)
[facility] Provider Note    Event Date/Time   First MD Initiated Contact with Patient 12/07/22 1344     (approximate)   History   Headache and Dizziness   HPI  Elizabeth Scott is a 62 y.o. female with history of diabetes, hypertension, anemia presenting to the emergency department for evaluation of headache and dizziness.  On Friday she had onset of dizziness described as a spinning sensation.  On Saturday she began to develop a headache.  She was seen at [facility] where she was given a migraine cocktail with some improvement and ultimately discharged.  She followed up with her primary care doctor twice this week.  Today, she had an outpatient CT performed that demonstrated asymmetric hypodensity in the medial aspect of the left septal cortex concerning for possible infarct.  She was directed to the ER for MRI and further evaluation.  She does report some ongoing headache and instability when walking.  She denies any focal numbness, tingling, weakness.  Does report some occasional blurriness in her right visual field, denies double vision.      Physical Exam   Triage Vital Signs: ED Triage Vitals  Enc Vitals Group     BP 12/07/22 1226 (!) 202/86     Pulse Rate 12/07/22 1226 88     Resp 12/07/22 1226 18     Temp 12/07/22 1226 98.3 F (36.8 C)     Temp Source 12/07/22 1226 Oral     SpO2 12/07/22 1226 97 %     Weight 12/07/22 1221 226 lb 3.1 oz (102.6 kg)     Height --      Head Circumference --      Peak Flow --      Pain Score 12/07/22 1221 3     Pain Loc --      Pain Edu? --      Excl. in GC? --     Most recent vital signs: Vitals:   12/07/22 1226 12/07/22 1613  BP: (!) 202/86 (!) 167/80  Pulse: 88 78  Resp: 18 17  Temp: 98.3 F (36.8 C)   SpO2: 97% 98%     General: Awake, interactive  CV:  Regular rate, good peripheral perfusion.  Resp:  Lungs clear, unlabored respirations.  Abd:  Soft, nondistended.  Neuro:  Keenly aware,  correctly answers month and age, following commands, normal extraocular movements, no visual field cut, normal facial symmetry, 5-5 strength of bilateral upper and lower extremities.  No limb ataxia.  Intact sensation.  Ambulates with stable gait but subjectively reports feeling somewhat unsteady.  No aphasia, dysarthria.   ED Results / Procedures / Treatments   Labs (all labs ordered are listed, but only abnormal results are displayed) Labs Reviewed  BASIC METABOLIC PANEL - Abnormal; Notable for the following components:      Result Value   Sodium 133 (*)    Glucose, Bld 196 (*)    Calcium 8.8 (*)    All other components within normal limits  CBC - Abnormal; Notable for the following components:   WBC 10.8 (*)    RBC 5.25 (*)    MCV 71.4 (*)    MCH 24.4 (*)    RDW 15.6 (*)    All other components within normal limits  URINALYSIS, ROUTINE W REFLEX MICROSCOPIC - Abnormal; Notable for the following components:   Color, Urine YELLOW (*)    APPearance CLEAR (*)    Glucose, UA 50 (*)  All other components within normal limits  CBG MONITORING, ED - Abnormal; Notable for the following components:   Glucose-Capillary 198 (*)    All other components within normal limits  HIV ANTIBODY (ROUTINE TESTING W REFLEX)  LIPID PANEL  T4, FREE  TSH     EKG EKG independently reviewed interpreted by myself (ER attending) demonstrates: EKG demonstrates normal sinus rhythm at a rate of 79, PR 150, QRS 68, QTc 412, no acute ST changes   RADIOLOGY Imaging independently reviewed and interpreted by myself demonstrates:  MRI demonstrates patchy acute infarcts along the left PCA distribution, agree with radiology interpretation   PROCEDURES:  Critical Care performed: No  Procedures   MEDICATIONS ORDERED IN ED: Medications  albuterol (PROVENTIL) (2.5 MG/3ML) 0.083% nebulizer solution 3 mL (has no administration in time range)  atorvastatin (LIPITOR) tablet 80 mg (has no administration in  time range)  ezetimibe (ZETIA) tablet 10 mg (has no administration in time range)  pioglitazone (ACTOS) tablet 15 mg (has no administration in time range)  losartan (COZAAR) tablet 50 mg (has no administration in time range)   stroke: early stages of recovery book (has no administration in time range)  acetaminophen (TYLENOL) tablet 650 mg (has no administration in time range)    Or  acetaminophen (TYLENOL) 160 MG/5ML solution 650 mg (has no administration in time range)    Or  acetaminophen (TYLENOL) suppository 650 mg (has no administration in time range)  heparin injection 5,000 Units (has no administration in time range)  midazolam (VERSED) injection 2 mg (2 mg Intravenous Given 12/07/22 1543)  aspirin EC tablet 325 mg (325 mg Oral Given 12/07/22 1823)     IMPRESSION / MDM / ASSESSMENT AND PLAN / ED COURSE  I reviewed the triage vital signs and the nursing notes.  Differential diagnosis includes, but is not limited to, stroke, TIA, peripheral vertigo EMEA, electrode abnormality  Patient's presentation is most consistent with acute presentation with potential threat to life or bodily function.  62 year old female presenting with vertigo and headache with outpatient CT demonstrating possible septal infarct.  Case reviewed with Dr. Selina Cooley with neurology.  She reports that the location of patient's infarct would not typically cause vertigo.  Does recommend obtaining an MRI in the ER to further evaluate.  MRI demonstrating likely acute to subacute infarct along the left PCA distribution.  Reviewed with Dr. Selina Cooley, she does recommend admission to the hospital service and she will evaluate the patient in the morning.  Will reach out to hospitalist team.  Case discussed with Dr. Allena Katz.  She will evaluate the patient for anticipated admission.      FINAL CLINICAL IMPRESSION(S) / ED DIAGNOSES   Final diagnoses:  Acute left PCA stroke (HCC)     Rx / DC Orders   ED Discharge Orders      None        Note:  This document was prepared using Dragon voice recognition software and may include unintentional dictation errors.   Trinna Post, MD 12/07/22 [phone]

## 2022-12-07 NOTE — H&P (Signed)
History and Physical     Patient: Elizabeth Scott DOA: 12/07/2022 DOS: the patient was seen and examined on 12/07/2022 PCP: Berniece Salines, [name]   Patient coming from: Home  Chief Complaint: Headache  HISTORY OF PRESENT ILLNESS: Elizabeth Scott is an 62 y.o. female seen in ed for acute cva. Pt had complaints of headaches dizziness brain fog blurred vision that started on Friday.  Patient was seen in the emergency room on Saturday and was treated with migraine treatment per report. Today patient reporting persistent headache dizziness and vision symptoms left-sided headache and right-sided blurred vision.  No reports of chest pain shortness of breath palpitations nausea vomiting diarrhea fevers chills numbness tingling paresthesia incontinence or any other symptoms otherwise.  Patient does have past medical history as below diabetes poorly controlled hypertension.  Past Medical History:  Diagnosis Date   Allergy    Chronic sinusitis    COVID-19    Diabetes mellitus without complication (HCC)    GERD (gastroesophageal reflux disease)    High cholesterol    History of abnormal cervical Pap smear    Prior to Hysterectomy, normal paps for 2-3 years after Hysterectomy   History of gestational diabetes    Hypertension    Hypopotassemia    Microcytosis    Review of Systems  Eyes:  Positive for visual disturbance.       Blurred vision on looking to the left. No vision loss or diplopia.   Neurological:  Positive for dizziness and headaches.  All other systems reviewed and are negative.  Allergies  Allergen Reactions   Metformin And Related Other (See Comments)    Rash, Thrush    Other     walnuts   Past Surgical History:  Procedure Laterality Date   ABDOMINAL HYSTERECTOMY  2003   still has ovaries-for menorrhagia   CESAREAN SECTION     COLONOSCOPY  02/22/14   repeat 5 years   ESOPHAGOGASTRODUODENOSCOPY  02/22/14   TUBAL LIGATION      MEDICATIONS: Prior to Admission medications   Medication Sig Start Date End Date Taking? Authorizing Provider  albuterol (PROVENTIL HFA;VENTOLIN HFA) 108 (90 BASE) MCG/ACT inhaler Inhale 2 puffs into the lungs every 2 (two) hours as needed for wheezing or shortness of breath.    [provider]  amLODipine (NORVASC) 5 MG tablet Take 1 tablet (5 mg total) by mouth daily. 03/31/22   Berniece Salines, [name]  aspirin 325 MG tablet  08/23/21   [provider]  atorvastatin (LIPITOR) 80 MG tablet TAKE 1 TABLET BY MOUTH EVERY DAY 03/07/22   Berniece Salines, [name]  ezetimibe (ZETIA) 10 MG tablet Take 1 tablet (10 mg total) by mouth daily. 05/26/22   Berniece Salines, [name]  glipiZIDE (GLUCOTROL XL) 10 MG 24 hr tablet TAKE 1 TABLET BY MOUTH TWICE A DAY 03/10/22   Berniece Salines, [name]  Glucagon (GVOKE HYPOPEN 2-PACK) 0.5 MG/0.1ML SOAJ Inject 1 each into the skin as needed (for hypoglycemia). 12/07/22   Berniece Salines, [name]  JANUVIA 25 MG tablet TAKE 1 TABLET (25 MG TOTAL) BY MOUTH DAILY. 08/28/22   Berniece Salines, [name]  losartan (COZAAR) 50 MG tablet TAKE 1 TABLET BY MOUTH EVERY DAY 03/07/22   Berniece Salines, [name]  methocarbamol (ROBAXIN) 500 MG tablet Take 1 tablet (500 mg total) by mouth 2 (two) times daily as needed for muscle spasms. 10/19/21   Berniece Salines, [name]  omeprazole (PRILOSEC) 20 MG capsule  Take 20 mg by mouth daily.    [provider]  pioglitazone (ACTOS) 15 MG tablet TAKE 1 TABLET (15 MG TOTAL) BY MOUTH DAILY. 03/07/22   Berniece Salines, [name]  tirzepatide [facility]) 2.5 MG/0.5ML Pen Inject 2.5 mg into the skin once a week. 12/07/22   Berniece Salines, [name]    [START ON 12/08/2022]  stroke: early stages of recovery book   Does not apply Once   atorvastatin  80 mg Oral Daily   clopidogrel  75 mg Oral Daily   [START ON 12/08/2022] ezetimibe  10 mg Oral Daily   heparin  5,000 Units Subcutaneous Q12H   [START ON 12/08/2022] insulin aspart  0-20 Units Subcutaneous TID WC   [START ON  12/08/2022] losartan  50 mg Oral Daily   [START ON 12/08/2022] pioglitazone  15 mg Oral Daily   ED Course: Pt in Ed hypertensive afebrile alert and awake and oriented Vitals:   12/07/22 1221 12/07/22 1226 12/07/22 1613 12/07/22 2032  BP:  (!) 202/86 (!) 167/80 (!) 175/72  Pulse:  88 78 85  Resp:  18 17 15   Temp:  98.3 F (36.8 C)  98.7 F (37.1 C)  TempSrc:  Oral  Oral  SpO2:  97% 98% 98%  Weight: 102.6 kg     >EKG sinus rhythm at 79 with no acute ST changes PR 158 QTc 412. >Head CT noncontrast shows an asymmetric hypodensity in the left cortex representing infarct with MRI recommendations. >MRI shows:Patchy acute infarcts in the left thalamus and cerebral peduncle, medial temporal lobe, and occipital lobe consistent with acute to early subacute infarcts primarily in the PCA distribution without  hemorrhage or mass effect. No intake/output data recorded. SpO2: 98 % Blood work in ed shows: BMP shows glucose of 196 normal kidney function LFTs ordered. CBC shows leukocytosis of 10.8 normal hemoglobin and platelet count of 239. Results for orders placed or performed during the hospital encounter of 12/07/22 (from the past 72 hour(s))  CBG monitoring, ED     Status: Abnormal   Collection Time: 12/07/22 12:33 PM  Result Value Ref Range   Glucose-Capillary 198 (H) 70 - 99 mg/dL    Comment: Glucose reference range applies only to samples taken after fasting for at least 8 hours.  Basic metabolic panel     Status: Abnormal   Collection Time: 12/07/22 12:34 PM  Result Value Ref Range   Sodium 133 (L) 135 - 145 mmol/L   Potassium 3.7 3.5 - 5.1 mmol/L   Chloride 101 98 - 111 mmol/L   CO2 23 22 - 32 mmol/L   Glucose, Bld 196 (H) 70 - 99 mg/dL    Comment: Glucose reference range applies only to samples taken after fasting for at least 8 hours.   BUN 16 6 - 20 mg/dL   Creatinine, Ser 4.09 0.44 - 1.00 mg/dL   Calcium 8.8 (L) 8.9 - 10.3 mg/dL   GFR, Estimated >81 >19 mL/min    Comment:  (NOTE) Calculated using the CKD-EPI Creatinine Equation (2021)    Anion gap 9 5 - 15    Comment: Performed at [facility], [address] [place], [place] [address]  CBC     Status: Abnormal   Collection Time: 12/07/22 12:34 PM  Result Value Ref Range   WBC 10.8 (H) 4.0 - 10.5 K/uL   RBC 5.25 (H) 3.87 - 5.11 MIL/uL   Hemoglobin 12.8 12.0 - 15.0 g/dL   HCT 95.6 21.3 - 08.6 %  MCV 71.4 (L) 80.0 - 100.0 fL   MCH 24.4 (L) 26.0 - 34.0 pg   MCHC 34.1 30.0 - 36.0 g/dL   RDW 16.1 (H) 09.6 - 04.5 %   Platelets 239 150 - 400 K/uL   nRBC 0.0 0.0 - 0.2 %    Comment: Performed at [facility], [address] [place], [place] [address]  Urinalysis, Routine w reflex microscopic -Urine, Clean Catch     Status: Abnormal   Collection Time: 12/07/22 12:34 PM  Result Value Ref Range   Color, Urine YELLOW (A) YELLOW   APPearance CLEAR (A) CLEAR   Specific Gravity, Urine 1.025 1.005 - 1.030   pH 6.0 5.0 - 8.0   Glucose, UA 50 (A) NEGATIVE mg/dL   Hgb urine dipstick NEGATIVE NEGATIVE   Bilirubin Urine NEGATIVE NEGATIVE   Ketones, ur NEGATIVE NEGATIVE mg/dL   Protein, ur NEGATIVE NEGATIVE mg/dL   Nitrite NEGATIVE NEGATIVE   Leukocytes,Ua NEGATIVE NEGATIVE    Comment: Performed at [facility], [address] [place], [place] [address]  Hepatic function panel     Status: Abnormal   Collection Time: 12/07/22 12:35 PM  Result Value Ref Range   Total Protein 7.2 6.5 - 8.1 g/dL   Albumin 4.0 3.5 - 5.0 g/dL   AST 26 15 - 41 U/L   ALT 23 0 - 44 U/L   Alkaline Phosphatase 84 38 - 126 U/L   Total Bilirubin 1.2 0.3 - 1.2 mg/dL   Bilirubin, Direct 0.2 0.0 - 0.2 mg/dL   Indirect Bilirubin 1.0 (H) 0.3 - 0.9 mg/dL    Comment: Performed at [facility], [address] [place], [place] [address]  T4, free     Status: None   Collection Time: 12/07/22  8:38 PM  Result Value Ref Range   Free T4 0.88 0.61 - 1.12 ng/dL    Comment: (NOTE) Biotin ingestion may  interfere with free T4 tests. If the results are inconsistent with the TSH level, previous test results, or the clinical presentation, then consider biotin interference. If needed, order repeat testing after stopping biotin. Performed at [facility], [address] [place], [place] [address]   TSH     Status: None   Collection Time: 12/07/22  8:38 PM  Result Value Ref Range   TSH 2.082 0.350 - 4.500 uIU/mL    Comment: Performed by a 3rd Generation assay with a functional sensitivity of <=0.01 uIU/mL. Performed at [facility], [address] [place], [place] [address]   Protime-INR     Status: None   Collection Time: 12/07/22  8:38 PM  Result Value Ref Range   Prothrombin Time 13.8 11.4 - 15.2 seconds   INR 1.0 0.8 - 1.2    Comment: (NOTE) INR goal varies based on device and disease states. Performed at [facility], [address] [place], [place] [address]   APTT     Status: None   Collection Time: 12/07/22  8:38 PM  Result Value Ref Range   aPTT 28 24 - 36 seconds    Comment: Performed at [facility], [address] [place], [place] [address]  CBG monitoring, ED     Status: Abnormal   Collection Time: 12/07/22 10:30 PM  Result Value Ref Range   Glucose-Capillary 162 (H) 70 - 99 mg/dL    Comment: Glucose reference range applies only to samples taken after fasting for at least 8 hours.    Lab Results  Component Value Date   CREATININE 0.58 12/07/2022   CREATININE 0.66 12/04/2022   CREATININE 0.62 12/02/2022      Latest Ref Rng & Units 12/07/2022   12:35 PM 12/07/2022   12:34 PM 12/04/2022   11:02 AM  CMP  Glucose 70 - 99 mg/dL  604  540   BUN 6 - 20 mg/dL  16  12   Creatinine 9.81 - 1.00 mg/dL  1.91  4.78   Sodium 295 - 145 mmol/L  133  138   Potassium 3.5 - 5.1 mmol/L  3.7  4.0   Chloride 98 - 111 mmol/L  101  102   CO2 22 - 32 mmol/L  23  27   Calcium 8.9 - 10.3 mg/dL  8.8  9.2   Total Protein 6.5 - 8.1 g/dL 7.2   6.7    Total Bilirubin 0.3 - 1.2 mg/dL 1.2   0.8   Alkaline Phos 38 - 126 U/L 84     AST 15 - 41 U/L 26   12   ALT 0 - 44 U/L 23   11    Unresulted Labs (From admission, onward)     Start     Ordered   12/08/22 0500  Lipid panel  (Labs)  Tomorrow morning,   R       Comments: Fasting    12/07/22 1840   12/07/22 1838  HIV Antibody (routine testing w rflx)  (HIV Antibody (Routine testing w reflex) panel)  Once,   R        12/07/22 1840           Pt has received : Orders Placed This Encounter  Procedures   MR BRAIN WO CONTRAST    Standing Status:   Standing    Number of Occurrences:   1    Order Specific Question:   What is the patient's sedation requirement?    Answer:   No Sedation    Order Specific Question:   Does the patient have a pacemaker or implanted devices?    Answer:   No    Order Specific Question:   Radiology Contrast Protocol - do NOT remove file path    Answer:   [email or web address]   US Carotid Bilateral (at [facility] and [facility] only)    Standing Status:   Standing    Number of Occurrences:   1    Order Specific Question:   Symptom/Reason for Exam    Answer:   Stroke [facility]) [[id number]]   Basic metabolic panel    Standing Status:   Standing    Number of Occurrences:   1   CBC    Standing Status:   Standing    Number of Occurrences:   1   Urinalysis, Routine w reflex microscopic -Urine, Clean Catch    Standing Status:   Standing    Number of Occurrences:   1    Order Specific Question:   Specimen Source    Answer:   Urine, Clean Catch [76]   HIV Antibody (routine testing w rflx)    Standing Status:   Standing    Number of Occurrences:   1   Lipid panel    Fasting    Standing Status:   Standing    Number of Occurrences:   1   Hepatic function panel    Standing Status:   Standing    Number of Occurrences:   1   T4, free    Standing Status:  Standing    Number of Occurrences:   1   TSH    Standing Status:   Standing    Number  of Occurrences:   1   Protime-INR    Standing Status:   Standing    Number of Occurrences:   1   APTT    Standing Status:   Standing    Number of Occurrences:   1   Diet heart healthy/carb modified Room service appropriate? Yes; Fluid consistency: Thin    Standing Status:   Standing    Number of Occurrences:   1    Order Specific Question:   Diet-HS Snack?    Answer:   Nothing    Order Specific Question:   Room service appropriate?    Answer:   Yes    Order Specific Question:   Fluid consistency:    Answer:   Thin   Document Height and Actual Weight    Use scales to weigh patient, not stated or estimated weight.    Standing Status:   Standing    Number of Occurrences:   1   NIHSS score documentation NIHSS score range: 0-42    NIHSS score range: 0-42    Standing Status:   Standing    Number of Occurrences:   1   Vital signs    Standing Status:   Standing    Number of Occurrences:   1   Notify physician (specify)    Standing Status:   Standing    Number of Occurrences:   20    Order Specific Question:   Notify Physician    Answer:   change in neurologic status based on Neurologic Worsening protocol    Order Specific Question:   Notify Physician    Answer:   new onset dysrhythmia    Order Specific Question:   Notify Physician    Answer:   Systolic BP > 180 (not controlled by PRN continuous infusion medications) or less than 100    Order Specific Question:   Notify Physician    Answer:   Pox < 88% despite the use of supplemental O2 via nasal cannula    Order Specific Question:   Notify Physician    Answer:   Heart Rate > 120 or less than 50    Order Specific Question:   Notify Physician    Answer:   Respiratory Rate > 30    Order Specific Question:   Notify Physician    Answer:   Blood Glucose > 180mg /dl or less than 60mg /dl    Order Specific Question:   Notify Physician    Answer:   Temperature goal of 37 C (98.6 F). Acceptable range: 36.5 C - 37.5 C (97.7 F - 99.5 F)     Order Specific Question:   Notify Physician    Answer:   Urine output > 300 ml in 1 hour or less than 30 ml in 4 hours (unless anuric)   Swallow screen - If patient does NOT pass this screen, place order for SLP eval and treat (SLP2) - swallowing evaluation (BSE, MBS and/or diet order as indicated)    - If patient does NOT pass this screen, place order for SLP eval and treat (SLP2) - swallowing evaluation (BSE, MBS and/or diet order as indicated)    Standing Status:   Standing    Number of Occurrences:   1   RN to notify Physician for appropriate diet after patient passes swallow screen  after patient passes swallow screen    Standing Status:   Standing    Number of Occurrences:   1   NIH stroke scale    On arrival to unit, then q 4h    Standing Status:   Standing    Number of Occurrences:   1   Intake and output    Avoid use of indwelling catheter    Standing Status:   Standing    Number of Occurrences:   1   Cardiac Monitoring - Continuous Indefinite    Standing Status:   Standing    Number of Occurrences:   1    Order Specific Question:   Indications for use:    Answer:   Other    Order Specific Question:   Other indications for use:    Answer:   CVA   Apply Stroke Care Plan: Ischemic Stroke, TIA    Standing Status:   Standing    Number of Occurrences:   1   Discuss with patient and document patient's goals for stroke risk factor reduction    Standing Status:   Standing    Number of Occurrences:   1   Initiate Oral Care Protocol    Standing Status:   Standing    Number of Occurrences:   1   Initiate Carrier Fluid Protocol    Standing Status:   Standing    Number of Occurrences:   1   Provide stroke education material to patient and family    Standing Status:   Standing    Number of Occurrences:   1   Nurse to provide smoking / tobacco cessation education    Standing Status:   Standing    Number of Occurrences:   1   If the patient has passed the Stroke Swallow Screen  or has a feeding tube, then RN may order General Admission PRN Orders (through manage orders) for the following patient needs: allergy symptoms (Claritin), cold sores (Carmex), cough (Robitussin DM), eye irritation (Liquifilm Tears), hemorrhoids (Tucks), indigestion (Maalox), minor skin irritation (hydrocortisone cream), muscle pain Romeo Apple Gay), nose irritation (saline nasal spray) and sore throat (Chloraseptic spray).    Standing Status:   Standing    Number of Occurrences:   99999   SCD's    Standing Status:   Standing    Number of Occurrences:   1    Order Specific Question:   Laterality    Answer:   Bilateral   Ambulate patient (up to chair)    Standing Status:   Standing    Number of Occurrences:   1   Apply Diabetes Mellitus Care Plan    Standing Status:   Standing    Number of Occurrences:   1   STAT CBG when hypoglycemia is suspected. If treated, recheck every 15 minutes after each treatment until CBG >/= 70 mg/dl    Standing Status:   Standing    Number of Occurrences:   1   Refer to Hypoglycemia Protocol Sidebar Report for treatment of CBG < 70 mg/dl    Standing Status:   Standing    Number of Occurrences:   1   No HS correction Insulin    Standing Status:   Standing    Number of Occurrences:   1   Full code    Standing Status:   Standing    Number of Occurrences:   1    Order Specific Question:   By:    Answer:  Other   Consult to hospitalist    Standing Status:   Standing    Number of Occurrences:   1    Order Specific Question:   Place call to:    Answer:   [phone]    Order Specific Question:   Reason for Consult    Answer:   Admit    Order Specific Question:   Diagnosis/Clinical Info for Consult:    Answer:   62 y/o with HA/dizzy since Friday, outpatient CT with infarct, acute PCA infarct on MRI here, neuro recs admit   OT eval and treat    Standing Status:   Standing    Number of Occurrences:   1   PT eval and treat    Standing Status:   Standing    Number of  Occurrences:   1   Oxygen therapy Mode or (Route): Nasal cannula; Liters Per Minute: 2; Keep 02 saturation: greater than 94 %    Standing Status:   Standing    Number of Occurrences:   1    Order Specific Question:   Mode or (Route)    Answer:   Nasal cannula    Order Specific Question:   Liters Per Minute    Answer:   2    Order Specific Question:   Keep 02 saturation    Answer:   greater than 94 %   SLP eval and treat Reason for evaluation: Cognitive/Language evaluation    Standing Status:   Standing    Number of Occurrences:   1    Order Specific Question:   Reason for evaluation    Answer:   Cognitive/Language evaluation   CBG monitoring, ED    Standing Status:   Standing    Number of Occurrences:   1   CBG monitoring, ED    Standing Status:   Standing    Number of Occurrences:   1   ED EKG    Altered mental status    Standing Status:   Standing    Number of Occurrences:   1    Order Specific Question:   Reason for Exam    Answer:   Other (See Comments)   ECHOCARDIOGRAM COMPLETE    NEEDS BUBBLE STUDY    Standing Status:   Standing    Number of Occurrences:   1    Order Specific Question:   Perflutren DEFINITY (image enhancing agent) should be administered unless hypersensitivity or allergy exist    Answer:   Administer Perflutren    Order Specific Question:   Will [facility] be the location of this test?    Answer:   Yes    Order Specific Question:   Please indicate who you request to read the nuc med / echo results.    Answer:   [facility] Unassigned    Order Specific Question:   Reason for exam-Echo    Answer:   Stroke  I63.9   Place in observation (patient's expected length of stay will be less than 2 midnights)    Standing Status:   Standing    Number of Occurrences:   1    Order Specific Question:   Hospital Area    Answer:   [facility] [[id number]]    Order Specific Question:   Level of Care    Answer:   Telemetry Medical [104]    Order  Specific Question:   Covid Evaluation    Answer:   Asymptomatic - no recent  exposure (last 10 days) testing not required    Order Specific Question:   Diagnosis    Answer:   Headache [[id number]]    Order Specific Question:   Admitting Physician    Answer:   Darrold Junker    Order Specific Question:   Attending Physician    Answer:   Darrold Junker   Fall precautions    Standing Status:   Standing    Number of Occurrences:   1   Aspiration precautions    Standing Status:   Standing    Number of Occurrences:   1    Meds ordered this encounter  Medications   midazolam (VERSED) injection 2 mg   aspirin EC tablet 325 mg   albuterol (PROVENTIL) (2.5 MG/3ML) 0.083% nebulizer solution 3 mL   atorvastatin (LIPITOR) tablet 80 mg   ezetimibe (ZETIA) tablet 10 mg   pioglitazone (ACTOS) tablet 15 mg   losartan (COZAAR) tablet 50 mg    stroke: early stages of recovery book   OR Linked Order Group    acetaminophen (TYLENOL) tablet 650 mg    acetaminophen (TYLENOL) 160 MG/5ML solution 650 mg    acetaminophen (TYLENOL) suppository 650 mg   heparin injection 5,000 Units   clopidogrel (PLAVIX) tablet 75 mg   insulin aspart (novoLOG) injection 0-20 Units    Order Specific Question:   Correction coverage:    Answer:   Resistant (obese, steroids)    Order Specific Question:   CBG < 70:    Answer:   Implement Hypoglycemia Standing Orders and refer to Hypoglycemia Standing Orders sidebar report    Order Specific Question:   CBG 70 - 120:    Answer:   0 units    Order Specific Question:   CBG 121 - 150:    Answer:   3 units    Order Specific Question:   CBG 151 - 200:    Answer:   4 units    Order Specific Question:   CBG 201 - 250:    Answer:   7 units    Order Specific Question:   CBG 251 - 300:    Answer:   11 units    Order Specific Question:   CBG 301 - 350:    Answer:   15 units    Order Specific Question:   CBG 351 - 400:    Answer:   20 units    Order Specific  Question:   CBG > 400    Answer:   call MD and obtain STAT lab verification    Admission Imaging : US Carotid Bilateral (at [facility] and [facility] only)  Result Date: 12/07/2022 CLINICAL DATA:  Scattered multiple small infarcts. EXAM: BILATERAL CAROTID DUPLEX ULTRASOUND TECHNIQUE: Wallace Cullens scale imaging, color Doppler and duplex ultrasound were performed of bilateral carotid and vertebral arteries in the neck. COMPARISON:  None Available. FINDINGS: Criteria: Quantification of carotid stenosis is based on velocity parameters that correlate the residual internal carotid diameter with NASCET-based stenosis levels, using the diameter of the distal internal carotid lumen as the denominator for stenosis measurement. The following velocity measurements were obtained: RIGHT ICA: 92/28 cm/sec CCA: 98/16 cm/sec SYSTOLIC ICA/CCA RATIO:  0.9 ECA: 105 cm/sec LEFT ICA: 84/22 cm/sec CCA: 90/21 cm/sec SYSTOLIC ICA/CCA RATIO:  0.9 ECA: 85 cm/sec RIGHT CAROTID ARTERY: Preliminary grayscale images show mild intimal thickening within the common carotid artery. No significant plaque formation is noted. Waveforms velocities and flow velocity ratios show no evidence  of focal hemodynamically significant stenosis. RIGHT VERTEBRAL ARTERY:  Antegrade in nature. LEFT CAROTID ARTERY: Preliminary grayscale images demonstrate mild intimal thickening. No significant atherosclerotic plaque formation is noted. The waveforms, velocities and flow velocity ratios show no evidence of focal hemodynamically significant stenosis. LEFT VERTEBRAL ARTERY:  Antegrade in nature. IMPRESSION: No evidence of focal carotid stenosis. Electronically Signed   By: Alcide Clever M.D.   On: 12/07/2022 21:18   MR BRAIN WO CONTRAST  Result Date: 12/07/2022 CLINICAL DATA:  Blurry vision and headache. Possible infarcts seen on noncontrast CT. EXAM: MRI HEAD WITHOUT CONTRAST TECHNIQUE: Multiplanar, multiecho pulse sequences of the brain and surrounding structures were obtained  without intravenous contrast. COMPARISON:  Same-day head CT FINDINGS: Brain: There is patchy diffusion restriction in the left thalamus and cerebral peduncle, medial temporal lobe, and occipital lobe consistent with acute to early subacute infarct primarily in the PCA distribution. There is no hemorrhage or mass effect. There is no other acute infarct. There is no acute intracranial hemorrhage or extra-axial fluid collection. Parenchymal volume is normal. The ventricles are normal in size. Parenchymal signal is otherwise essentially normal with no significant burden of underlying chronic small-vessel ischemic change. The sella is mildly expanded and empty, nonspecific. There is no mass lesion. There is no mass effect or midline shift. Vascular: Normal flow voids. Skull and upper cervical spine: Normal marrow signal. Sinuses/Orbits: The paranasal sinuses are clear. The globes and orbits are unremarkable. Other: None. IMPRESSION: Patchy acute infarcts in the left thalamus and cerebral peduncle, medial temporal lobe, and occipital lobe consistent with acute to early subacute infarcts primarily in the PCA distribution without hemorrhage or mass effect. Electronically Signed   By: Lesia Hausen M.D.   On: 12/07/2022 16:18   CT HEAD WO CONTRAST ( )  Result Date: 12/07/2022 CLINICAL DATA:  Headache EXAM: CT HEAD WITHOUT CONTRAST TECHNIQUE: Contiguous axial images were obtained from the base of the skull through the vertex without intravenous contrast. RADIATION DOSE REDUCTION: This exam was performed according to the departmental dose-optimization program which includes automated exposure control, adjustment of the mA and/or kV according to patient size and/or use of iterative reconstruction technique. COMPARISON:  None Available. FINDINGS: Brain: There is an asymmetric hypodensity in the medial aspect of the left occipital lobe which appears to involve the cortex. This could represent an infarct. No hemorrhage. No  extra-axial fluid collection. No hydrocephalus. Partially empty sella. Vascular: No hyperdense vessel or unexpected calcification. Skull: Normal. Negative for fracture or focal lesion. Sinuses/Orbits: No middle ear or mastoid effusion. Paranasal sinuses are clear. Orbits are unremarkable. Other: None. IMPRESSION: Asymmetric hypodensity in the medial aspect of the left occipital lobe appears to involve the cortex. This could represent an infarct. Recommend MRI of the brain with and without contrast for further evaluation. These results will be called to the ordering clinician or representative by the Radiologist Assistant, and communication documented in the PACS or [employer]. Electronically Signed   By: Lorenza Cambridge M.D.   On: 12/07/2022 11:45   Physical Examination: Vitals:   12/07/22 1221 12/07/22 1226 12/07/22 1613 12/07/22 2032  BP:  (!) 202/86 (!) 167/80 (!) 175/72  Pulse:  88 78 85  Temp:  98.3 F (36.8 C)  98.7 F (37.1 C)  Resp:  18 17 15   Weight: 102.6 kg     SpO2:  97% 98% 98%  TempSrc:  Oral  Oral  BMI (Calculated): 40.08      Physical Exam Vitals and nursing note reviewed.  Constitutional:      General: She is not in acute distress.    Appearance: Normal appearance. She is not ill-appearing, toxic-appearing or diaphoretic.  HENT:     Head: Normocephalic and atraumatic.     Right Ear: Hearing and external ear normal.     Left Ear: Hearing and external ear normal.     Nose: Nose normal. No nasal deformity.     Mouth/Throat:     Lips: Pink.     Mouth: Mucous membranes are moist.     Tongue: No lesions.     Pharynx: Oropharynx is clear.  Eyes:     Extraocular Movements: Extraocular movements intact.     Pupils: Pupils are equal, round, and reactive to light.  Neck:     Vascular: No carotid bruit.  Cardiovascular:     Rate and Rhythm: Normal rate and regular rhythm.     Pulses: Normal pulses.     Heart sounds: Normal heart sounds.  Pulmonary:     Effort:  Pulmonary effort is normal.     Breath sounds: Normal breath sounds.  Abdominal:     General: Bowel sounds are normal. There is no distension.     Palpations: Abdomen is soft. There is no mass.     Tenderness: There is no abdominal tenderness. There is no guarding.     Hernia: No hernia is present.  Musculoskeletal:     Right lower leg: No edema.     Left lower leg: No edema.  Skin:    General: Skin is warm.  Neurological:     General: No focal deficit present.     Mental Status: She is alert and oriented to person, place, and time.     Cranial Nerves: Cranial nerves 2-12 are intact. No cranial nerve deficit, dysarthria or facial asymmetry.     Motor: Motor function is intact. No weakness.     Coordination: Coordination normal. Finger-Nose-Finger Test and Heel to [facility] Test normal.     Deep Tendon Reflexes: Reflexes normal.     Reflex Scores:      Bicep reflexes are 2+ on the right side and 2+ on the left side.      Patellar reflexes are 2+ on the right side and 2+ on the left side. Psychiatric:        Attention and Perception: Attention normal.        Mood and Affect: Mood normal.        Speech: Speech normal.        Behavior: Behavior normal. Behavior is cooperative.        Cognition and Memory: Cognition normal.     Assessment and Plan: * Headache D/d include HTN/ CVA PRN tylenol.   Acute CVA (cerebrovascular accident) [facility]) >Head CT noncontrast shows an asymmetric hypodensity in the left cortex representing infarct with MRI recommendations. >MRI shows:Patchy acute infarcts in the left thalamus and cerebral peduncle, medial temporal lobe, and occipital lobe consistent with acute to early subacute infarcts primarily in the PCA distribution without  hemorrhage or mass effect. We will start pt on asa or asa and plavix with loading once med rec is verified in ed.  -statin. -normotension . -am  lipid/ ft4/tsh/ echo with bubble.  -usg of carotid.     Anemia, iron  deficiency    Latest Ref Rng & Units 12/07/2022   12:34 PM 12/04/2022   11:02 AM 12/02/2022    1:31 PM  CBC  WBC 4.0 - 10.5 K/uL 10.8  11.3  12.8   Hemoglobin 12.0 - 15.0 g/dL 16.1  09.6  04.5   Hematocrit 36.0 - 46.0 % 37.5  37.4  40.8   Platelets 150 - 400 K/uL 239  237  248   Resolved. We will follow.    Uncontrolled type 2 diabetes mellitus with hyperglycemia, without long-term current use of insulin (HCC) Currently on glycemic protocol.  Cont home regimen of losartan/pioglitazone/NovoLog sliding scale.  Essential hypertension, benign Vitals:   12/07/22 1226 12/07/22 1613 12/07/22 2032  BP: (!) 202/86 (!) 167/80 (!) 175/72  Prn hydralazine. Cont losartan.    Hyperlipidemia Lipid panel.   Gastroesophageal reflux disease without esophagitis IV ppi.      DVT prophylaxis:  Heparin.  Code Status:  Full Code.      12/02/2022   12:17 PM  Advanced Directives  Does Patient Have a Medical Advance Directive? No    Family Communication:  None.  Emergency Contact: Contact Information     Name Relation Home Work [place] [phone]         Disposition Plan:  Home.   Consults: None.  Admission status: Observation.  Unit / Expected LOS: Med tele. / 1 day.  Gertha Calkin MD [facility]  6 PM- 2 AM. [phone]( Pager )  For questions regarding this patient please use [email or web address] to contact the current [facility] MD.   Bonita Quin may also call [phone] to contact current Assigned [facility] Attending/Consulting MD for this patient.

## 2022-12-07 NOTE — ED Triage Notes (Signed)
Pt has not been feeling well x 6 days. Went to [facility] 5 days ago for headache treated for allergies. Went to PCP on Monday with blurred vision continued headache and increase blood sugar. Went back to PCP today due no change in symptoms. Pt went for a CT scan today and was told to come to ER due to infarct seen on CT. Last known well 12/01/2022.  Pt states symptom are better today but at this time still has a slight headache, foggy head and blurred vision on right side.

## 2022-12-07 NOTE — Assessment & Plan Note (Addendum)
Patient placed on Plavix will have to switch her omeprazole over to Protonix.

## 2022-12-08 ENCOUNTER — Observation Stay: Payer: No Typology Code available for payment source

## 2022-12-08 ENCOUNTER — Other Ambulatory Visit: Payer: Self-pay | Admitting: Nurse Practitioner

## 2022-12-08 ENCOUNTER — Encounter: Payer: Self-pay | Admitting: Internal Medicine

## 2022-12-08 ENCOUNTER — Observation Stay (HOSPITAL_BASED_OUTPATIENT_CLINIC_OR_DEPARTMENT_OTHER)
Admit: 2022-12-08 | Discharge: 2022-12-08 | Disposition: A | Discharge status: Home / Self Care | Payer: No Typology Code available for payment source | Attending: Internal Medicine | Admitting: Internal Medicine

## 2022-12-08 ENCOUNTER — Ambulatory Visit: Payer: No Typology Code available for payment source | Attending: Nurse Practitioner

## 2022-12-08 ENCOUNTER — Telehealth: Payer: Self-pay | Admitting: Nurse Practitioner

## 2022-12-08 DIAGNOSIS — E1165 Type 2 diabetes mellitus with hyperglycemia: Secondary | ICD-10-CM

## 2022-12-08 DIAGNOSIS — I63532 Cerebral infarction due to unspecified occlusion or stenosis of left posterior cerebral artery: Secondary | ICD-10-CM | POA: Diagnosis not present

## 2022-12-08 DIAGNOSIS — I6389 Other cerebral infarction: Secondary | ICD-10-CM | POA: Diagnosis not present

## 2022-12-08 DIAGNOSIS — R519 Headache, unspecified: Secondary | ICD-10-CM | POA: Diagnosis not present

## 2022-12-08 DIAGNOSIS — K219 Gastro-esophageal reflux disease without esophagitis: Secondary | ICD-10-CM

## 2022-12-08 DIAGNOSIS — I639 Cerebral infarction, unspecified: Secondary | ICD-10-CM

## 2022-12-08 DIAGNOSIS — I10 Essential (primary) hypertension: Secondary | ICD-10-CM

## 2022-12-08 DIAGNOSIS — E785 Hyperlipidemia, unspecified: Secondary | ICD-10-CM

## 2022-12-08 LAB — ECHOCARDIOGRAM COMPLETE
AR max vel: 2.38 cm2
AV Area VTI: 2.54 cm2
AV Area mean vel: 2.4 cm2
AV Mean grad: 4 mmHg
AV Peak grad: 9.9 mmHg
Ao pk vel: 1.57 m/s
Area-P 1/2: 2.66 cm2
Height: 63 in
MV VTI: 2.51 cm2
S' Lateral: 3 cm
Weight: 3619.07 oz

## 2022-12-08 LAB — CBG MONITORING, ED: Glucose-Capillary: 156 mg/dL — ABNORMAL HIGH (ref 70–99)

## 2022-12-08 LAB — LIPID PANEL
Cholesterol: 139 mg/dL (ref 0–200)
HDL: 39 mg/dL — ABNORMAL LOW (ref >40)
LDL Cholesterol: 78 mg/dL (ref 0–99)
Total CHOL/HDL Ratio: 3.6 RATIO
Triglycerides: 111 mg/dL (ref <150)
VLDL: 22 mg/dL (ref 0–40)

## 2022-12-08 LAB — GLUCOSE, CAPILLARY: Glucose-Capillary: 177 mg/dL — ABNORMAL HIGH (ref 70–99)

## 2022-12-08 LAB — HIV ANTIBODY (ROUTINE TESTING W REFLEX): HIV Screen 4th Generation wRfx: NONREACTIVE

## 2022-12-08 MED ORDER — ASPIRIN 81 MG PO TBEC
81.0000 mg | DELAYED_RELEASE_TABLET | Freq: Every day | ORAL | Status: DC
  Administered 2022-12-08: 81 mg via ORAL
  Filled 2022-12-08: qty 1

## 2022-12-08 MED ORDER — LORATADINE 10 MG PO TABS
10.0000 mg | ORAL_TABLET | Freq: Once | ORAL | Status: AC
  Administered 2022-12-08: 10 mg via ORAL
  Filled 2022-12-08: qty 1

## 2022-12-08 MED ORDER — CLOPIDOGREL BISULFATE 75 MG PO TABS: 75.0000 mg | ORAL_TABLET | Freq: Every evening | ORAL | 0 refills | Status: DC

## 2022-12-08 MED ORDER — IOHEXOL 350 MG/ML SOLN
75.0000 mL | Freq: Once | INTRAVENOUS | Status: AC | PRN
  Administered 2022-12-08: 75 mL via INTRAVENOUS

## 2022-12-08 MED ORDER — PANTOPRAZOLE SODIUM 40 MG PO TBEC: 40.0000 mg | DELAYED_RELEASE_TABLET | Freq: Every day | ORAL | Status: DC

## 2022-12-08 NOTE — Telephone Encounter (Signed)
Patient has been called and given instructions on the Zio Monitor. This will be mailed to her home. She has been advised to call if she needs any help placing the monitor.  ZIO AT Long term monitor-Live Telemetry  Your physician has requested you wear a ZIO patch monitor for 14 days.  This is a single patch monitor. [employer] supplies one patch monitor per enrollment. Additional  stickers are not available.  Please do not apply patch if you will be having a Nuclear Stress Test, Echocardiogram, Cardiac CT, MRI,  or Chest Xray during the period you would be wearing the monitor. The patch cannot be worn during  these tests. You cannot remove and re-apply the ZIO AT patch monitor.  Your ZIO patch monitor will be mailed 3 day USPS to your address on file. It may take 3-5 days to  receive your monitor after you have been enrolled.  Once you have received your monitor, please review the enclosed instructions. Your monitor has  already been registered assigning a specific monitor serial # to you.   Billing and Patient Assistance Program information  Elizabeth Scott has been supplied with any insurance information on record for billing. [employer] offers a sliding scale Patient Assistance Program for patients without insurance, or whose  insurance does not completely cover the cost of the ZIO patch monitor. You must apply for the  Patient Assistance Program to qualify for the discounted rate. To apply, call [employer] at [phone],  select option 4, select option 2 , ask to apply for the Patient Assistance Program, (you can request an  interpreter if needed). [employer] will ask your household income and how many people are in your  household. [employer] will quote your out-of-pocket cost based on this information. They will also be able  to set up a 12 month interest free payment plan if needed.  Applying the monitor   Shave hair from upper left chest.  Hold the abrader disc by orange tab. Rub the abrader in 40  strokes over left upper chest as indicated in  your monitor instructions.  Clean area with 4 enclosed alcohol pads. Use all pads to ensure the area is cleaned thoroughly. Let  dry.  Apply patch as indicated in monitor instructions. Patch will be placed under collarbone on left side of  chest with arrow pointing upward.  Rub patch adhesive wings for 2 minutes. Remove the white label marked "1". Remove the white label  marked "2". Rub patch adhesive wings for 2 additional minutes.  While looking in a mirror, press and release button in center of patch. A small green light will flash 3-4  times. This will be your only indicator that the monitor has been turned on.  Do not shower for the first 24 hours. You may shower after the first 24 hours.  Press the button if you feel a symptom. You will hear a small click. Record Date, Time and Symptom in  the Patient Log.   Starting the Gateway  In your kit there is a Audiological scientist box the size of a cellphone. This is Buyer, retail. It transmits all your  recorded data to [facility]. This box must always stay within 10 feet of you. Open the box and push the *  button. There will be a light that blinks orange and then green a few times. When the light stops  blinking, the Gateway is connected to the ZIO patch. Call [employer] at [phone] to confirm your monitor is transmitting.  Returning  your monitor  Remove your patch and place it inside the Gateway. In the lower half of the Gateway there is a white  bag with prepaid postage on it. Place Gateway in bag and seal. Mail package back to Elizabeth Scott as soon as  possible. Your physician should have your final report approximately 7 days after you have mailed back  your monitor. Call [facility] Customer Care at [phone] if you have questions regarding your ZIO AT  patch monitor. Call them immediately if you see an orange light blinking on your monitor.  If your monitor falls off in less than 4  days, contact our Monitor department at [phone]. If your  monitor becomes loose or falls off after 4 days call [employer] at [phone] for suggestions on  securing your monitor

## 2022-12-08 NOTE — Progress Notes (Signed)
*  PRELIMINARY RESULTS* Echocardiogram 2D Echocardiogram with Bubble Study has been performed.  Carolyne Fiscal 12/08/2022, 12:31 PM

## 2022-12-08 NOTE — Evaluation (Signed)
Speech Language Pathology Evaluation Patient Details Name: Elizabeth Scott MRN: [id number] DOB: 1960-10-05 Today's Date: 12/08/2022 Time: 4098-1191 SLP Time Calculation (min) (ACUTE ONLY): 25 min  Problem List:  Patient Active Problem List   Diagnosis Date Noted   Acute CVA (cerebrovascular accident) (HCC) 12/07/2022   Headache 12/07/2022   Paronychia of great toe, right 02/11/2020   Uncontrolled type 2 diabetes mellitus with hyperglycemia, without long-term current use of insulin (HCC) 09/25/2018   Anemia, iron deficiency 10/17/2017   Breast cancer screening 09/08/2016   Left shoulder pain 09/08/2016   Encounter for medication monitoring 03/31/2016   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   [place]nut allergy 08/30/2015   Preventative health care 08/30/2015   Type 2 diabetes mellitus without complication, without long-term current use of insulin (HCC) 04/27/2015   Hyperlipidemia 04/27/2015   Essential hypertension, benign 04/27/2015   Microcytosis 04/27/2015   Class 2 severe obesity with serious comorbidity and body mass index (BMI) of 37.0 to 37.9 in adult [facility]) 04/27/2015   Gastroesophageal reflux disease without esophagitis 01/08/2015   Past Medical History:  Past Medical History:  Diagnosis Date   Allergy    Chronic sinusitis    COVID-19    Diabetes mellitus without complication (HCC)    GERD (gastroesophageal reflux disease)    High cholesterol    History of abnormal cervical Pap smear    Prior to Hysterectomy, normal paps for 2-3 years after Hysterectomy   History of gestational diabetes    Hypertension    Hypopotassemia    Microcytosis    Past Surgical History:  Past Surgical History:  Procedure Laterality Date   ABDOMINAL HYSTERECTOMY  2003   still has ovaries-for menorrhagia   CESAREAN SECTION     COLONOSCOPY  02/22/14   repeat 5 years   ESOPHAGOGASTRODUODENOSCOPY  02/22/14   TUBAL LIGATION     HPI:  62 yo female presenting to ED with persistent headache  dizziness and vision symptoms left-sided headache and right-sided blurred vision.  PMHx of diabetes and poorly controlled hypertension. CT angio 12/08/22 "1. Patchy acute to early subacute infarcts in the left PCA  distribution common better seen on brain MRI from 1 day prior.  2. Short-segment critical stenosis/occlusion of the proximal left P2  segment corresponding to the infarcts.  3. Otherwise patent vasculature of the head and neck without other  hemodynamically significant stenosis or occlusion." MRI 12/07/22 "Patchy acute infarcts in the left thalamus and cerebral peduncle,  medial temporal lobe, and occipital lobe consistent with acute to  early subacute infarcts primarily in the PCA distribution without  hemorrhage or mass effect."   Assessment / Plan / Recommendation Clinical Impression  Pt seen for speech/language evaluation. Pt alert, pleasant, and cooperative. Pt endorses wordfinding difficulty for ~1 week. Assessment comlpeted via informal means and portions of WAB. Pt presents with a fluent aphasia most c/w anomic aphadia. Pt speech is marked by some hesitations and wordfinding difficulty with inconsistent ability to repair communication breakdowns. Pt with good error awareness. Pt with impaired mildly conversation level wordfinding and generative naming. Repetition and confrontion naming are [facility]. Pt with mildly impaired auditory comprehension for complex yes/no questions and paragraph level auditory comprehension. Pt's speech is 100% intellgible; however, very mild articulatory imprecision noted. Recommend post-acute SLP services given pt's PLOF and noted communication difficulty. SLP to f/u while pt in house. Pt made aware of results, recommendations, and SLP POC. Pt verbalized understanding/agreement. Introduced Research scientist (life sciences) for anomia; reinforcement of content needed.    SLP  Assessment  SLP Recommendation/Assessment: Patient needs continued Speech Lanaguage Pathology Services SLP  Visit Diagnosis: Aphasia (R47.01)    Recommendations for follow up therapy are one component of a multi-disciplinary discharge planning process, led by the attending physician.  Recommendations may be updated based on patient status, additional functional criteria and insurance authorization.    Follow Up Recommendations  Outpatient SLP    Assistance Recommended at Discharge  Intermittent Supervision/Assistance  Functional Status Assessment Patient has had a recent decline in their functional status and demonstrates the ability to make significant improvements in function in a reasonable and predictable amount of time.  Frequency and Duration min 2x/week  2 weeks      SLP Evaluation Cognition  Overall Cognitive Status: Within Functional Limits for tasks assessed Arousal/Alertness: Awake/alert Orientation Level: Oriented X4 Problem Solving: Appears intact (verbal/non-verbal)       Comprehension  Auditory Comprehension Overall Auditory Comprehension: Impaired Yes/No Questions: Impaired Complex Questions: 75-100% accurate Commands: Within Functional Limits Conversation: Complex Other Conversation Comments: paragraph level auditory comprehension 80% accuracy    Expression Expression Primary Mode of Expression: Verbal Verbal Expression Overall Verbal Expression: Impaired Initiation: No impairment Automatic Speech:  (WFL) Level of Generative/Spontaneous Verbalization: Conversation (mild anomia) Repetition: No impairment Naming: Impairment Confrontation: Within functional limits Divergent:  (10 animals in 60s) Verbal Errors: Aware of errors Pragmatics: Impairment Impairments: Topic maintenance   Oral / Motor  Motor Speech Overall Motor Speech: Appears within functional limits for tasks assessed Phonation: Normal Resonance: Within functional limits Articulation: Impaired (very minimal articulatory imprecision) Intelligibility: Intelligible Motor Speech Errors: Not  applicable           Clyde Canterbury, M.S., CCC-SLP Speech-Language Pathologist [facility] [phone] (ASCOM)  Woodroe Chen 12/08/2022, 9:45 AM

## 2022-12-08 NOTE — Plan of Care (Signed)
Patient is adequate for discharge from this facility with family support.

## 2022-12-08 NOTE — Evaluation (Signed)
Occupational Therapy Evaluation Patient Details Name: Elizabeth Scott MRN: [id number] DOB: Jun 12, 1961 Today's Date: 12/08/2022   History of Present Illness Elizabeth Scott is a 61yoF who [facility]07[facility]22 after continued HA, blurred vision, hyperglycemia. Previously at [facility] ED 5 days prior, then [facility]. Head CT ordered with results warranting coming to ED. MRI here shows "Patchy acute infarcts in the left thalamus and cerebral peduncle,  medial temporal lobe, and occipital lobe consistent with acute to early subacute infarcts primarily in the PCA distribution without hemorrhage or mass effect."   Clinical Impression   Patient presenting with visual perceptual deficits, provided Edwyna Shell Chart as HEP with recommendations to schedule appointment with optometrist at hospital d/c. Hart Chart provided assists with biocular eye teaming, convergence/divergence and horizontal/vertical scanning needed for IADLs such as reading, using a computer for work and med [employer]. BUE WNL with no observed strength, coordination or movement deficits s/t CVA. Recommending no driving until cleared by outpatient MD/neurologist. Patient IND with meds, money, driving, working PTA. Patient currently functioning at near baseline with the exception of visual deficits noted in impaired smooth pursuits, jerky saccades, and impaired visual spatial skills best addressed at next level of care. OT will sign off.      Recommendations for follow up therapy are one component of a multi-disciplinary discharge planning process, led by the attending physician.  Recommendations may be updated based on patient status, additional functional criteria and insurance authorization.   Assistance Recommended at Discharge None     Functional Status Assessment  Patient has not had a recent decline in their functional status        Precautions / Restrictions Precautions Precautions: Fall      Mobility Bed Mobility Overal bed  mobility: Independent                  Transfers Overall transfer level: Independent Equipment used: None                      Balance Overall balance assessment: Independent                                         ADL either performed or assessed with clinical judgement   ADL Overall ADL's : Independent                                             Vision Baseline Vision/History: 1 Wears glasses Patient Visual Report: Other (comment) (Pt denies diplopia, blurriness, or tunnel vision. Words appear "to move on R side only like they are dropping off the page".) Vision Assessment?: Yes Eye Alignment: Impaired (comment) Tracking/Visual Pursuits: Decreased smoothness of vertical tracking Saccades: Overshoots;Undershoots;Additional eye shifts occurred during testing Convergence: Within functional limits Visual Fields: No apparent deficits Additional Comments: Cover/uncover and VF testing WNL for all quadrants; pt able to read fine and large print functionally, reads paragraph WNL, correctly identifies items in busy page. Main visual deficits are decreased eye teaming and impaired smooth pursuits with overshooting/undershooting. Decreased convergence noted R eye.            Pertinent Vitals/Pain Pain Assessment Pain Assessment: No/denies pain     Hand Dominance     Extremity/Trunk Assessment Upper Extremity Assessment Upper Extremity Assessment:  (BUE  WNL, coordination WNL, no ROM, pt denies tingling/numbness, good grip strength bilaterally.)           Communication Communication Communication: No difficulties   Cognition Arousal/Alertness: Awake/alert Behavior During Therapy: WFL for tasks assessed/performed Overall Cognitive Status: Within Functional Limits for tasks assessed                                       General Comments       Exercises Other Exercises Other Exercises: Edwyna Shell Chart provided  to improve eye accomodation/teaming while scanning horizontally/vertically, pt able to return demonstrate with min vcs   Shoulder Instructions      Home Living Family/patient expects to be discharged to:: Private residence Living Arrangements: Spouse/significant other   Type of Home: House Home Access: Stairs to enter Secretary/administrator of Steps: 3   Home Layout: One level     Bathroom Shower/Tub: Dietitian: None      Lives With: Spouse    Prior Functioning/Environment Prior Level of Function : Independent/Modified Independent               ADLs Comments: PLOF IND, including meds, money, driving, works at [employer] in [employer] OT "6 Clicks" Daily Activity     Outcome Measure Help from another person eating meals?: None Help from another person taking care of personal grooming?: None Help from another person toileting, which includes using toliet, bedpan, or urinal?: None Help from another person bathing (including washing, rinsing, drying)?: None Help from another person to put on and taking off regular upper body clothing?: None Help from another person to put on and taking off regular lower body clothing?: None 6 Click Score: 24   End of Session    Activity Tolerance: Patient tolerated treatment well Patient left: in bed;with family/visitor present;with call bell/phone within reach                   Time: 1402-1431 OT Time Calculation (min): 29 min Charges:  OT General Charges $OT Visit: 1 Visit OT Evaluation $OT Eval Moderate Complexity: 1 Mod OT Treatments $Neuromuscular Re-education: 8-22 mins  Leandra Vanderweele L. Taelon Bendorf, OTR/L  12/08/22, 2:52 PM

## 2022-12-08 NOTE — Discharge Instructions (Signed)
No driving rec until seen by neurology Cardiology to send you a heart monitor in the mail

## 2022-12-08 NOTE — Evaluation (Signed)
Physical Therapy Evaluation Patient Details Name: Elizabeth Scott MRN: [id number] DOB: 1961/07/14 Today's Date: 12/08/2022  History of Present Illness  Elizabeth Scott is a 61yoF who comes to [facility] on 12/07/22 after continued HA, blurred vision, hyperglycemia. Previously at [facility] ED 5 days prior, then [facility]. Head CT ordered with results warranting coming to ED. 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 Reports isolated visual field deficits, denies any notable acute motor or sensory deficits. Pt denies any new difficulty with walking or balance. Pt demonstrates independent mobility in session, AMB limited community distances without difficulty. Despite visual field deficits, pt able to AMB in hall without device and calculate various combinations of author's pocket change. Will sign off at this time. No skilled PT needs at this time.    Recommendations for follow up therapy are one component of a multi-disciplinary discharge planning process, led by the attending physician.  Recommendations may be updated based on patient status, additional functional criteria and insurance authorization.  Follow Up Recommendations       Assistance Recommended at Discharge None  Patient can return home with the following  Assist for transportation    Equipment Recommendations None recommended by PT  Recommendations for Other Services       Functional Status Assessment Patient has not had a recent decline in their functional status     Precautions / Restrictions Precautions Precautions: Fall      Mobility  Bed Mobility Overal bed mobility: Independent                  Transfers Overall transfer level: Independent Equipment  used: None                    Ambulation/Gait   Gait Distance (Feet): 200 Feet Assistive device: None            Stairs            Wheelchair Mobility    Modified Rankin (Stroke Patients Only)       Balance                                             Pertinent Vitals/Pain Pain Assessment Pain Assessment: No/denies pain    Home Living Family/patient expects to be discharged to:: Private residence Living Arrangements: Spouse/significant other   Type of Home: House                  Prior Function                       Hand Dominance        Extremity/Trunk Assessment                Communication      Cognition Arousal/Alertness: Awake/alert Behavior During Therapy: WFL for tasks assessed/performed Overall Cognitive Status: Within Functional Limits for tasks assessed                                          General Comments  Exercises     Assessment/Plan    PT Assessment Patient does not need any further PT services  PT Problem List         PT Treatment Interventions      PT Goals (Current goals can be found in the Care Plan section)  Acute Rehab PT Goals PT Goal Formulation: All assessment and education complete, DC therapy    Frequency       Co-evaluation               AM-PAC PT "6 Clicks" Mobility  Outcome Measure Help needed turning from your back to your side while in a flat bed without using bedrails?: None Help needed moving from lying on your back to sitting on the side of a flat bed without using bedrails?: None Help needed moving to and from a bed to a chair (including a wheelchair)?: None Help needed standing up from a chair using your arms (e.g., wheelchair or bedside chair)?: None Help needed to walk in hospital room?: None Help needed climbing 3-5 steps with a railing? : None 6 Click Score: 24    End of Session   Activity Tolerance:  Patient tolerated treatment well;No increased pain Patient left: in bed;with call bell/phone within reach Nurse Communication: Mobility status PT Visit Diagnosis: Other symptoms and signs involving the nervous system (Z61.096)    Time: 0454-0981 PT Time Calculation (min) (ACUTE ONLY): 10 min   Charges:   PT Evaluation $PT Eval Low Complexity: 1 Low         10:27 AM, 12/08/22 Rosamaria Lints, PT, DPT Physical Therapist - [facility]  [phone] (ASCOM)   Zhanae Proffit C 12/08/2022, 10:23 AM

## 2022-12-08 NOTE — Telephone Encounter (Signed)
   Pt being discharged from [facility] following admission for stroke.  We have been asked to place a 14-day live telemetry monitor.  Order has been placed and I will ask our office to arrange for monitor to be mailed to patient.  Nicolasa Ducking, NP 12/08/2022, 3:47 PM

## 2022-12-08 NOTE — Hospital Course (Signed)
62 year old female whose been having headache and dizziness for a while sent in for CVA.  Patient does not have any weakness 1 side versus the other.  Does have slight visual issues with right eye.  CT scan of the head showed asymmetric hypodensity medial aspect left occipital lobe appears to involve the cortex.  CT angio head and neck shows patchy acute to early subacute infarcts left PCA distribution, otherwise patent vasculature of head and neck without any other hemodynamically significant stenosis, short segment of stenosis proximal left P2.  MRI of the brain showed patchy acute infarcts in the left thalamus and cerebral peduncle, medial temporal lobe and occipital lobe consistent with acute to subacute infarcts primarily in the PCA distribution.  Echocardiogram showed a normal ejection fraction and agitated saline contrast bubble study was negative with no evidence of intra atrial shunt.  No evidence of patent foramen ovale.  No evidence of atrial septal defect.  LDL 78.

## 2022-12-08 NOTE — Progress Notes (Signed)
Discharge instructions reviewed with patient including followup visits and new medications.  Understanding was verbalized and all questions were answered.  IV removed without complication; patient tolerated well.  Patient discharged home via wheelchair in stable condition escorted by nursing staff.  

## 2022-12-08 NOTE — Discharge Summary (Signed)
Physician Discharge Summary   Patient: Elizabeth Scott MRN: [id number] DOB: 10-Mar-1961  Admit date:     12/07/2022  Discharge date: 12/08/22  Discharge Physician: Alford Highland   PCP: Berniece Salines, [name]   Recommendations at discharge:   Follow-up PCP 5 days Follow-up neurology 3 weeks Heart monitor to be mailed to patient to rule out arrhythmia.  Discharge Diagnoses: Principal Problem:   Acute left PCA stroke (HCC) Active Problems:   Headache   Uncontrolled type 2 diabetes mellitus with hyperglycemia, without long-term current use of insulin (HCC)   Essential hypertension, benign   Hyperlipidemia   Gastroesophageal reflux disease without esophagitis    Hospital Course: 62 year old female whose been having headache and dizziness for a while sent in for CVA.  Patient does not have any weakness 1 side versus the other.  Does have slight visual issues with right eye.  CT scan of the head showed asymmetric hypodensity medial aspect left occipital lobe appears to involve the cortex.  CT angio head and neck shows patchy acute to early subacute infarcts left PCA distribution, otherwise patent vasculature of head and neck without any other hemodynamically significant stenosis, short segment of stenosis proximal left P2.  MRI of the brain showed patchy acute infarcts in the left thalamus and cerebral peduncle, medial temporal lobe and occipital lobe consistent with acute to subacute infarcts primarily in the PCA distribution.  Echocardiogram showed a normal ejection fraction and agitated saline contrast bubble study was negative with no evidence of intra atrial shunt.  No evidence of patent foramen ovale.  No evidence of atrial septal defect.  LDL 78.  Assessment and Plan: * Acute left PCA stroke (HCC) Patient on aspirin and Lipitor.  Plavix was added for 21 days.  Cardiology will mail a cardiac monitor to her for further testing.  Patient stable to go home with follow-up with  outpatient neurology.   Headache Likely secondary to stroke.  Uncontrolled type 2 diabetes mellitus with hyperglycemia, without long-term current use of insulin (HCC) Recent hemoglobin A1c 8.4.  Patient on glipizide, Januvia and Actos.  Greggory Keen was not started yet.  Will hold off on a new start of this medication for few weeks.  Essential hypertension, benign Continue amlodipine and losartan   Hyperlipidemia Continue high-dose Lipitor.  LDL 78.  Gastroesophageal reflux disease without esophagitis Patient placed on Plavix will have to switch her omeprazole over to Protonix.         Consultants: Neurology Procedures performed: None Disposition: Home Diet recommendation:  Cardiac and Carb modified diet DISCHARGE MEDICATION: Allergies as of 12/08/2022       Reactions   Metformin And Related Other (See Comments)   Rash, Thrush   Other    walnuts        Medication List     STOP taking these medications    omeprazole 20 MG capsule Commonly known as: PRILOSEC   tirzepatide 2.5 MG/0.5ML Pen Commonly known as: MOUNJARO       TAKE these medications    albuterol 108 (90 Base) MCG/ACT inhaler Commonly known as: VENTOLIN HFA Inhale 2 puffs into the lungs every 2 (two) hours as needed for wheezing or shortness of breath.   amLODipine 5 MG tablet Commonly known as: NORVASC Take 1 tablet (5 mg total) by mouth daily.   aspirin EC 81 MG tablet Take 81 mg by mouth daily.   atorvastatin 80 MG tablet Commonly known as: LIPITOR TAKE 1 TABLET BY MOUTH EVERY DAY   clopidogrel  75 MG tablet Commonly known as: PLAVIX Take 1 tablet (75 mg total) by mouth every evening.   ezetimibe 10 MG tablet Commonly known as: ZETIA Take 1 tablet (10 mg total) by mouth daily.   glipiZIDE 10 MG 24 hr tablet Commonly known as: GLUCOTROL XL TAKE 1 TABLET BY MOUTH TWICE A DAY   Gvoke HypoPen 2-Pack 0.5 MG/0.1ML Soaj Generic drug: Glucagon Inject 1 each into the skin as needed  (for hypoglycemia).   Januvia 25 MG tablet Generic drug: sitaGLIPtin TAKE 1 TABLET (25 MG TOTAL) BY MOUTH DAILY.   losartan 50 MG tablet Commonly known as: COZAAR TAKE 1 TABLET BY MOUTH EVERY DAY   methocarbamol 500 MG tablet Commonly known as: ROBAXIN Take 1 tablet (500 mg total) by mouth 2 (two) times daily as needed for muscle spasms.   pantoprazole 40 MG tablet Commonly known as: Protonix Take 1 tablet (40 mg total) by mouth daily.   pioglitazone 15 MG tablet Commonly known as: ACTOS TAKE 1 TABLET (15 MG TOTAL) BY MOUTH DAILY.        Follow-up Information     Berniece Salines, [name] Follow up in 5 day(s).   Specialty: Nurse Practitioner Contact information: [address] [place] [address] [phone]         Lonell Face, MD Follow up in 2 week(s).   Specialty: Neurology Contact information: [phone] [facility] [address] [facility] [place] [address] [phone]         Antonieta Iba, MD Follow up in 4 week(s).   Specialty: Cardiology Contact information: [address] [place] [address] [phone]                Discharge Exam: Ceasar Mons Weights   12/07/22 1221  Weight: 102.6 kg   Physical Exam HENT:     Head: Normocephalic.     Mouth/Throat:     Pharynx: No oropharyngeal exudate.  Eyes:     General: Lids are normal.     Conjunctiva/sclera: Conjunctivae normal.  Cardiovascular:     Rate and Rhythm: Normal rate and regular rhythm.     Heart sounds: Normal heart sounds, S1 normal and S2 normal.  Pulmonary:     Breath sounds: No decreased breath sounds, wheezing, rhonchi or rales.  Abdominal:     Palpations: Abdomen is soft.     Tenderness: There is no abdominal tenderness.  Musculoskeletal:     Right lower leg: No swelling.     Left lower leg: No swelling.  Skin:    General: Skin is warm.     Findings: No rash.  Neurological:     Mental Status: She is alert and  oriented to person, place, and time.     Comments: Power 5 out of 5 upper and lower extremities      Condition at discharge: serious  The results of significant diagnostics from this hospitalization (including imaging, microbiology, ancillary and laboratory) are listed below for reference.   Imaging Studies: ECHOCARDIOGRAM COMPLETE  Result Date: 12/08/2022    ECHOCARDIOGRAM REPORT   Patient Name:   Elizabeth Scott Date of Exam: 12/08/2022 Medical Rec #:  [id number]              Height:       63.0 in Accession #:    [id number]             Weight:       226.2 lb Date of Birth:  08/24/1960               BSA:          2.037 m Patient Age:    61 years               BP:           170/90 mmHg Patient Gender: F                      HR:           71 bpm. Exam Location:  [facility] Procedure: 2D Echo, Cardiac Doppler, Color Doppler and Saline Contrast Bubble            Study Indications:     Stroke  History:         Patient has no prior history of Echocardiogram examinations.                  Stroke; Risk Factors:Hypertension, Diabetes and Dyslipidemia.  Sonographer:     Mikki Harbor Referring Phys:  [id number] Eliezer Mccoy PATEL Diagnosing Phys: Julien Nordmann MD IMPRESSIONS  1. Left ventricular ejection fraction, by estimation, is 60 to 65%. The left ventricle has normal function. The left ventricle has no regional wall motion abnormalities. There is mild left ventricular hypertrophy. Left ventricular diastolic parameters are consistent with Grade I diastolic dysfunction (impaired relaxation).  2. Right ventricular systolic function is normal. The right ventricular size is normal. There is normal pulmonary artery systolic pressure.  3. The mitral valve is normal in structure. Mild mitral valve regurgitation. No evidence of mitral stenosis.  4. The aortic valve is normal in structure. Aortic valve regurgitation is not visualized. No aortic stenosis is present.  5. The inferior vena cava is normal in size with greater  than 50% respiratory variability, suggesting right atrial pressure of 3 mmHg.  6. Agitated saline contrast bubble study was negative, with no evidence of any interatrial shunt. FINDINGS  Left Ventricle: Left ventricular ejection fraction, by estimation, is 60 to 65%. The left ventricle has normal function. The left ventricle has no regional wall motion abnormalities. The left ventricular internal cavity size was normal in size. There is  mild left ventricular hypertrophy. Left ventricular diastolic parameters are consistent with Grade I diastolic dysfunction (impaired relaxation). Right Ventricle: The right ventricular size is normal. No increase in right ventricular wall thickness. Right ventricular systolic function is normal. There is normal pulmonary artery systolic pressure. The tricuspid regurgitant velocity is 2.19 m/s, and  with an assumed right atrial pressure of 3 mmHg, the estimated right ventricular systolic pressure is 22.2 mmHg. Left Atrium: Left atrial size was normal in size. Right Atrium: Right atrial size was normal in size. Pericardium: There is no evidence of pericardial effusion. Mitral Valve: The mitral valve is normal in structure. Mild mitral valve regurgitation. No evidence of mitral valve stenosis. MV peak gradient, 3.7 mmHg. The mean mitral valve gradient is 2.0 mmHg. Tricuspid Valve: The tricuspid valve is normal in structure. Tricuspid valve regurgitation is mild . No evidence of tricuspid stenosis. Aortic Valve: The aortic valve is normal in structure. Aortic valve regurgitation is not visualized. No aortic stenosis is present. Aortic valve mean gradient measures 4.0 mmHg. Aortic valve peak gradient measures 9.9 mmHg. Aortic valve area, by VTI measures 2.54 cm. Pulmonic Valve: The pulmonic valve was normal in structure. Pulmonic valve regurgitation is mild. No evidence of pulmonic stenosis. Aorta: The aortic root is normal in size and  structure. Venous: The inferior vena cava is normal  in size with greater than 50% respiratory variability, suggesting right atrial pressure of 3 mmHg. IAS/Shunts: No atrial level shunt detected by color flow Doppler. Agitated saline contrast was given intravenously to evaluate for intracardiac shunting. Agitated saline contrast bubble study was negative, with no evidence of any interatrial shunt. There  is no evidence of a patent foramen ovale. There is no evidence of an atrial septal defect.  LEFT VENTRICLE PLAX 2D LVIDd:         4.60 cm   Diastology LVIDs:         3.00 cm   LV e' medial:    6.85 cm/s LV PW:         1.00 cm   LV E/e' medial:  12.1 LV IVS:        1.30 cm   LV e' lateral:   9.57 cm/s LVOT diam:     2.00 cm   LV E/e' lateral: 8.7 LV SV:         83 LV SV Index:   41 LVOT Area:     3.14 cm  RIGHT VENTRICLE RV Basal diam:  2.70 cm RV Mid diam:    2.60 cm RV S prime:     15.50 cm/s TAPSE (M-mode): 2.3 cm LEFT ATRIUM             Index        RIGHT ATRIUM           Index LA diam:        4.10 cm 2.01 cm/m   RA Area:     11.60 cm LA Vol (A2C):   54.2 ml 26.60 ml/m  RA Volume:   25.20 ml  12.37 ml/m LA Vol (A4C):   42.7 ml 20.96 ml/m LA Biplane Vol: 48.1 ml 23.61 ml/m  AORTIC VALVE                    PULMONIC VALVE AV Area (Vmax):    2.38 cm     PV Vmax:       1.08 m/s AV Area (Vmean):   2.40 cm     PV Peak grad:  4.7 mmHg AV Area (VTI):     2.54 cm AV Vmax:           157.00 cm/s AV Vmean:          91.800 cm/s AV VTI:            0.325 m AV Peak Grad:      9.9 mmHg AV Mean Grad:      4.0 mmHg LVOT Vmax:         119.00 cm/s LVOT Vmean:        70.000 cm/s LVOT VTI:          0.263 m LVOT/AV VTI ratio: 0.81  AORTA Ao Root diam: 3.10 cm MITRAL VALVE               TRICUSPID VALVE MV Area (PHT): 2.66 cm    TR Peak grad:   19.2 mmHg MV Area VTI:   2.51 cm    TR Vmax:        219.00 cm/s MV Peak grad:  3.7 mmHg MV Mean grad:  2.0 mmHg    SHUNTS MV Vmax:       0.96 m/s    Systemic VTI:  0.26 m MV Vmean:      62.4 cm/s   Systemic  Diam: 2.00 cm MV Decel Time: 285  msec MV E velocity: 82.80 cm/s MV A velocity: 95.90 cm/s MV E/A ratio:  0.86 Julien Nordmann MD Electronically signed by Julien Nordmann MD Signature Date/Time: 12/08/2022/12:52:49 PM    Final    CT ANGIO HEAD NECK W WO CM  Result Date: 12/08/2022 CLINICAL DATA:  Stroke/TIA, determine embolic source. EXAM: CT ANGIOGRAPHY HEAD AND NECK WITH AND WITHOUT CONTRAST TECHNIQUE: Multidetector CT imaging of the head and neck was performed using the standard protocol during bolus administration of intravenous contrast. Multiplanar CT image reconstructions and MIPs were obtained to evaluate the vascular anatomy. Carotid stenosis measurements (when applicable) are obtained utilizing NASCET criteria, using the distal internal carotid diameter as the denominator. RADIATION DOSE REDUCTION: This exam was performed according to the departmental dose-optimization program which includes automated exposure control, adjustment of the mA and/or kV according to patient size and/or use of iterative reconstruction technique. CONTRAST:  75mL OMNIPAQUE IOHEXOL 350 MG/ML SOLN COMPARISON:  Noncontrast CT head and brain MRI 1 day prior. FINDINGS: CT HEAD FINDINGS Brain: Patchy hypodensity is again seen in the left PCA distribution consistent with acute to early subacute infarcts common better seen on brain MRI from 1 day prior. There is no new acute territorial infarct. There is no acute intracranial hemorrhage or extra-axial fluid collection. Parenchymal volume is normal. The ventricles are normal in size. The expanded and empty sella is unchanged. There is no mass lesion there is no mass effect or midline shift. Vascular: See below. Skull: Normal. Negative for fracture or focal lesion. Sinuses/Orbits: The paranasal sinuses and mastoid air cells are clear. The globes and orbits are unremarkable. Other: None. Review of the MIP images confirms the above findings CTA NECK FINDINGS Aortic arch: The imaged aortic arch is normal. The origins of the  major branch vessels are patent. The subclavian arteries are patent to the level imaged. Right carotid system: The right common, internal, and external carotid arteries are patent, without hemodynamically significant stenosis or occlusion. There is no evidence of dissection or aneurysm. Left carotid system: The left common, internal, and external carotid arteries are patent, without hemodynamically significant stenosis or occlusion. There is no evidence of dissection or aneurysm. Vertebral arteries: The vertebral arteries are patent, without hemodynamically significant stenosis or occlusion. There is no evidence of dissection or aneurysm. Skeleton: There is no acute osseous abnormality or suspicious osseous lesion. There is no visible canal hematoma. Other neck: The soft tissues of the neck are unremarkable. Upper chest: The imaged lung apices are clear. Review of the MIP images confirms the above findings CTA HEAD FINDINGS Anterior circulation: The intracranial ICAs are patent, without significant stenosis. The bilateral MCAs are patent, without proximal stenosis or occlusion. The bilateral ACAs are patent, without proximal stenosis or occlusion. The anterior communicating artery is normal. There is no aneurysm or AVM. Posterior circulation: The bilateral V4 segments are patent. The basilar artery is patent. The major cerebellar arteries appear patent. There is short-segment critical stenosis/occlusion of the left proximal P2 segment with distal reconstitution. There is irregularity and narrowing of the PCA asked her the reconstitution. The right PCA is patent, without proximal stenosis or occlusion. A posterior communicating artery is identified on the right. There is no aneurysm or AVM. Venous sinuses: Patent. Anatomic variants: None. Review of the MIP images confirms the above findings IMPRESSION: 1. Patchy acute to early subacute infarcts in the left PCA distribution common better seen on brain MRI from 1 day  prior. 2. Short-segment  critical stenosis/occlusion of the proximal left P2 segment corresponding to the infarcts. 3. Otherwise patent vasculature of the head and neck without other hemodynamically significant stenosis or occlusion. Electronically Signed   By: Lesia Hausen M.D.   On: 12/08/2022 08:40   US Carotid Bilateral (at [facility] and [facility] only)  Result Date: 12/07/2022 CLINICAL DATA:  Scattered multiple small infarcts. EXAM: BILATERAL CAROTID DUPLEX ULTRASOUND TECHNIQUE: Wallace Cullens scale imaging, color Doppler and duplex ultrasound were performed of bilateral carotid and vertebral arteries in the neck. COMPARISON:  None Available. FINDINGS: Criteria: Quantification of carotid stenosis is based on velocity parameters that correlate the residual internal carotid diameter with NASCET-based stenosis levels, using the diameter of the distal internal carotid lumen as the denominator for stenosis measurement. The following velocity measurements were obtained: RIGHT ICA: 92/28 cm/sec CCA: 98/16 cm/sec SYSTOLIC ICA/CCA RATIO:  0.9 ECA: 105 cm/sec LEFT ICA: 84/22 cm/sec CCA: 90/21 cm/sec SYSTOLIC ICA/CCA RATIO:  0.9 ECA: 85 cm/sec RIGHT CAROTID ARTERY: Preliminary grayscale images show mild intimal thickening within the common carotid artery. No significant plaque formation is noted. Waveforms velocities and flow velocity ratios show no evidence of focal hemodynamically significant stenosis. RIGHT VERTEBRAL ARTERY:  Antegrade in nature. LEFT CAROTID ARTERY: Preliminary grayscale images demonstrate mild intimal thickening. No significant atherosclerotic plaque formation is noted. The waveforms, velocities and flow velocity ratios show no evidence of focal hemodynamically significant stenosis. LEFT VERTEBRAL ARTERY:  Antegrade in nature. IMPRESSION: No evidence of focal carotid stenosis. Electronically Signed   By: Alcide Clever M.D.   On: 12/07/2022 21:18   MR BRAIN WO CONTRAST  Result Date: 12/07/2022 CLINICAL DATA:  Blurry  vision and headache. Possible infarcts seen on noncontrast CT. EXAM: MRI HEAD WITHOUT CONTRAST TECHNIQUE: Multiplanar, multiecho pulse sequences of the brain and surrounding structures were obtained without intravenous contrast. COMPARISON:  Same-day head CT FINDINGS: Brain: There is patchy diffusion restriction in the left thalamus and cerebral peduncle, medial temporal lobe, and occipital lobe consistent with acute to early subacute infarct primarily in the PCA distribution. There is no hemorrhage or mass effect. There is no other acute infarct. There is no acute intracranial hemorrhage or extra-axial fluid collection. Parenchymal volume is normal. The ventricles are normal in size. Parenchymal signal is otherwise essentially normal with no significant burden of underlying chronic small-vessel ischemic change. The sella is mildly expanded and empty, nonspecific. There is no mass lesion. There is no mass effect or midline shift. Vascular: Normal flow voids. Skull and upper cervical spine: Normal marrow signal. Sinuses/Orbits: The paranasal sinuses are clear. The globes and orbits are unremarkable. Other: None. IMPRESSION: Patchy acute infarcts in the left thalamus and cerebral peduncle, medial temporal lobe, and occipital lobe consistent with acute to early subacute infarcts primarily in the PCA distribution without hemorrhage or mass effect. Electronically Signed   By: Lesia Hausen M.D.   On: 12/07/2022 16:18   CT HEAD WO CONTRAST ( )  Result Date: 12/07/2022 CLINICAL DATA:  Headache EXAM: CT HEAD WITHOUT CONTRAST TECHNIQUE: Contiguous axial images were obtained from the base of the skull through the vertex without intravenous contrast. RADIATION DOSE REDUCTION: This exam was performed according to the departmental dose-optimization program which includes automated exposure control, adjustment of the mA and/or kV according to patient size and/or use of iterative reconstruction technique. COMPARISON:  None  Available. FINDINGS: Brain: There is an asymmetric hypodensity in the medial aspect of the left occipital lobe which appears to involve the cortex. This could represent an infarct. No hemorrhage. No  extra-axial fluid collection. No hydrocephalus. Partially empty sella. Vascular: No hyperdense vessel or unexpected calcification. Skull: Normal. Negative for fracture or focal lesion. Sinuses/Orbits: No middle ear or mastoid effusion. Paranasal sinuses are clear. Orbits are unremarkable. Other: None. IMPRESSION: Asymmetric hypodensity in the medial aspect of the left occipital lobe appears to involve the cortex. This could represent an infarct. Recommend MRI of the brain with and without contrast for further evaluation. These results will be called to the ordering clinician or representative by the Radiologist Assistant, and communication documented in the PACS or [employer]. Electronically Signed   By: Lorenza Cambridge M.D.   On: 12/07/2022 11:45    Microbiology: Results for orders placed or performed in visit on 12/04/22  Urine Culture     Status: None   Collection Time: 12/04/22 11:36 AM   Specimen: Urine  Result Value Ref Range Status   MICRO NUMBER: [id number]  Final   SPECIMEN QUALITY: Adequate  Final   Sample Source URINE  Final   STATUS: FINAL  Final   Result: No Growth  Final    Labs: CBC: Recent Labs  Lab 12/02/22 1331 12/04/22 1102 12/07/22 1234  WBC 12.8* 11.3* 10.8*  NEUTROABS 10.4* 8,622*  --   HGB 13.9 12.1 12.8  HCT 40.8 37.4 37.5  MCV 72.5* 75.4* 71.4*  PLT 248 237 239   Basic Metabolic Panel: Recent Labs  Lab 12/02/22 1331 12/04/22 1102 12/07/22 1234  NA 133* 138 133*  K 3.3* 4.0 3.7  CL 99 102 101  CO2 25 27 23   GLUCOSE 247* 125* 196*  BUN 9 12 16   CREATININE 0.62 0.66 0.58  CALCIUM 9.1 9.2 8.8*   Liver Function Tests: Recent Labs  Lab 12/04/22 1102 12/07/22 1235  AST 12 26  ALT 11 23  ALKPHOS  --  84  BILITOT 0.8 1.2  PROT 6.7 7.2  ALBUMIN  --   4.0   CBG: Recent Labs  Lab 12/02/22 1221 12/07/22 1233 12/07/22 2230 12/08/22 0828 12/08/22 1659  GLUCAP 275* 198* 162* 156* 177*    Discharge time spent: greater than 30 minutes.  Signed: Alford Highland, MD [facility] 12/08/2022

## 2022-12-09 ENCOUNTER — Other Ambulatory Visit: Payer: Self-pay | Admitting: Nurse Practitioner

## 2022-12-09 DIAGNOSIS — I10 Essential (primary) hypertension: Secondary | ICD-10-CM

## 2022-12-09 DIAGNOSIS — E119 Type 2 diabetes mellitus without complications: Secondary | ICD-10-CM

## 2022-12-10 ENCOUNTER — Other Ambulatory Visit: Payer: Self-pay

## 2022-12-10 ENCOUNTER — Emergency Department: Payer: No Typology Code available for payment source

## 2022-12-10 ENCOUNTER — Emergency Department
Admission: EM | Admit: 2022-12-10 | Discharge: 2022-12-10 | Disposition: A | Discharge status: Home / Self Care | Payer: No Typology Code available for payment source | Attending: Emergency Medicine | Admitting: Emergency Medicine

## 2022-12-10 DIAGNOSIS — R42 Dizziness and giddiness: Secondary | ICD-10-CM | POA: Diagnosis not present

## 2022-12-10 DIAGNOSIS — R531 Weakness: Secondary | ICD-10-CM | POA: Diagnosis present

## 2022-12-10 DIAGNOSIS — I10 Essential (primary) hypertension: Secondary | ICD-10-CM | POA: Diagnosis not present

## 2022-12-10 DIAGNOSIS — E119 Type 2 diabetes mellitus without complications: Secondary | ICD-10-CM | POA: Insufficient documentation

## 2022-12-10 LAB — BASIC METABOLIC PANEL
Anion gap: 7 (ref 5–15)
BUN: 17 mg/dL (ref 6–20)
CO2: 23 mmol/L (ref 22–32)
Calcium: 8.9 mg/dL (ref 8.9–10.3)
Chloride: 103 mmol/L (ref 98–111)
Creatinine, Ser: 0.73 mg/dL (ref 0.44–1.00)
GFR, Estimated: >60 mL/min (ref >60)
Glucose, Bld: 240 mg/dL — ABNORMAL HIGH (ref 70–99)
Potassium: 4.1 mmol/L (ref 3.5–5.1)
Sodium: 133 mmol/L — ABNORMAL LOW (ref 135–145)

## 2022-12-10 LAB — URINALYSIS, ROUTINE W REFLEX MICROSCOPIC
Bilirubin Urine: NEGATIVE
Glucose, UA: 500 mg/dL — AB
Hgb urine dipstick: NEGATIVE
Leukocytes,Ua: NEGATIVE
Nitrite: NEGATIVE
Protein, ur: NEGATIVE mg/dL
Specific Gravity, Urine: 1.025 (ref 1.005–1.030)
pH: 6.5 (ref 5.0–8.0)

## 2022-12-10 LAB — CBC
HCT: 37.8 % (ref 36.0–46.0)
Hemoglobin: 12.9 g/dL (ref 12.0–15.0)
MCH: 24.8 pg — ABNORMAL LOW (ref 26.0–34.0)
MCHC: 34.1 g/dL (ref 30.0–36.0)
MCV: 72.6 fL — ABNORMAL LOW (ref 80.0–100.0)
Platelets: 254 10*3/uL (ref 150–400)
RBC: 5.21 MIL/uL — ABNORMAL HIGH (ref 3.87–5.11)
RDW: 15.6 % — ABNORMAL HIGH (ref 11.5–15.5)
WBC: 11.3 10*3/uL — ABNORMAL HIGH (ref 4.0–10.5)
nRBC: 0 % (ref 0.0–0.2)

## 2022-12-10 LAB — CBG MONITORING, ED: Glucose-Capillary: 173 mg/dL — ABNORMAL HIGH (ref 70–99)

## 2022-12-10 MED ORDER — MIDAZOLAM HCL 2 MG/2ML IJ SOLN
2.0000 mg | Freq: Once | INTRAMUSCULAR | Status: AC
  Administered 2022-12-10: 2 mg via INTRAVENOUS
  Filled 2022-12-10: qty 2

## 2022-12-10 NOTE — ED Provider Notes (Signed)
[facility] Provider Note    Event Date/Time   First MD Initiated Contact with Patient 12/10/22 1810     (approximate)   History   Weakness   HPI  Elizabeth Scott is a 62 y.o. female past medical history significant for recent acute left PCA stroke, hypertension, hyperlipidemia, diabetes, GERD, who presents to the emergency department with weakness and dizziness.  Patient had a recent hospitalization was discharged from the hospital 2 days ago after having a left-sided PCA stroke.  Patient's presentation with headache and dizziness with slight visual changes to the right eye.  Patient had an MRI of the brain that showed patchy acute infarcts.  Patient was evaluated by cardiology and neurology and ultimately cleared to go home on Friday.  States that her symptoms had improved and she was able to ambulate without any difficulties.  States that she did well for the next 2 days and today when she went to get out of bed felt very bad.  States that she felt like her legs were very heavy and that she could not move.  Felt like it was difficult to get out of bed.  Felt nauseous with some slight lightheadedness.  Since arriving to the emergency department has felt a little bit better.  States that this is different from the symptoms she had when she was diagnosed with a stroke.  Endorses compliance with all of her home medications.  No recent falls or head trauma.  Denies any change in vision at this time.  No altered mental status.  No dysuria, urinary urgency or frequency.  No abdominal pain or chest pain.     Physical Exam   Triage Vital Signs: ED Triage Vitals  Enc Vitals Group     BP 12/10/22 1312 (!) 177/67     Pulse Rate 12/10/22 1312 90     Resp 12/10/22 1312 18     Temp 12/10/22 1315 98.1 F (36.7 C)     Temp Source 12/10/22 1315 Oral     SpO2 12/10/22 1312 100 %     Weight 12/10/22 1313 226 lb 3.1 oz (102.6 kg)     Height 12/10/22 1313 5\' 3"  (1.6 m)      Head Circumference --      Peak Flow --      Pain Score 12/10/22 1313 0     Pain Loc --      Pain Edu? --      Excl. in GC? --     Most recent vital signs: Vitals:   12/10/22 1315 12/10/22 1856  BP:  (!) 176/87  Pulse:  77  Resp:  16  Temp: 98.1 F (36.7 C) 97.8 F (36.6 C)  SpO2:  100%    Physical Exam Constitutional:      Appearance: She is well-developed.  HENT:     Head: Atraumatic.  Eyes:     Conjunctiva/sclera: Conjunctivae normal.  Cardiovascular:     Rate and Rhythm: Regular rhythm.  Pulmonary:     Effort: No respiratory distress.  Abdominal:     General: There is no distension.  Musculoskeletal:        General: Normal range of motion.     Cervical back: Normal range of motion.  Skin:    General: Skin is warm.  Neurological:     Mental Status: She is alert. Mental status is at baseline.     GCS: GCS eye subscore is 4. GCS verbal subscore is 5.  GCS motor subscore is 6.     Cranial Nerves: Cranial nerves 2-12 are intact.     Sensory: Sensation is intact.     Motor: Motor function is intact.     Coordination: Coordination is intact.     Comments: Slight abnormal gait but able to ambulate without any significant difficulties     IMPRESSION / MDM / ASSESSMENT AND PLAN / ED COURSE  I reviewed the triage vital signs and the nursing notes.  Differential diagnosis including hyperglycemia, electrolyte abnormalities, dehydration, intracranial hemorrhage, CVA, sequela of CVA    RADIOLOGY I independently reviewed imaging, my interpretation of imaging: CT scan of the head my review without signs of intracranial hemorrhage.  No obvious infarct.  Read as no acute intracranial abnormality.  Small areas of hypodensity to the left occipital lobe compatible with old infarcts correlating to prior MRI findings.  LABS (all labs ordered are listed, but only abnormal results are displayed) Labs interpreted as -    Labs Reviewed  BASIC METABOLIC PANEL - Abnormal;  Notable for the following components:      Result Value   Sodium 133 (*)    Glucose, Bld 240 (*)    All other components within normal limits  CBC - Abnormal; Notable for the following components:   WBC 11.3 (*)    RBC 5.21 (*)    MCV 72.6 (*)    MCH 24.8 (*)    RDW 15.6 (*)    All other components within normal limits  URINALYSIS, ROUTINE W REFLEX MICROSCOPIC - Abnormal; Notable for the following components:   Glucose, UA 500 (*)    Ketones, ur TRACE (*)    All other components within normal limits  CBG MONITORING, ED - Abnormal; Notable for the following components:   Glucose-Capillary 173 (*)    All other components within normal limits  CBG MONITORING, ED     MDM On arrival patient with hyperglycemia.  No other significant electrolyte abnormality.  Does not meet criteria for DKA.  No signs of urinary tract infection.  Repeat glucose down trended to 173 without any intervention.  Patient currently has a nonfocal neurologic exam.  CT scan of the head without signs of intracranial hemorrhage or infarction.  No altered mental status or confusion at this time.  No signs of an infectious process.  No concern for meningitis.  Abdomen is nontender to palpation.  Patient currently is able to ambulate but having some slight gait instability.  Clinical Course as of 12/10/22 2130  Wynelle Link Dec 10, 2022  1947 Discussed with Dr. Selina Cooley with neurology who recommended repeating MRI brain without contrast.  Patient pretreated with IV Versed for anxiety. [SM]    Clinical Course User Index [SM] Corena Herter, MD    On reevaluation patient continues to be asymptomatic.  Dr. Selina Cooley stated that if the patient did not have any new findings of new CVA that she could be discharged home with close follow-up with neurology and cardiology.  Any new CVA recommended admission because she would need a TEE.  MRI of the brain without -discussed the finding with radiology, no new CVA, prior CVA noted with no  acute findings.  Patient will be discharged home in stable condition.  Patient will follow-up closely with neurology and cardiology.  Given return precautions for any worsening symptoms.  Discussed tight glucose control.   PROCEDURES:  Critical Care performed: No  Procedures  Patient's presentation is most consistent with acute presentation with potential threat to  life or bodily function.   MEDICATIONS ORDERED IN ED: Medications  midazolam (VERSED) injection 2 mg (2 mg Intravenous Given 12/10/22 2015)    FINAL CLINICAL IMPRESSION(S) / ED DIAGNOSES   Final diagnoses:  Weakness  Dizziness     Rx / DC Orders   ED Discharge Orders     None        Note:  This document was prepared using Dragon voice recognition software and may include unintentional dictation errors.   Corena Herter, MD 12/10/22 2130

## 2022-12-10 NOTE — ED Triage Notes (Signed)
Pt presents to ED with complains of weakness. Pt was discharged from hospital 2 days ago after having a stroke. Pt states she went to bed last night around 1130pm and felt fine. Woke up this morning feeling weak and as the morning progressed weakness got worse and had trouble getting up. Denies any falls.

## 2022-12-10 NOTE — ED Notes (Signed)
EDP at [facility]. Pending neuro consult. Ambulated with steady gait, but felt like gait was off, and different than previously. States, feel better now than earlier today. Earlier she felt BLE heaviness, some R lateral blurry vision, some dizziness.

## 2022-12-10 NOTE — Discharge Instructions (Signed)
You are seen in the emergency department today for an episode of dizziness.  You had a repeat MRI done of your brain that did not show any new strokes.  Did show old strokes.  Follow-up closely with neurology and cardiology.  Return to the emergency department for any worsening symptoms.  Make sure to check your glucose frequently and have good glucose control.

## 2022-12-11 NOTE — Telephone Encounter (Signed)
Requested Prescriptions  Pending Prescriptions Disp Refills   losartan (COZAAR) 50 MG tablet [Pharmacy Med Name: LOSARTAN POTASSIUM 50 MG TAB] 90 tablet 0    Sig: TAKE 1 TABLET BY MOUTH EVERY DAY     Cardiovascular:  Angiotensin Receptor Blockers Failed - 12/09/2022  8:35 AM      Failed - Last BP in normal range    BP Readings from Last 1 Encounters:  12/10/22 (!) 174/79         Passed - Cr in normal range and within 180 days    Creat  Date Value Ref Range Status  12/04/2022 0.66 0.50 - 1.05 mg/dL Final   Creatinine, Ser  Date Value Ref Range Status  12/10/2022 0.73 0.44 - 1.00 mg/dL Final   Creatinine, Urine  Date Value Ref Range Status  11/07/2022 118 20 - 275 mg/dL Final         Passed - K in normal range and within 180 days    Potassium  Date Value Ref Range Status  12/10/2022 4.1 3.5 - 5.1 mmol/L Final         Passed - Patient is not pregnant      Passed - Valid encounter within last 6 months    Recent Outpatient Visits           4 days ago Acute nonintractable headache, unspecified headache type   [facility] Berniece Salines, [name]   1 week ago Type 2 diabetes mellitus without complication, without long-term current use of insulin (HCC)   [facility] Mecum, [name], [name]   1 month ago Annual physical exam   [facility] Berniece Salines, [name]   6 months ago Acute cough   [facility] Berniece Salines, [name]   7 months ago COVID-19   [facility] Berniece Salines, [name]       Future Appointments             In 2 months [name], [name], [name] [facility], [name]   In 11 months [name], [name], [name] [facility], [facility]

## 2022-12-13 LAB — HM DIABETES EYE EXAM

## 2022-12-14 ENCOUNTER — Ambulatory Visit
Admission: RE | Admit: 2022-12-14 | Discharge: 2022-12-14 | Disposition: A | Discharge status: Home / Self Care | Payer: No Typology Code available for payment source | Source: Ambulatory Visit | Attending: Nurse Practitioner | Admitting: Nurse Practitioner

## 2022-12-14 DIAGNOSIS — I639 Cerebral infarction, unspecified: Secondary | ICD-10-CM | POA: Diagnosis not present

## 2022-12-14 DIAGNOSIS — Z0000 Encounter for general adult medical examination without abnormal findings: Secondary | ICD-10-CM | POA: Insufficient documentation

## 2022-12-14 DIAGNOSIS — Z1231 Encounter for screening mammogram for malignant neoplasm of breast: Secondary | ICD-10-CM | POA: Insufficient documentation

## 2022-12-15 ENCOUNTER — Encounter: Payer: Self-pay | Admitting: Nurse Practitioner

## 2022-12-15 ENCOUNTER — Ambulatory Visit: Payer: No Typology Code available for payment source | Admitting: Nurse Practitioner

## 2022-12-15 ENCOUNTER — Other Ambulatory Visit: Payer: Self-pay

## 2022-12-15 VITALS — BP 133/88 | HR 92 | Temp 98.4°F | Resp 16 | Ht 63.0 in | Wt 231.4 lb

## 2022-12-15 DIAGNOSIS — Z8673 Personal history of transient ischemic attack (TIA), and cerebral infarction without residual deficits: Secondary | ICD-10-CM

## 2022-12-15 DIAGNOSIS — Z09 Encounter for follow-up examination after completed treatment for conditions other than malignant neoplasm: Secondary | ICD-10-CM | POA: Diagnosis not present

## 2022-12-15 NOTE — Progress Notes (Signed)
BP 133/88   Pulse 92   Temp 98.4 F (36.9 C) (Oral)   Resp 16   Ht 5\' 3"  (1.6 m)   Wt 231 lb 6.4 oz (105 kg)   SpO2 97%   BMI 40.99 kg/m    Subjective:    Patient ID: Elizabeth Scott, female    DOB: October 30, 1960, 62 y.o.   MRN: [id number]  HPI: Elizabeth Scott is a 62 y.o. female  Chief Complaint  Patient presents with   Hospitalization Follow-up   Hospital follow up/Acute left PCA stroke: she was send to the er on 12/07/2022 with c/o dizziness, headache and changes in vision.  After stat ct scan. She was positive for stroke and admitted. She was discharged on 12/08/2022. She was then back in the er on 12/10/2022 due to recurrent dizziness. They scan her again and did not see anything new. She was discharged and instructed to follow up with neurology and cardiology. Med changes: she was started on plavix and asa 81 mg daily and her omeprazole was stopped and she is started on protonix. She reports she has been doing well. She says she still has some dizziness and vision changes in the left eye.  She did go and see her eye doctor and they ordered her a new prescription.   She was wondering if she should go back to work. She has an appointment with neurology on Wednesday, recommend she be cleared by them.  Note provided. Discussed signs and symptoms of a stroke and that time loss is brain loss. Patient and husband verbalized understanding.  Her follow up with neurology is 12/20/2022.  Her follow up with cardiology is 01/23/2023 to discuss heart monitor Heart monitor she is currently wearing it.   Ct showed: Asymmetric hypodensity in the medial aspect of the left occipital lobe appears to involve the cortex. This could represent an infarct. Recommend MRI of the brain with and without contrast for further evaluation.  MRI brain: Patchy acute infarcts in the left thalamus and cerebral peduncle, medial temporal lobe, and occipital lobe consistent with acute to early subacute  infarcts primarily in the PCA distribution without hemorrhage or mass effect.  Carotid ultrasound: No evidence of focal carotid stenosis.   Ct angio head/neck: 1. Patchy acute to early subacute infarcts in the left PCA distribution common better seen on brain MRI from 1 day prior. 2. Short-segment critical stenosis/occlusion of the proximal left P2 segment corresponding to the infarcts. 3. Otherwise patent vasculature of the head and neck without other hemodynamically significant stenosis or occlusion.  Relevant past medical, surgical, family and social history reviewed and updated as indicated. Interim medical history since our last visit reviewed. Allergies and medications reviewed and updated.  Review of Systems  Constitutional: Negative for fever or weight change.  Respiratory: Negative for cough and shortness of breath.   Cardiovascular: Negative for chest pain or palpitations.  Gastrointestinal: Negative for abdominal pain, no bowel changes.  Musculoskeletal: Negative for gait problem or joint swelling.  Skin: Negative for rash.  Neurological: Negative for dizziness or headache.  No other specific complaints in a complete review of systems (except as listed in HPI above).      Objective:    BP 133/88   Pulse 92   Temp 98.4 F (36.9 C) (Oral)   Resp 16   Ht 5\' 3"  (1.6 m)   Wt 231 lb 6.4 oz (105 kg)   SpO2 97%   BMI 40.99 kg/m   Wt  Readings from Last 3 Encounters:  12/15/22 231 lb 6.4 oz (105 kg)  12/10/22 226 lb 3.1 oz (102.6 kg)  12/07/22 226 lb 3.1 oz (102.6 kg)    Physical Exam  Constitutional: Patient appears well-developed and well-nourished. Obese  No distress.  HEENT: head atraumatic, normocephalic, pupils equal and reactive to light, neck supple, throat within normal limits Cardiovascular: Normal rate, regular rhythm and normal heart sounds.  No murmur heard. No BLE edema. Pulmonary/Chest: Effort normal and breath sounds normal. No respiratory  distress. Abdominal: Soft.  There is no tenderness. Psychiatric: Patient has a normal mood and affect. behavior is normal. Judgment and thought content normal.  Results for orders placed or performed during the hospital encounter of 12/10/22  Basic metabolic panel  Result Value Ref Range   Sodium 133 (L) 135 - 145 mmol/L   Potassium 4.1 3.5 - 5.1 mmol/L   Chloride 103 98 - 111 mmol/L   CO2 23 22 - 32 mmol/L   Glucose, Bld 240 (H) 70 - 99 mg/dL   BUN 17 6 - 20 mg/dL   Creatinine, Ser 1.61 0.44 - 1.00 mg/dL   Calcium 8.9 8.9 - 09.6 mg/dL   GFR, Estimated >04 >54 mL/min   Anion gap 7 5 - 15  CBC  Result Value Ref Range   WBC 11.3 (H) 4.0 - 10.5 K/uL   RBC 5.21 (H) 3.87 - 5.11 MIL/uL   Hemoglobin 12.9 12.0 - 15.0 g/dL   HCT 09.8 11.9 - 14.7 %   MCV 72.6 (L) 80.0 - 100.0 fL   MCH 24.8 (L) 26.0 - 34.0 pg   MCHC 34.1 30.0 - 36.0 g/dL   RDW 82.9 (H) 56.2 - 13.0 %   Platelets 254 150 - 400 K/uL   nRBC 0.0 0.0 - 0.2 %  Urinalysis, Routine w reflex microscopic -Urine, Clean Catch  Result Value Ref Range   Color, Urine YELLOW YELLOW   APPearance CLEAR CLEAR   Specific Gravity, Urine 1.025 1.005 - 1.030   pH 6.5 5.0 - 8.0   Glucose, UA 500 (A) NEGATIVE mg/dL   Hgb urine dipstick NEGATIVE NEGATIVE   Bilirubin Urine NEGATIVE NEGATIVE   Ketones, ur TRACE (A) NEGATIVE mg/dL   Protein, ur NEGATIVE NEGATIVE mg/dL   Nitrite NEGATIVE NEGATIVE   Leukocytes,Ua NEGATIVE NEGATIVE  CBG monitoring, ED  Result Value Ref Range   Glucose-Capillary 173 (H) 70 - 99 mg/dL      Assessment & Plan:   Problem List Items Addressed This Visit   None Visit Diagnoses     History of stroke    -  Primary   doing bettter, has appointment with neurology on 5/22 and cardiology on 6/25. continue to take asa 81 mg daily and plavix 75 mg daily.   Hospital discharge follow-up       doing bettter, has appointment with neurology on 5/22 and cardiology on 6/25. continue to take asa 81 mg daily and plavix 75 mg  daily.        Follow up plan: Return in about 3 months (around 03/17/2023) for follow up.

## 2022-12-19 ENCOUNTER — Encounter: Payer: Self-pay | Admitting: Nurse Practitioner

## 2022-12-20 ENCOUNTER — Telehealth: Payer: Self-pay | Admitting: Nurse Practitioner

## 2022-12-20 NOTE — Telephone Encounter (Signed)
Pt called to report that her insulin and Alma Friendly are now too expensive and she needs an alternative that is more affordable.   [id number] - [place], [place] - [address] AT [facility]  [address]  [place] [address]  Phone: [phone] Fax: [phone]

## 2022-12-21 ENCOUNTER — Other Ambulatory Visit: Payer: Self-pay | Admitting: Nurse Practitioner

## 2022-12-21 ENCOUNTER — Other Ambulatory Visit: Payer: Self-pay

## 2022-12-21 DIAGNOSIS — E119 Type 2 diabetes mellitus without complications: Secondary | ICD-10-CM

## 2022-12-21 MED ORDER — OZEMPIC (0.25 OR 0.5 MG/DOSE) 2 MG/3ML ~~LOC~~ SOPN
0.2500 mg | PEN_INJECTOR | SUBCUTANEOUS | 0 refills | Status: DC
Start: 2022-12-21 — End: 2023-02-12

## 2022-12-21 MED ORDER — LINAGLIPTIN 5 MG PO TABS
5.0000 mg | ORAL_TABLET | Freq: Every day | ORAL | 1 refills | Status: DC
Start: 2022-12-21 — End: 2023-03-16

## 2022-12-21 NOTE — Telephone Encounter (Signed)
Elizabeth Scott is to expensive. Insurance no longers cover the Januvia she had been on

## 2022-12-21 NOTE — Telephone Encounter (Signed)
Requested medications are due for refill today.  Ordered today see pharm note  Requested medications are on the active medications list.  yes  Last refill. today  Future visit scheduled.   yes  Notes to clinic.   Pharmacy comment: Alternative Requested:PRIOR AUTH REQUIRED.   All Pharmacy Suggested Alternatives:  saxagliptin HCl (ONGLYZA) 5 MG TABS tablet sitaGLIPtin (JANUVIA) 100 MG tablet      Requested Prescriptions  Pending Prescriptions Disp Refills   saxagliptin HCl (ONGLYZA) 5 MG TABS tablet [Pharmacy Med Name: SAXAGLIPTIN HCL 5 MG TABLET]  0     Endocrinology:  Diabetes - DPP-4 Inhibitors Failed - 12/21/2022  5:07 PM      Failed - HBA1C is between 0 and 7.9 and within 180 days    Hgb A1c MFr Bld  Date Value Ref Range Status  11/07/2022 8.4 (H) <5.7 % of total Hgb Final    Comment:    For someone without known diabetes, a hemoglobin A1c value of 6.5% or greater indicates that they may have  diabetes and this should be confirmed with a follow-up  test. . For someone with known diabetes, a value <7% indicates  that their diabetes is well controlled and a value  greater than or equal to 7% indicates suboptimal  control. A1c targets should be individualized based on  duration of diabetes, age, comorbid conditions, and  other considerations. . Currently, no consensus exists regarding use of hemoglobin A1c for diagnosis of diabetes for children. .          Passed - Cr in normal range and within 360 days    Creat  Date Value Ref Range Status  12/04/2022 0.66 0.50 - 1.05 mg/dL Final   Creatinine, Ser  Date Value Ref Range Status  12/10/2022 0.73 0.44 - 1.00 mg/dL Final   Creatinine, Urine  Date Value Ref Range Status  11/07/2022 118 20 - 275 mg/dL Final         Passed - Valid encounter within last 6 months    Recent Outpatient Visits           6 days ago History of stroke   [name], [name]   2 weeks ago Acute  nonintractable headache, unspecified headache type   [facility] Della Goo F, [name]   2 weeks ago Type 2 diabetes mellitus without complication, without long-term current use of insulin [facility])   [facility] Mecum, [name], [name]   1 month ago Annual physical exam   [facility] Berniece Salines, [name]   7 months ago Acute cough   [facility] Berniece Salines, [name]       Future Appointments             In 3 weeks Brion Aliment, Dois Davenport, NP [facility] [place]   In 1 month Sherie Don, NP [facility] [place]   In 2 months [name], [name], [name] [facility], [name]   In 10 months [name], [name], [name] [facility], [facility]

## 2022-12-21 NOTE — Telephone Encounter (Signed)
Spoke to patient, she will call insurance company to see what they will pay for

## 2022-12-22 NOTE — Telephone Encounter (Signed)
Copied from CRM [id number]. Topic: General - Other >> Dec 22, 2022 12:47 PM Elizabeth Scott wrote: Reason for CRM: The patient has called to follow up on their refill request of saxagliptin HCl (ONGLYZA) 5 MG TABS tablet [Pharmacy Med Name: SAXAGLIPTIN HCL 5 MG TABLET] and sitaGLIPtin (JANUVIA) 100 MG tablet  The patient would like to be notified when the prior authorizations of the medications have been completed   Please contact the patient further when possible

## 2022-12-23 ENCOUNTER — Other Ambulatory Visit: Payer: Self-pay | Admitting: Nurse Practitioner

## 2022-12-23 DIAGNOSIS — E119 Type 2 diabetes mellitus without complications: Secondary | ICD-10-CM

## 2022-12-26 ENCOUNTER — Telehealth: Payer: Self-pay | Admitting: Nurse Practitioner

## 2022-12-26 NOTE — Telephone Encounter (Signed)
Patient notified Ozempic is approved and a copy of letter was faxed to pharmacy.

## 2022-12-26 NOTE — Telephone Encounter (Signed)
Requested medications are due for refill today.  See pharmacy note  Requested medications are on the active medications list.  no  Last refill. 12/21/2022 - not filled  Future visit scheduled.   yes  Notes to clinic.  Pharmacy comment: Alternative Requested:NOT COVERED, CONSIDER PA OR ALTERNATIVE.    Requested Prescriptions  Pending Prescriptions Disp Refills   saxagliptin HCl (ONGLYZA) 5 MG TABS tablet [Pharmacy Med Name: SAXAGLIPTIN HCL 5 MG TABLET]  0     Endocrinology:  Diabetes - DPP-4 Inhibitors Failed - 12/23/2022  5:44 PM      Failed - HBA1C is between 0 and 7.9 and within 180 days    Hgb A1c MFr Bld  Date Value Ref Range Status  11/07/2022 8.4 (H) <5.7 % of total Hgb Final    Comment:    For someone without known diabetes, a hemoglobin A1c value of 6.5% or greater indicates that they may have  diabetes and this should be confirmed with a follow-up  test. . For someone with known diabetes, a value <7% indicates  that their diabetes is well controlled and a value  greater than or equal to 7% indicates suboptimal  control. A1c targets should be individualized based on  duration of diabetes, age, comorbid conditions, and  other considerations. . Currently, no consensus exists regarding use of hemoglobin A1c for diagnosis of diabetes for children. .          Passed - Cr in normal range and within 360 days    Creat  Date Value Ref Range Status  12/04/2022 0.66 0.50 - 1.05 mg/dL Final   Creatinine, Ser  Date Value Ref Range Status  12/10/2022 0.73 0.44 - 1.00 mg/dL Final   Creatinine, Urine  Date Value Ref Range Status  11/07/2022 118 20 - 275 mg/dL Final         Passed - Valid encounter within last 6 months    Recent Outpatient Visits           1 week ago History of stroke   [facility] Della Goo F, [name]   2 weeks ago Acute nonintractable headache, unspecified headache type   [facility] Della Goo F, [name]   3 weeks ago Type 2 diabetes mellitus without complication, without long-term current use of insulin [facility])   [facility] Mecum, [name], [name]   1 month ago Annual physical exam   [facility] Berniece Salines, [name]   7 months ago Acute cough   [facility] Berniece Salines, [name]       Future Appointments             In 3 weeks Brion Aliment, Dois Davenport, NP [facility] [place]   In 4 weeks Sherie Don, NP [facility] [place]   In 2 months [name], [name], [name] [facility], [name]   In 10 months [name], [name], [name] [facility], [facility]

## 2022-12-26 NOTE — Telephone Encounter (Signed)
Copied from CRM [id number]. Topic: General - Other >> Dec 26, 2022  9:29 AM Franchot Heidelberg wrote: Reason for CRM: Pt called requesting to speak to [facility] about Ozempic, has questions for her specifically   Best contact: [phone]

## 2023-01-02 ENCOUNTER — Telehealth: Payer: Self-pay | Admitting: Nurse Practitioner

## 2023-01-02 NOTE — Telephone Encounter (Unsigned)
Copied from CRM [id number]. Topic: General - Other >> Jan 02, 2023  9:55 AM Everette C wrote: Reason for CRM: The patient's husband would like to be contacted when possible to further discuss the completion of previously submitted FMLA paperwork   Please contact further when possible

## 2023-01-03 NOTE — Telephone Encounter (Signed)
Pt husband is calling to follow up on his FMLA paperwork; he stated that he does not want to lose his job. Is requesting an update today and a callback if possible. Please advise.

## 2023-01-03 NOTE — Telephone Encounter (Signed)
Left message for patient

## 2023-01-17 ENCOUNTER — Ambulatory Visit: Payer: No Typology Code available for payment source | Admitting: Nurse Practitioner

## 2023-01-22 ENCOUNTER — Other Ambulatory Visit: Payer: Self-pay | Admitting: Nurse Practitioner

## 2023-01-22 DIAGNOSIS — E119 Type 2 diabetes mellitus without complications: Secondary | ICD-10-CM

## 2023-01-22 DIAGNOSIS — I10 Essential (primary) hypertension: Secondary | ICD-10-CM

## 2023-01-22 NOTE — Progress Notes (Unsigned)
Cardiology Office Note Date:  01/23/2023  Patient ID:  Elizabeth Scott, Elizabeth Scott October 25, 1960, MRN [id number] PCP:  Berniece Salines, [name]  Cardiologist:  None Electrophysiologist: Lanier Prude, MD, though has not met    Chief Complaint: cryptogenic stroke  History of Present Illness: Elizabeth Scott is a 62 y.o. female with PMH notable for HTN, HLD, CVA x 2; seen today for Lanier Prude, MD for post hospital follow up.   She presented to [facility] ER 5/9 and 12/10/2022 with CVA without identifiable cause.  Lower extremity ultrasound, carotid ultrasound, and TTE performed inpatient were all unremarkable.  She wore an ambulatory monitor at discharge that did not reveal atrial fibrillation.  She presents today for further arrhythmia discussion.  Patient has never been told that she has AFib or Aflutter.  She has seen PCP for BP and HLD management. She does have intermittent palpitations, feels her heart "flip flopping" for a beat or two in her chest. Episodes are very brief.    she denies chest pain,  dyspnea, PND, orthopnea, nausea, vomiting, dizziness, syncope, edema, weight gain, or early satiety.     Past Medical History:  Diagnosis Date   Allergy    Chronic sinusitis    COVID-19    Diabetes mellitus without complication (HCC)    GERD (gastroesophageal reflux disease)    High cholesterol    History of abnormal cervical Pap smear    Prior to Hysterectomy, normal paps for 2-3 years after Hysterectomy   History of gestational diabetes    Hypertension    Hypopotassemia    Microcytosis     Past Surgical History:  Procedure Laterality Date   ABDOMINAL HYSTERECTOMY  2003   still has ovaries-for menorrhagia   CESAREAN SECTION     COLONOSCOPY  02/22/14   repeat 5 years   ESOPHAGOGASTRODUODENOSCOPY  02/22/14   TUBAL LIGATION      Current Outpatient Medications  Medication Instructions   albuterol (PROVENTIL HFA;VENTOLIN HFA) 108 (90 BASE) MCG/ACT inhaler 2 puffs,  Inhalation, Every 2 hours PRN   amLODipine (NORVASC) 5 mg, Oral, Daily   aspirin EC 81 mg, Oral, Daily   atorvastatin (LIPITOR) 80 mg, Oral, Daily   clopidogrel (PLAVIX) 75 mg, Oral, Every evening   ezetimibe (ZETIA) 10 mg, Oral, Daily   glipiZIDE (GLUCOTROL XL) 10 mg, Oral, 2 times daily   Glucagon (GVOKE HYPOPEN 2-PACK) 0.5 MG/0.1ML SOAJ 1 each, Subcutaneous, As needed   linagliptin (TRADJENTA) 5 mg, Oral, Daily   losartan (COZAAR) 50 mg, Oral, Daily   methocarbamol (ROBAXIN) 500 mg, Oral, 2 times daily PRN   Ozempic (0.25 or 0.5 MG/DOSE) 0.25 mg, Subcutaneous, Weekly   pantoprazole (PROTONIX) 40 mg, Oral, Daily   pioglitazone (ACTOS) 15 mg, Oral, Daily    Social History:  The patient  reports that she has never smoked. She has never used smokeless tobacco. She reports that she does not drink alcohol and does not use drugs.   Family History:  The patient's family history includes Asthma in her sister and sister; Cancer in her paternal grandfather; Congestive Heart Failure in her maternal grandmother; Diabetes in her father, mother, sister, and sister; Heart disease in her brother and mother; Hypertension in her brother, father, mother, sister, and sister; Lung disease in her sister; Stroke in her brother and mother.  ROS:  Please see the history of present illness. All other systems are reviewed and otherwise negative.   PHYSICAL EXAM:  VS:  BP 130/80 (BP Location: Left  Arm, Patient Position: Sitting, Cuff Size: Large)   Pulse 79   Ht 5\' 3"  (1.6 m)   Wt 228 lb (103.4 kg)   SpO2 98%   BMI 40.39 kg/m  BMI: Body mass index is 40.39 kg/m.  GEN- The patient is well appearing, alert and oriented x 3 today.   Lungs- Clear to ausculation bilaterally, normal work of breathing.  Heart- Regular rate and rhythm, no murmurs, rubs or gallops Extremities- Trace peripheral edema, warm, dry   EKG is ordered. Personal review of EKG from today shows:  NSR, rate 79bpm  5/10 and 5/9 EKGs also  reviewed, no AFib     Recent Labs: 12/07/2022: ALT 23; TSH 2.082 12/10/2022: BUN 17; Creatinine, Ser 0.73; Hemoglobin 12.9; Platelets 254; Potassium 4.1; Sodium 133  12/08/2022: Cholesterol 139; HDL 39; LDL Cholesterol 78; Total CHOL/HDL Ratio 3.6; Triglycerides 111; VLDL 22   CrCl cannot be calculated (Patient's most recent lab result is older than the maximum 21 days allowed.).   Wt Readings from Last 3 Encounters:  01/23/23 228 lb (103.4 kg)  12/15/22 231 lb 6.4 oz (105 kg)  12/10/22 226 lb 3.1 oz (102.6 kg)     Additional studies reviewed include: Previous EP, cardiology notes.   Long term monitor, 01/04/2023 HR 56 - 188, averate 83 bpm. 1 NSVT lasting 11 beats. 16 nonsustained SVT, longest 17 beats. Rare supraventricular and ventricular ectopy. No sustained arrhythmias. No atrial fibrillation.  TTE, 12/08/2022  1. Left ventricular ejection fraction, by estimation, is 60 to 65%. The left ventricle has normal function. The left ventricle has no regional wall motion abnormalities. There is mild left ventricular hypertrophy. Left ventricular diastolic parameters are consistent with Grade I diastolic dysfunction (impaired relaxation).   2. Right ventricular systolic function is normal. The right ventricular size is normal. There is normal pulmonary artery systolic pressure.   3. The mitral valve is normal in structure. Mild mitral valve regurgitation. No evidence of mitral stenosis.   4. The aortic valve is normal in structure. Aortic valve regurgitation is not visualized. No aortic stenosis is present.   5. The inferior vena cava is normal in size with greater than 50% respiratory variability, suggesting right atrial pressure of 3 mmHg.   6. Agitated saline contrast bubble study was negative, with no evidence of any interatrial shunt.   ASSESSMENT AND PLAN:  #) cryptogenic stroke I spoke at length with the patient about monitoring for afib with an implantable loop recorder.  Risks,  benefits, and alteratives to implantable loop recorder were discussed with the patient today.   At this time, the patient is unsure about whether to proceed with ILR. She would like to first ensure that insurance approves it prior to scheduling implantation.  Will msg scheduler to start pre-cert process Patient to be called once pre-cert approved or denied to follow-up on ILR scheduling.  Additional ILR information provided  #) HTN Well-controlled in office. I recommended patient obtain BP cuff and take measurements 2-3/week and record measurements. Bring measurements to PCP office   Current medicines are reviewed at length with the patient today.   The patient does not have concerns regarding her medicines.  The following changes were made today:  none  Labs/ tests ordered today include:  Orders Placed This Encounter  Procedures   EKG 12-Lead     Disposition: Follow up with Dr. Lalla Brothers in  PRN for ILR implant, if patient wishes to proceed    Signed, Sherie Don, NP  01/23/23  2:45 PM  Electrophysiology [facility]

## 2023-01-23 ENCOUNTER — Ambulatory Visit: Payer: No Typology Code available for payment source | Attending: Cardiology | Admitting: Cardiology

## 2023-01-23 ENCOUNTER — Encounter: Payer: Self-pay | Admitting: Cardiology

## 2023-01-23 VITALS — BP 130/80 | HR 79 | Ht 63.0 in | Wt 228.0 lb

## 2023-01-23 DIAGNOSIS — I639 Cerebral infarction, unspecified: Secondary | ICD-10-CM

## 2023-01-23 DIAGNOSIS — I10 Essential (primary) hypertension: Secondary | ICD-10-CM | POA: Diagnosis not present

## 2023-01-23 NOTE — Patient Instructions (Signed)
Medication Instructions:  Your physician recommends that you continue on your current medications as directed. Please refer to the Current Medication list given to you today.  *If you need a refill on your cardiac medications before your next appointment, please call your pharmacy*   Lab Work: No labs ordered  If you have labs (blood work) drawn today and your tests are completely normal, you will receive your results only by: MyChart Message (if you have MyChart) OR A paper copy in the mail If you have any lab test that is abnormal or we need to change your treatment, we will call you to review the results.   Testing/Procedures: No testing ordered   Follow-Up: At [facility], you and your health needs are our priority.  As part of our continuing mission to provide you with exceptional heart care, we have created designated Provider Care Teams.  These Care Teams include your primary Cardiologist (physician) and Advanced Practice Providers (APPs -  Physician Assistants and Nurse Practitioners) who all work together to provide you with the care you need, when you need it.  We recommend signing up for the patient portal called "MyChart".  Sign up information is provided on this After Visit Summary.  MyChart is used to connect with patients for Virtual Visits (Telemedicine).  Patients are able to view lab/test results, encounter notes, upcoming appointments, etc.  Non-urgent messages can be sent to your provider as well.   To learn more about what you can do with MyChart, go to [email or web address].    Your next appointment:   As needed  Provider:   Steffanie Dunn, MD

## 2023-01-23 NOTE — Telephone Encounter (Signed)
Requested Prescriptions  Refused Prescriptions Disp Refills   losartan (COZAAR) 50 MG tablet [Pharmacy Med Name: LOSARTAN POTASSIUM 50 MG TAB] 90 tablet 0    Sig: TAKE 1 TABLET BY MOUTH EVERY DAY     Cardiovascular:  Angiotensin Receptor Blockers Passed - 01/22/2023 12:38 PM      Passed - Cr in normal range and within 180 days    Creat  Date Value Ref Range Status  12/04/2022 0.66 0.50 - 1.05 mg/dL Final   Creatinine, Ser  Date Value Ref Range Status  12/10/2022 0.73 0.44 - 1.00 mg/dL Final   Creatinine, Urine  Date Value Ref Range Status  11/07/2022 118 20 - 275 mg/dL Final         Passed - K in normal range and within 180 days    Potassium  Date Value Ref Range Status  12/10/2022 4.1 3.5 - 5.1 mmol/L Final         Passed - Patient is not pregnant      Passed - Last BP in normal range    BP Readings from Last 1 Encounters:  12/15/22 133/88         Passed - Valid encounter within last 6 months    Recent Outpatient Visits           1 month ago History of stroke   [facility] Berniece Salines, [name]   1 month ago Acute nonintractable headache, unspecified headache type   [facility] Berniece Salines, [name]   1 month ago Type 2 diabetes mellitus without complication, without long-term current use of insulin (HCC)   [facility] Mecum, [name], [name]   2 months ago Annual physical exam   [facility] Berniece Salines, [name]   8 months ago Acute cough   [facility] Berniece Salines, [name]       Future Appointments             In 1 month [name], [name], [name] [facility], [name]   In 9 months [name], [name], [name] [facility], [facility]

## 2023-01-25 ENCOUNTER — Telehealth: Payer: Self-pay | Admitting: Nurse Practitioner

## 2023-01-25 NOTE — Telephone Encounter (Signed)
Copied from CRM [id number]. Topic: General - Other >> Jan 25, 2023 12:26 PM Santiya F wrote: Reason for CRM: Robynn Pane with [employer] is calling in because they received FMLA paperwork on the pt and needs clarification on some things. She is requesting a nurse call her back today. She says she needs clarification on questions 7 and 5 regarding care and treatments. The FMLA is for pt's husband, but they need to verify pt's appointments and treatments because he took FMLA due to his wife's conditions. Please follow up with Robynn Pane.

## 2023-01-25 NOTE — Telephone Encounter (Signed)
Spoke to [employer] Antony Blackbird) and she stated they have all the paperwork and has 48 turn around before they make a decision.

## 2023-01-31 NOTE — Telephone Encounter (Signed)
Called Elizabeth Scott from [place] and told her per PCP if needed more clarification on care and treatments, they will need to fax over paperwork to Neurology office since pt is seeing specialist.

## 2023-02-12 ENCOUNTER — Other Ambulatory Visit: Payer: Self-pay | Admitting: Nurse Practitioner

## 2023-02-12 DIAGNOSIS — E119 Type 2 diabetes mellitus without complications: Secondary | ICD-10-CM

## 2023-02-12 NOTE — Telephone Encounter (Signed)
Requested Prescriptions  Pending Prescriptions Disp Refills   Semaglutide,0.25 or 0.5MG /DOS, (OZEMPIC, 0.25 OR 0.5 MG/DOSE,) 2 MG/3ML SOPN [Pharmacy Med Name: OZEMPIC 0.25-0.5 MG/DOSE PEN] 9 mL 0    Sig: INJECT 0.25MG  INTO THE SKIN ONE TIME PER WEEK     Endocrinology:  Diabetes - GLP-1 Receptor Agonists - semaglutide Failed - 02/12/2023  2:21 AM      Failed - HBA1C in normal range and within 180 days    Hgb A1c MFr Bld  Date Value Ref Range Status  11/07/2022 8.4 (H) <5.7 % of total Hgb Final    Comment:    For someone without known diabetes, a hemoglobin A1c value of 6.5% or greater indicates that they may have  diabetes and this should be confirmed with a follow-up  test. . For someone with known diabetes, a value <7% indicates  that their diabetes is well controlled and a value  greater than or equal to 7% indicates suboptimal  control. A1c targets should be individualized based on  duration of diabetes, age, comorbid conditions, and  other considerations. . Currently, no consensus exists regarding use of hemoglobin A1c for diagnosis of diabetes for children. .          Passed - Cr in normal range and within 360 days    Creat  Date Value Ref Range Status  12/04/2022 0.66 0.50 - 1.05 mg/dL Final   Creatinine, Ser  Date Value Ref Range Status  12/10/2022 0.73 0.44 - 1.00 mg/dL Final   Creatinine, Urine  Date Value Ref Range Status  11/07/2022 118 20 - 275 mg/dL Final         Passed - Valid encounter within last 6 months    Recent Outpatient Visits           1 month ago History of stroke   [name], [name]   2 months ago Acute nonintractable headache, unspecified headache type   [facility] Della Goo F, [name]   2 months ago Type 2 diabetes mellitus without complication, without long-term current use of insulin [facility])   [facility] Mecum, [name], [name]   3 months ago  Annual physical exam   [facility] Berniece Salines, [name]   9 months ago Acute cough   [facility] Berniece Salines, [name]       Future Appointments             In 3 weeks [name], [name], [name] [facility], [name]   In 1 month Lanier Prude, MD [facility] at [place]   In 9 months [name], [name], [name] [facility], [facility]

## 2023-02-14 ENCOUNTER — Other Ambulatory Visit: Payer: Self-pay | Admitting: Nurse Practitioner

## 2023-02-14 NOTE — Telephone Encounter (Signed)
Requested medication (s) are due for refill today: Yes  Requested medication (s) are on the active medication list: Yes  Last refill:  12/08/22  Future visit scheduled: Yes  Notes to clinic:  Last filled by Dr. Renae Gloss.    Requested Prescriptions  Pending Prescriptions Disp Refills   pantoprazole (PROTONIX) 40 MG tablet 30 tablet O    Sig: Take 1 tablet (40 mg total) by mouth daily.     Gastroenterology: Proton Pump Inhibitors Passed - 02/14/2023 10:11 AM      Passed - Valid encounter within last 12 months    Recent Outpatient Visits           2 months ago History of stroke   [facility] Della Goo F, [name]   2 months ago Acute nonintractable headache, unspecified headache type   [facility] Della Goo F, [name]   2 months ago Type 2 diabetes mellitus without complication, without long-term current use of insulin [facility])   [facility] Mecum, [name], [name]   3 months ago Annual physical exam   [facility] Berniece Salines, [name]   9 months ago Acute cough   [facility] Berniece Salines, [name]       Future Appointments             In 3 weeks [name], [name], [name] [facility], [name]   In 4 weeks Lanier Prude, MD [facility] at [place]   In 8 months [name], [name], [name] [facility], [facility]

## 2023-02-14 NOTE — Telephone Encounter (Signed)
Copied from CRM [id number]. Topic: General - Other >> Feb 14, 2023  9:49 AM Everette C wrote: Reason for CRM: Medication Refill - Medication: pantoprazole (PROTONIX) 40 MG tablet [[id number]]  Has the patient contacted their pharmacy? Yes.   (Agent: If no, request that the patient contact the pharmacy for the refill. If patient does not wish to contact the pharmacy document the reason why and proceed with request.) (Agent: If yes, when and what did the pharmacy advise?)  Preferred Pharmacy (with phone number or street name): [id number] - [place], [place] - [address] AT [facility] [address] [place] [address] Phone: [phone] Fax: [phone] Hours: Not open 24 hours   Has the patient been seen for an appointment in the last year OR does the patient have an upcoming appointment? Yes.    Agent: Please be advised that RX refills may take up to 3 business days. We ask that you follow-up with your pharmacy.

## 2023-02-14 NOTE — Telephone Encounter (Signed)
Requested medication (s) are due for refill today:yes  Requested medication (s) are on the active medication list: yes  Last refill:  12/08/22 #30   Future visit scheduled: yes  Notes to clinic:  last prescribed at hospital  discharge by Dr. Renae Gloss   Requested Prescriptions  Pending Prescriptions Disp Refills   pantoprazole (PROTONIX) 40 MG tablet 30 tablet O    Sig: Take 1 tablet (40 mg total) by mouth daily.     Gastroenterology: Proton Pump Inhibitors Passed - 02/14/2023 10:10 AM      Passed - Valid encounter within last 12 months    Recent Outpatient Visits           2 months ago History of stroke   [facility] Della Goo F, [name]   2 months ago Acute nonintractable headache, unspecified headache type   [facility] Della Goo F, [name]   2 months ago Type 2 diabetes mellitus without complication, without long-term current use of insulin [facility])   [facility] Mecum, [name], [name]   3 months ago Annual physical exam   [facility] Berniece Salines, [name]   9 months ago Acute cough   [facility] Berniece Salines, [name]       Future Appointments             In 3 weeks [name], [name], [name] [facility], [name]   In 4 weeks Lanier Prude, MD [facility] at [place]   In 8 months [name], [name], [name] [facility], [facility]

## 2023-02-15 ENCOUNTER — Other Ambulatory Visit: Payer: Self-pay

## 2023-02-15 MED ORDER — PANTOPRAZOLE SODIUM 40 MG PO TBEC: 40.0000 mg | DELAYED_RELEASE_TABLET | Freq: Every day | ORAL | 1 refills | Status: DC

## 2023-02-20 NOTE — Telephone Encounter (Signed)
Pt stated Monday or Tuesday of last week husbands job (possibly referring to [employer]) faxed forms to office as he requested FMLA for when she was out due to a stroke.    Pt is calling to f/u.  Please advise.

## 2023-02-21 NOTE — Telephone Encounter (Signed)
Patient notified paper work has been sent  and she stated that she was told if she had to be out again new paperwork is needed for him for intermittent leave

## 2023-03-09 ENCOUNTER — Ambulatory Visit: Payer: BC Managed Care – PPO | Admitting: Nurse Practitioner

## 2023-03-14 NOTE — Progress Notes (Unsigned)
Electrophysiology Office Follow up Visit Note:    Date:  03/15/2023   ID:  Elizabeth Scott, DOB 1961/06/28, MRN [id number]  PCP:  Berniece Salines, [name]  [facility] Cardiologist:  None  [facility] Electrophysiologist:  Lanier Prude, MD    Interval History:    Elizabeth Scott is a 62 y.o. female who presents for a follow up visit.   Last seen by Elizabeth Scott 01/23/2023 after she was admitted in May for cryptogenic stroke. She has a history of HTN, HLD, GERD, DM, COVID-19. There was no identifiable cause for her stroke. No history of AF. Heart monitoring after discharge showed no AF.   She is doing well.  She has done some reading at home without loop recorder monitoring.  No prior history of atrial fibrillation.  She tells me that she is still having a little bit of memory trouble but otherwise has had a good recovery after her stroke event.    Past medical, surgical, social and family history were reviewed.  ROS:   Please see the history of present illness.    All other systems reviewed and are negative.  EKGs/Labs/Other Studies Reviewed:    The following studies were reviewed today:  01/04/2023 zio personally reviewed HR 56 - 188, averate 83 bpm. 1 NSVT lasting 11 beats. 16 nonsustained SVT, longest 17 beats. Rare supraventricular and ventricular ectopy. No sustained arrhythmias. No atrial fibrillation.  12/08/2022 echo EF 60 RV normal Mild MR        Physical Exam:    VS:  BP (!) 142/86   Pulse 70   Ht 5\' 3"  (1.6 m)   Wt 232 lb (105.2 kg)   SpO2 98%   BMI 41.10 kg/m     Wt Readings from Last 3 Encounters:  03/15/23 232 lb (105.2 kg)  01/23/23 228 lb (103.4 kg)  12/15/22 231 lb 6.4 oz (105 kg)     GEN:  Well nourished, well developed in no acute distress CARDIAC: RRR, no murmurs, rubs, gallops RESPIRATORY:  Clear to auscultation without rales, wheezing or rhonchi       ASSESSMENT:    No diagnosis found. PLAN:    In order of  problems listed above:  #Cryptogenic Stroke Pathophysiology of cryptogenic stroke was discussed in detail during today's clinic appointment. I discussed the role of loop recorder monitoring in patients who have suffered a CVA/TIA. There has been no evidence of AF thus far in the patient's evaluation. Loop recorder monitors were discussed in detail including the implant procedure and its risks. I discussed the monthly monitoring costs associated with loop recorder monitoring. The patient would like to proceed with ILR implant.     Signed, Steffanie Dunn, MD, [facility], [facility] 03/15/2023 8:08 AM    Electrophysiology [facility]  -------------------------  SURGEON:  Lanier Prude, MD     PREPROCEDURE DIAGNOSIS:  Cryptogenic stroke    POSTPROCEDURE DIAGNOSIS: Cryptogenic stroke     PROCEDURES:   1. Implantable loop recorder implantation    INTRODUCTION:  Elizabeth Scott presents with a history of cryptogenic stroke The costs of loop recorder monitoring have been discussed with the patient.    DESCRIPTION OF PROCEDURE:  Informed written consent was obtained.  A preprocedural timeout was performed with the RN Wenda Low). The patient required no sedation for the procedure today.  Mapping over the patient's chest was performed to identify the area where electrograms were most prominent for ILR recording.  This area was found  to be the left parasternal region over the 4th intercostal space. The patients left chest was therefore prepped and draped in the usual sterile fashion. The skin overlying the left parasternal region was infiltrated with lidocaine for local analgesia.  A 0.5-cm incision was made over the left parasternal region over the 3rd intercostal space.  A subcutaneous ILR pocket was fashioned using a combination of sharp and blunt dissection.  A [employer] Reveal LINQ 2 implantable loop recorder was then placed into the pocket  R waves were very prominent and  measured >0.9mV.  Steri- Strips and a sterile dressing were then applied.  There were no early apparent complications.     CONCLUSIONS:   1. Successful implantation of a implantable loop recorder for Cryptogenic stroke  2. No early apparent complications.   [name] T. Lalla Brothers, MD, [facility], [facility] Cardiac Electrophysiology

## 2023-03-15 ENCOUNTER — Encounter: Payer: Self-pay | Admitting: Cardiology

## 2023-03-15 ENCOUNTER — Ambulatory Visit: Payer: No Typology Code available for payment source | Admitting: Cardiology

## 2023-03-15 ENCOUNTER — Ambulatory Visit: Payer: No Typology Code available for payment source | Attending: Cardiology | Admitting: Cardiology

## 2023-03-15 VITALS — BP 142/86 | HR 70 | Ht 63.0 in | Wt 232.0 lb

## 2023-03-15 DIAGNOSIS — I639 Cerebral infarction, unspecified: Secondary | ICD-10-CM

## 2023-03-15 NOTE — Patient Instructions (Signed)
Medication Instructions:  Your physician recommends that you continue on your current medications as directed. Please refer to the Current Medication list given to you today.  Labwork: None ordered.  Testing/Procedures: None ordered.  Follow-Up:  As needed with Dr. Lalla Brothers  Implantable Loop Recorder Placement, Care After This sheet gives you information about how to care for yourself after your procedure. Your health care provider may also give you more specific instructions. If you have problems or questions, contact your health care provider. What can I expect after the procedure? After the procedure, it is common to have: Soreness or discomfort near the incision. Some swelling or bruising near the incision.  Follow these instructions at home: Incision care  Monitor your cardiac device site for redness, swelling, and drainage. Call the device clinic at [phone] if you experience these symptoms or fever/chills.  Keep the large square bandage on your site for 24 hours and then you may remove it yourself. Keep the steri-strips underneath in place.   You may shower after 72 hours / 3 days from your procedure with the steri-strips in place. They will usually fall off on their own, or may be removed after 10 days. Pat dry.   Avoid lotions, ointments, or perfumes over your incision until it is well-healed.  Please do not submerge in water until your site is completely healed.   Your device is MRI compatible.   Remote monitoring is used to monitor your cardiac device from home. This monitoring is scheduled every month by our office. It allows [place] to keep an eye on the function of your device to ensure it is working properly.  If your wound site starts to bleed apply pressure.    For help with the monitor please call [employer] Monitor Support Specialist directly at [phone].    If you have any questions/concerns please call the device clinic at  [phone].  Activity  Return to your normal activities.  General instructions Follow instructions from your health care provider about how to manage your implantable loop recorder and transmit the information. Learn how to activate a recording if this is necessary for your type of device. You may go through a metal detection gate, and you may let someone hold a metal detector over your chest. Show your ID card if needed. Do not have an MRI unless you check with your health care provider first. Take over-the-counter and prescription medicines only as told by your health care provider. Keep all follow-up visits as told by your health care provider. This is important. Contact a health care provider if: You have redness, swelling, or pain around your incision. You have a fever. You have pain that is not relieved by your pain medicine. You have triggered your device because of fainting (syncope) or because of a heartbeat that feels like it is racing, slow, fluttering, or skipping (palpitations). Get help right away if you have: Chest pain. Difficulty breathing. Summary After the procedure, it is common to have soreness or discomfort near the incision. Change your dressing as told by your health care provider. Follow instructions from your health care provider about how to manage your implantable loop recorder and transmit the information. Keep all follow-up visits as told by your health care provider. This is important. This information is not intended to replace advice given to you by your health care provider. Make sure you discuss any questions you have with your health care provider. Document Released: 06/28/2015 Document Revised: 09/01/2017 Document Reviewed: 09/01/2017 Elsevier Patient  Education  2020 [employer]  

## 2023-03-16 ENCOUNTER — Encounter: Payer: Self-pay | Admitting: Nurse Practitioner

## 2023-03-16 ENCOUNTER — Ambulatory Visit (INDEPENDENT_AMBULATORY_CARE_PROVIDER_SITE_OTHER): Payer: No Typology Code available for payment source | Admitting: Nurse Practitioner

## 2023-03-16 ENCOUNTER — Other Ambulatory Visit: Payer: Self-pay

## 2023-03-16 VITALS — BP 124/82 | HR 89 | Temp 97.6°F | Resp 16 | Ht 63.0 in | Wt 229.2 lb

## 2023-03-16 DIAGNOSIS — Z8673 Personal history of transient ischemic attack (TIA), and cerebral infarction without residual deficits: Secondary | ICD-10-CM

## 2023-03-16 DIAGNOSIS — Z1211 Encounter for screening for malignant neoplasm of colon: Secondary | ICD-10-CM

## 2023-03-16 DIAGNOSIS — Z7985 Long-term (current) use of injectable non-insulin antidiabetic drugs: Secondary | ICD-10-CM

## 2023-03-16 DIAGNOSIS — E785 Hyperlipidemia, unspecified: Secondary | ICD-10-CM

## 2023-03-16 DIAGNOSIS — I10 Essential (primary) hypertension: Secondary | ICD-10-CM | POA: Diagnosis not present

## 2023-03-16 DIAGNOSIS — E1165 Type 2 diabetes mellitus with hyperglycemia: Secondary | ICD-10-CM

## 2023-03-16 DIAGNOSIS — Z7984 Long term (current) use of oral hypoglycemic drugs: Secondary | ICD-10-CM

## 2023-03-16 DIAGNOSIS — K219 Gastro-esophageal reflux disease without esophagitis: Secondary | ICD-10-CM

## 2023-03-16 DIAGNOSIS — Z6841 Body Mass Index (BMI) 40.0 and over, adult: Secondary | ICD-10-CM

## 2023-03-16 MED ORDER — GLIPIZIDE ER 10 MG PO TB24
10.0000 mg | ORAL_TABLET | Freq: Every day | ORAL | Status: AC
Start: 2023-03-16 — End: ?

## 2023-03-16 MED ORDER — PANTOPRAZOLE SODIUM 40 MG PO TBEC
40.0000 mg | DELAYED_RELEASE_TABLET | Freq: Every day | ORAL | 1 refills | Status: DC
Start: 2023-03-16 — End: 2023-08-03

## 2023-03-16 NOTE — Assessment & Plan Note (Signed)
Continue amlodipine 5 mg daily, losartan 50 mg daily

## 2023-03-16 NOTE — Progress Notes (Signed)
BP 124/82   Pulse 89   Temp 97.6 F (36.4 C) (Oral)   Resp 16   Ht 5\' 3"  (1.6 m)   Wt 229 lb 3.2 oz (104 kg)   SpO2 96%   BMI 40.60 kg/m    Subjective:    Patient ID: Elizabeth Scott, female    DOB: Oct 29, 1960, 62 y.o.   MRN: [id number]  HPI: Elizabeth Scott is a 62 y.o. female  Chief Complaint  Patient presents with   Diabetes   Hypertension   Gastroesophageal Reflux   Diabetes, Type 2:  -Last A1c 8.4 -Medications: glipizide 10 mg daily, actos 15 mg daily, januvia 25 mg daily and ozempic 0.25 mg weekly, increase 0.5 mg weekly -Patient is compliant with the above medications and reports no side effects.  -Checking BG at home: 120s -Eye exam: utd -Foot exam: utd -Microalbumin: utd -Statin: yes -Denies symptoms of hypoglycemia, polyuria, polydipsia, numbness extremities, foot ulcers/trauma.    Hypertension:  -Medications: amlodipine 5 mg daily, losartan 50 mg daily -Patient is compliant with above medications and reports no side effects. -Checking BP at home (average): 120-13/80s -Denies any SOB, CP, vision changes, LE edema or symptoms of hypotension  HLD:  -Medications: zetia 10 mg daily, atorvastatin 80 mg daily -Patient is compliant with above medications and reports no side effects.  -Last lipid panel:   Lipid Panel     Component Value Date/Time   CHOL 139 12/08/2022 0417   CHOL 151 08/30/2015 1054   TRIG 111 12/08/2022 0417   HDL 39 (L) 12/08/2022 0417   HDL 41 08/30/2015 1054   CHOLHDL 3.6 12/08/2022 0417   VLDL 22 12/08/2022 0417   LDLCALC 78 12/08/2022 0417   LDLCALC 157 (H) 11/07/2022 1545   LABVLDL 23 08/30/2015 1054    History of stroke:she was send to the er on 12/07/2022 with c/o dizziness, headache and changes in vision.  After stat ct scan. She was positive for stroke and admitted. She was discharged on 12/08/2022. She has seen cardiology and neurology.  She is currently on asa 81 mg daily, plavix 75 mg daily and atorvastatin 80  mg daily.  Cardiology placed an implantable loop recorder yesterday.  Obesity:  Current weight : 229 lbs BMI: 40.60 Previous weight:231 lbs Treatment Tried: lifestyle modification on ozempic Comorbidities: cva, htn, hld, dm   Relevant past medical, surgical, family and social history reviewed and updated as indicated. Interim medical history since our last visit reviewed. Allergies and medications reviewed and updated.  Review of Systems  Constitutional: Negative for fever or weight change.  Respiratory: Negative for cough and shortness of breath.   Cardiovascular: Negative for chest pain or palpitations.  Gastrointestinal: Negative for abdominal pain, no bowel changes.  Musculoskeletal: Negative for gait problem or joint swelling.  Skin: Negative for rash.  Neurological: Negative for dizziness or headache.  No other specific complaints in a complete review of systems (except as listed in HPI above).      Objective:    BP 124/82   Pulse 89   Temp 97.6 F (36.4 C) (Oral)   Resp 16   Ht 5\' 3"  (1.6 m)   Wt 229 lb 3.2 oz (104 kg)   SpO2 96%   BMI 40.60 kg/m   Wt Readings from Last 3 Encounters:  03/16/23 229 lb 3.2 oz (104 kg)  03/15/23 232 lb (105.2 kg)  01/23/23 228 lb (103.4 kg)    Physical Exam  Constitutional: Patient appears well-developed and well-nourished.  Obese  No distress.  HEENT: head atraumatic, normocephalic, pupils equal and reactive to light, neck supple, throat within normal limits Cardiovascular: Normal rate, regular rhythm and normal heart sounds.  No murmur heard. No BLE edema. Pulmonary/Chest: Effort normal and breath sounds normal. No respiratory distress. Abdominal: Soft.  There is no tenderness. Psychiatric: Patient has a normal mood and affect. behavior is normal. Judgment and thought content normal.  Results for orders placed or performed in visit on 12/19/22  HM DIABETES EYE EXAM  Result Value Ref Range   HM Diabetic Eye Exam No Retinopathy  No Retinopathy      Assessment & Plan:   Problem List Items Addressed This Visit       Cardiovascular and Mediastinum   Essential hypertension, benign (Chronic)    Continue : amlodipine 5 mg daily, losartan 50 mg daily      Relevant Orders   CBC with Differential/Platelet   COMPLETE METABOLIC PANEL WITH GFR     Digestive   Gastroesophageal reflux disease without esophagitis    Continue protonix, refill sent, stable      Relevant Medications   pantoprazole (PROTONIX) 40 MG tablet     Endocrine   Uncontrolled type 2 diabetes mellitus with hyperglycemia, without long-term current use of insulin (HCC) - Primary (Chronic)    Increase ozempic 0.5 mg weekly, continue glipizide 10 mg daily, actos 15 mg daily, januvia 25 mg daily       Relevant Medications   glipiZIDE (GLUCOTROL XL) 10 MG 24 hr tablet   Other Relevant Orders   COMPLETE METABOLIC PANEL WITH GFR   Hemoglobin A1c     Other   Hyperlipidemia    Continue zetia 10 mg daily, atorvastatin 80 mg daily      Class 2 severe obesity with serious comorbidity and body mass index (BMI) of 37.0 to 37.9 in adult [facility])    Working on lifestyle modification, on ozempic      Relevant Medications   glipiZIDE (GLUCOTROL XL) 10 MG 24 hr tablet   History of stroke    Doing well, recently had implantable loop recorder placed.       Other Visit Diagnoses     Screening for colon cancer       Relevant Orders   Ambulatory referral to Gastroenterology        Follow up plan: Return in about 3 months (around 06/16/2023) for follow up.

## 2023-03-16 NOTE — Assessment & Plan Note (Signed)
Working on lifestyle modification, on ozempic

## 2023-03-16 NOTE — Assessment & Plan Note (Signed)
Continue zetia 10 mg daily, atorvastatin 80 mg daily

## 2023-03-16 NOTE — Assessment & Plan Note (Signed)
Continue protonix, refill sent, stable

## 2023-03-16 NOTE — Assessment & Plan Note (Signed)
Doing well, recently had implantable loop recorder placed.

## 2023-03-16 NOTE — Assessment & Plan Note (Signed)
Increase ozempic 0.5 mg weekly, continue glipizide 10 mg daily, actos 15 mg daily, januvia 25 mg daily

## 2023-03-17 LAB — COMPLETE METABOLIC PANEL WITH GFR
AG Ratio: 1.6 (calc) (ref 1.0–2.5)
ALT: 12 U/L (ref 6–29)
AST: 14 U/L (ref 10–35)
Albumin: 3.9 g/dL (ref 3.6–5.1)
Alkaline phosphatase (APISO): 64 U/L (ref 37–153)
BUN: 16 mg/dL (ref 7–25)
CO2: 27 mmol/L (ref 20–32)
Calcium: 9.1 mg/dL (ref 8.6–10.4)
Chloride: 106 mmol/L (ref 98–110)
Creat: 0.67 mg/dL (ref 0.50–1.05)
Globulin: 2.4 g/dL (ref 1.9–3.7)
Glucose, Bld: 108 mg/dL — ABNORMAL HIGH (ref 65–99)
Potassium: 4.1 mmol/L (ref 3.5–5.3)
Sodium: 139 mmol/L (ref 135–146)
Total Bilirubin: 0.8 mg/dL (ref 0.2–1.2)
Total Protein: 6.3 g/dL (ref 6.1–8.1)
eGFR: 99 mL/min/{1.73_m2} (ref >=60)

## 2023-03-17 LAB — CBC WITH DIFFERENTIAL/PLATELET
Absolute Monocytes: 536 {cells}/uL (ref 200–950)
Basophils Absolute: 48 {cells}/uL (ref 0–200)
Basophils Relative: 0.6 %
Eosinophils Absolute: 80 {cells}/uL (ref 15–500)
Eosinophils Relative: 1 %
HCT: 37.1 % (ref 35.0–45.0)
Hemoglobin: 12 g/dL (ref 11.7–15.5)
Lymphs Abs: 1256 {cells}/uL (ref 850–3900)
MCH: 24.4 pg — ABNORMAL LOW (ref 27.0–33.0)
MCHC: 32.3 g/dL (ref 32.0–36.0)
MCV: 75.6 fL — ABNORMAL LOW (ref 80.0–100.0)
MPV: 12 fL (ref 7.5–12.5)
Monocytes Relative: 6.7 %
Neutro Abs: 6080 {cells}/uL (ref 1500–7800)
Neutrophils Relative %: 76 %
Platelets: 251 10*3/uL (ref 140–400)
RBC: 4.91 10*6/uL (ref 3.80–5.10)
RDW: 17.1 % — ABNORMAL HIGH (ref 11.0–15.0)
Total Lymphocyte: 15.7 %
WBC: 8 10*3/uL (ref 3.8–10.8)

## 2023-03-17 LAB — HEMOGLOBIN A1C
Hgb A1c MFr Bld: 6.5 %{Hb} — ABNORMAL HIGH (ref <5.7)
Mean Plasma Glucose: 140 mg/dL
eAG (mmol/L): 7.7 mmol/L

## 2023-03-23 ENCOUNTER — Telehealth: Payer: Self-pay

## 2023-03-23 ENCOUNTER — Telehealth: Payer: Self-pay | Admitting: *Deleted

## 2023-03-23 ENCOUNTER — Other Ambulatory Visit: Payer: Self-pay | Admitting: *Deleted

## 2023-03-23 DIAGNOSIS — Z1211 Encounter for screening for malignant neoplasm of colon: Secondary | ICD-10-CM

## 2023-03-23 MED ORDER — NA SULFATE-K SULFATE-MG SULF 17.5-3.13-1.6 GM/177ML PO SOLN: 1.0000 | Freq: Once | ORAL | 0 refills | Status: AC

## 2023-03-23 NOTE — Telephone Encounter (Signed)
Pt is scheduled for tele on 10/07 at 2:20 pm. Med rec and consent done    Patient Consent for Virtual Visit        Elizabeth Scott has provided verbal consent on 03/23/2023 for a virtual visit (video or telephone).   CONSENT FOR VIRTUAL VISIT FOR:  Elizabeth Bartolomucci Scott  By participating in this virtual visit I agree to the following:  I hereby voluntarily request, consent and authorize [facility] and its employed or contracted physicians, physician assistants, nurse practitioners or other licensed health care professionals (the Practitioner), to provide me with telemedicine health care services (the "Services") as deemed necessary by the treating Practitioner. I acknowledge and consent to receive the Services by the Practitioner via telemedicine. I understand that the telemedicine visit will involve communicating with the Practitioner through live audiovisual communication technology and the disclosure of certain medical information by electronic transmission. I acknowledge that I have been given the opportunity to request an in-person assessment or other available alternative prior to the telemedicine visit and am voluntarily participating in the telemedicine visit.  I understand that I have the right to withhold or withdraw my consent to the use of telemedicine in the course of my care at any time, without affecting my right to future care or treatment, and that the Practitioner or I may terminate the telemedicine visit at any time. I understand that I have the right to inspect all information obtained and/or recorded in the course of the telemedicine visit and may receive copies of available information for a reasonable fee.  I understand that some of the potential risks of receiving the Services via telemedicine include:  Delay or interruption in medical evaluation due to technological equipment failure or disruption; Information transmitted may not be sufficient (e.g. poor  resolution of images) to allow for appropriate medical decision making by the Practitioner; and/or  In rare instances, security protocols could fail, causing a breach of personal health information.  Furthermore, I acknowledge that it is my responsibility to provide information about my medical history, conditions and care that is complete and accurate to the best of my ability. I acknowledge that Practitioner's advice, recommendations, and/or decision may be based on factors not within their control, such as incomplete or inaccurate data provided by me or distortions of diagnostic images or specimens that may result from electronic transmissions. I understand that the practice of medicine is not an exact science and that Practitioner makes no warranties or guarantees regarding treatment outcomes. I acknowledge that a copy of this consent can be made available to me via my patient portal [facility] MyChart), or I can request a printed copy by calling the office of [facility].    I understand that my insurance will be billed for this visit.   I have read or had this consent read to me. I understand the contents of this consent, which adequately explains the benefits and risks of the Services being provided via telemedicine.  I have been provided ample opportunity to ask questions regarding this consent and the Services and have had my questions answered to my satisfaction. I give my informed consent for the services to be provided through the use of telemedicine in my medical care

## 2023-03-23 NOTE — Telephone Encounter (Signed)
   Name: Elizabeth Scott  DOB: 1961/01/01  MRN: [id number]  Primary Cardiologist: None   Preoperative team, please contact this patient and set up a phone call appointment for further preoperative risk assessment. Please obtain consent and complete medication review. Thank you for your help.  I confirm that guidance regarding antiplatelet and oral anticoagulation therapy has been completed and, if necessary, noted below.  Guidance for holding aspirin and Plavix should come from prescribing provider.  Currently not managed by cardiology.   Napoleon Form, Leodis Rains, NP 03/23/2023, 12:38 PM  [facility]

## 2023-03-23 NOTE — Telephone Encounter (Signed)
Pt is scheduled for tele on 10/07 at 2:20 pm. Med rec and consent done

## 2023-03-23 NOTE — Telephone Encounter (Signed)
Gastroenterology Pre-Procedure Review  Request Date: 05/22/2023 Requesting Physician: Dr. Servando Snare  PATIENT REVIEW QUESTIONS: The patient responded to the following health history questions as indicated:    1. Are you having any GI issues? no 2. Do you have a personal history of Polyps? yes (colonoscopy was around 2017) 3. Do you have a family history of Colon Cancer or Polyps? no 4. Diabetes Mellitus? yes (taking glipizide, Actos, Januvia, and Ozempic) 5. Joint replacements in the past 12 months?no 6. Major health problems in the past 3 months?no 7. Any artificial heart valves, MVP, or defibrillator?yes (PCA stroke)    MEDICATIONS & ALLERGIES:    Patient reports the following regarding taking any anticoagulation/antiplatelet therapy:   Plavix, Coumadin, Eliquis, Xarelto, Lovenox, Pradaxa, Brilinta, or Effient? yes (Plavix) Aspirin? yes (81 mg)  Patient confirms/reports the following medications:  Current Outpatient Medications  Medication Sig Dispense Refill   albuterol (PROVENTIL HFA;VENTOLIN HFA) 108 (90 BASE) MCG/ACT inhaler Inhale 2 puffs into the lungs every 2 (two) hours as needed for wheezing or shortness of breath.     amLODipine (NORVASC) 5 MG tablet Take 1 tablet (5 mg total) by mouth daily. 90 tablet 3   aspirin EC 81 MG tablet Take 81 mg by mouth daily.     atorvastatin (LIPITOR) 80 MG tablet TAKE 1 TABLET BY MOUTH EVERY DAY 90 tablet 2   clopidogrel (PLAVIX) 75 MG tablet Take 1 tablet (75 mg total) by mouth every evening. 21 tablet 0   ezetimibe (ZETIA) 10 MG tablet Take 1 tablet (10 mg total) by mouth daily. 90 tablet 3   glipiZIDE (GLUCOTROL XL) 10 MG 24 hr tablet Take 1 tablet (10 mg total) by mouth daily with breakfast.     losartan (COZAAR) 50 MG tablet TAKE 1 TABLET BY MOUTH EVERY DAY 90 tablet 0   methocarbamol (ROBAXIN) 500 MG tablet Take 1 tablet (500 mg total) by mouth 2 (two) times daily as needed for muscle spasms. 20 tablet 0   pantoprazole (PROTONIX) 40 MG  tablet Take 1 tablet (40 mg total) by mouth daily. 90 tablet 1   pioglitazone (ACTOS) 15 MG tablet TAKE 1 TABLET (15 MG TOTAL) BY MOUTH DAILY. 90 tablet 3   Semaglutide,0.25 or 0.5MG /DOS, (OZEMPIC, 0.25 OR 0.5 MG/DOSE,) 2 MG/3ML SOPN INJECT 0.25MG  INTO THE SKIN ONE TIME PER WEEK 9 mL 0   sitaGLIPtin (JANUVIA) 25 MG tablet Take 25 mg by mouth daily.     No current facility-administered medications for this visit.    Patient confirms/reports the following allergies:  Allergies  Allergen Reactions   Metformin And Related Other (See Comments)    Rash, Thrush    Other     walnuts    No orders of the defined types were placed in this encounter.   AUTHORIZATION INFORMATION Primary Insurance: 1D#: Group #:  Secondary Insurance: 1D#: Group #:  SCHEDULE INFORMATION: Date: 05/22/2023 Time: Location:  [facility]

## 2023-03-23 NOTE — Telephone Encounter (Signed)
   Pre-operative Risk Assessment    Patient Name: Elizabeth Scott  DOB: 15-Nov-1960 MRN: [id number]   Last OV: 03/15/23 with Dr. Lalla Brothers No follow up appt   Request for Surgical Clearance    Procedure:   Colonoscopy  Date of Surgery:  Clearance 05/22/23                                 Surgeon:  Not Stated Surgeon's Group or Practice Name:  [facility] Phone number:  [phone] Fax number:  [phone]   Type of Clearance Requested:   - Medical  - Pharmacy:  Hold Aspirin and Clopidogrel (Plavix) pt will need instructions on when/if to hold   Type of Anesthesia:  General    Additional requests/questions:    Wynetta Fines   03/23/2023, 12:31 PM

## 2023-03-27 ENCOUNTER — Other Ambulatory Visit: Payer: Self-pay | Admitting: Nurse Practitioner

## 2023-03-27 DIAGNOSIS — M5441 Lumbago with sciatica, right side: Secondary | ICD-10-CM

## 2023-03-28 NOTE — Telephone Encounter (Signed)
Requested medication (s) are due for refill today:   Provider to reveiw  Requested medication (s) are on the active medication list:   Yes  Future visit scheduled:   Yes 11/19 with Raynelle Fanning   Last ordered: 10/19/2021 #20, 0 refills  Non delegated refill    Requested Prescriptions  Pending Prescriptions Disp Refills   methocarbamol (ROBAXIN) 500 MG tablet [Pharmacy Med Name: METHOCARBAMOL 500 MG TABLET] 20 tablet 0    Sig: Take 1 tablet (500 mg total) by mouth 2 (two) times daily as needed for muscle spasms.     Not Delegated - Analgesics:  Muscle Relaxants Failed - 03/27/2023 10:33 AM      Failed - This refill cannot be delegated      Passed - Valid encounter within last 6 months    Recent Outpatient Visits           1 week ago Uncontrolled type 2 diabetes mellitus with hyperglycemia, without long-term current use of insulin [facility])   [facility] Berniece Salines, [name]   3 months ago History of stroke   [facility] Della Goo F, [name]   3 months ago Acute nonintractable headache, unspecified headache type   [facility] Della Goo F, [name]   3 months ago Type 2 diabetes mellitus without complication, without long-term current use of insulin [facility])   [facility] Mecum, [name], [name]   4 months ago Annual physical exam   [facility] Berniece Salines, [name]       Future Appointments             In 2 months [name], [name], [name] [name]   In 7 months [name], [name], [name] [facility], [facility]

## 2023-04-11 ENCOUNTER — Telehealth: Payer: Self-pay | Admitting: *Deleted

## 2023-04-11 NOTE — Telephone Encounter (Signed)
Received procedure clearance from Dr Theora Master from [facility]  Patient is cleared to have procedure and should stop taking aspirin and Plavix for 7 days prior and restart immediatly after procedure if safe from GI provider perspective.  Call patient on Monday, 05/14/2023 to let patient know to stop on Tuesday, 05/15/2023.

## 2023-04-19 ENCOUNTER — Ambulatory Visit: Payer: Self-pay

## 2023-04-19 ENCOUNTER — Telehealth: Payer: No Typology Code available for payment source | Admitting: Physician Assistant

## 2023-04-19 DIAGNOSIS — J069 Acute upper respiratory infection, unspecified: Secondary | ICD-10-CM | POA: Diagnosis not present

## 2023-04-19 MED ORDER — FLUTICASONE PROPIONATE 50 MCG/ACT NA SUSP
2.0000 | Freq: Every day | NASAL | 0 refills | Status: AC
Start: 2023-04-19 — End: ?

## 2023-04-19 MED ORDER — PROMETHAZINE-DM 6.25-15 MG/5ML PO SYRP
5.0000 mL | ORAL_SOLUTION | Freq: Four times a day (QID) | ORAL | 0 refills | Status: DC | PRN
Start: 2023-04-19 — End: 2023-06-22

## 2023-04-19 MED ORDER — BENZONATATE 100 MG PO CAPS
100.0000 mg | ORAL_CAPSULE | Freq: Three times a day (TID) | ORAL | 0 refills | Status: DC | PRN
Start: 2023-04-19 — End: 2023-06-22

## 2023-04-19 MED ORDER — ALBUTEROL SULFATE HFA 108 (90 BASE) MCG/ACT IN AERS
1.0000 | INHALATION_SPRAY | Freq: Four times a day (QID) | RESPIRATORY_TRACT | 0 refills | Status: AC | PRN
Start: 2023-04-19 — End: ?

## 2023-04-19 NOTE — Patient Instructions (Signed)
Elizabeth Scott, thank you for joining Elizabeth Loveless, [name] for today's virtual visit.  While this provider is not your primary care provider (PCP), if your PCP is located in our provider database this encounter information will be shared with them immediately following your visit.   A [facility] MyChart account gives you access to today's visit and all your visits, tests, and labs performed at [facility] " click here if you don't have a  MyChart account or go to [email or web address]  Consent: (Patient) Elizabeth Scott provided verbal consent for this virtual visit at the beginning of the encounter.  Current Medications:  Current Outpatient Medications:    albuterol (VENTOLIN HFA) 108 (90 Base) MCG/ACT inhaler, Inhale 1-2 puffs into the lungs every 6 (six) hours as needed., Disp: 8 g, Rfl: 0   benzonatate (TESSALON) 100 MG capsule, Take 1 capsule (100 mg total) by mouth 3 (three) times daily as needed., Disp: 30 capsule, Rfl: 0   fluticasone (FLONASE) 50 MCG/ACT nasal spray, Place 2 sprays into both nostrils daily., Disp: 16 g, Rfl: 0   promethazine-dextromethorphan (PROMETHAZINE-DM) 6.25-15 MG/5ML syrup, Take 5 mLs by mouth 4 (four) times daily as needed., Disp: 118 mL, Rfl: 0   amLODipine (NORVASC) 5 MG tablet, Take 1 tablet (5 mg total) by mouth daily., Disp: 90 tablet, Rfl: 3   aspirin EC 81 MG tablet, Take 81 mg by mouth daily., Disp: , Rfl:    atorvastatin (LIPITOR) 80 MG tablet, TAKE 1 TABLET BY MOUTH EVERY DAY, Disp: 90 tablet, Rfl: 2   clopidogrel (PLAVIX) 75 MG tablet, Take 1 tablet (75 mg total) by mouth every evening., Disp: 21 tablet, Rfl: 0   ezetimibe (ZETIA) 10 MG tablet, Take 1 tablet (10 mg total) by mouth daily., Disp: 90 tablet, Rfl: 3   glipiZIDE (GLUCOTROL XL) 10 MG 24 hr tablet, Take 1 tablet (10 mg total) by mouth daily with breakfast., Disp: , Rfl:    losartan (COZAAR) 50 MG tablet, TAKE 1 TABLET BY MOUTH EVERY DAY, Disp:  90 tablet, Rfl: 0   methocarbamol (ROBAXIN) 500 MG tablet, TAKE 1 TABLET (500 MG TOTAL) BY MOUTH 2 (TWO) TIMES DAILY AS NEEDED FOR MUSCLE SPASMS., Disp: 20 tablet, Rfl: 0   pantoprazole (PROTONIX) 40 MG tablet, Take 1 tablet (40 mg total) by mouth daily., Disp: 90 tablet, Rfl: 1   pioglitazone (ACTOS) 15 MG tablet, TAKE 1 TABLET (15 MG TOTAL) BY MOUTH DAILY., Disp: 90 tablet, Rfl: 3   Semaglutide,0.25 or 0.5MG /DOS, (OZEMPIC, 0.25 OR 0.5 MG/DOSE,) 2 MG/3ML SOPN, INJECT 0.25MG  INTO THE SKIN ONE TIME PER WEEK, Disp: 9 mL, Rfl: 0   sitaGLIPtin (JANUVIA) 25 MG tablet, Take 25 mg by mouth daily., Disp: , Rfl:    Medications ordered in this encounter:  Meds ordered this encounter  Medications   albuterol (VENTOLIN HFA) 108 (90 Base) MCG/ACT inhaler    Sig: Inhale 1-2 puffs into the lungs every 6 (six) hours as needed.    Dispense:  8 g    Refill:  0    Order Specific Question:   Supervising Provider    Answer:   Merrilee Jansky [id number]   promethazine-dextromethorphan (PROMETHAZINE-DM) 6.25-15 MG/5ML syrup    Sig: Take 5 mLs by mouth 4 (four) times daily as needed.    Dispense:  118 mL    Refill:  0    Order Specific Question:   Supervising Provider    Answer:   Merrilee Jansky [id number]  benzonatate (TESSALON) 100 MG capsule    Sig: Take 1 capsule (100 mg total) by mouth 3 (three) times daily as needed.    Dispense:  30 capsule    Refill:  0    Order Specific Question:   Supervising Provider    Answer:   Merrilee Jansky [[id number]]   fluticasone (FLONASE) 50 MCG/ACT nasal spray    Sig: Place 2 sprays into both nostrils daily.    Dispense:  16 g    Refill:  0    Order Specific Question:   Supervising Provider    Answer:   Merrilee Jansky [id number]     *If you need refills on other medications prior to your next appointment, please contact your pharmacy*  Follow-Up: Call back or seek an in-person evaluation if the symptoms worsen or if the condition fails to improve as  anticipated.  [facility] [phone]  Other Instructions Viral Respiratory Infection A respiratory infection is an illness that affects part of the respiratory system, such as the lungs, nose, or throat. A respiratory infection that is caused by a virus is called a viral respiratory infection. Common types of viral respiratory infections include: A cold. The flu (influenza). A respiratory syncytial virus (RSV) infection. What are the causes? This condition is caused by a virus. The virus may spread through contact with droplets or direct contact with infected people or their mucus or secretions. The virus may spread from person to person (is contagious). What are the signs or symptoms? Symptoms of this condition include: A stuffy or runny nose. A sore throat or cough. Shortness of breath or difficulty breathing. Yellow or green mucus (sputum). Other symptoms may include: A fever. Sweating or chills. Fatigue. Achy muscles. A headache. How is this diagnosed? This condition may be diagnosed based on: Your symptoms. A physical exam. Testing of secretions from the nose or throat. Chest X-ray. How is this treated? This condition may be treated with medicines, such as: Antiviral medicine. This may shorten the length of time a person has symptoms. Expectorants. These make it easier to cough up mucus. Decongestant nasal sprays. Acetaminophen or NSAIDs, such as ibuprofen, to relieve fever and pain. Antibiotic medicines are not prescribed for viral infections.This is because antibiotics are designed to kill bacteria. They do not kill viruses. Follow these instructions at home: Managing pain and congestion Take over-the-counter and prescription medicines only as told by your health care provider. If you have a sore throat, gargle with a mixture of salt and water 3-4 times a day or as needed. To make salt water, completely dissolve -1 tsp (3-6 g) of salt in 1 cup (237  mL) of warm water. Use nose drops made from salt water to ease congestion and soften raw skin around your nose. Take 2 tsp (10 mL) of honey at bedtime to lessen coughing at night. Do not give honey to children who are younger than 1 year. Drink enough fluid to keep your urine pale yellow. This helps prevent dehydration and helps loosen up mucus. General instructions  Rest as much as possible. Do not drink alcohol. Do not use any products that contain nicotine or tobacco. These products include cigarettes, chewing tobacco, and vaping devices, such as e-cigarettes. If you need help quitting, ask your health care provider. Keep all follow-up visits. This is important. How is this prevented?     Get an annual flu shot. You may get the flu shot in late summer, fall, or winter.  Ask your health care provider when you should get your flu shot. Avoid spreading your infection to other people. If you are sick: Wash your hands with soap and water often, especially after you cough or sneeze. Wash for at least 20 seconds. If soap and water are not available, use alcohol-based hand sanitizer. Cover your mouth when you cough. Cover your nose and mouth when you sneeze. Do not share cups or eating utensils. Clean commonly used objects often. Clean commonly touched surfaces. Stay home from work or school as told by your health care provider. Avoid contact with people who are sick during cold and flu season. This is generally fall and winter. Contact a health care provider if: Your symptoms last for 10 days or longer. Your symptoms get worse over time. You have severe sinus pain in your face or forehead. The glands in your jaw or neck become very swollen. You have shortness of breath. Get help right away if you: Feel pain or pressure in your chest. Have trouble breathing. Faint or feel like you will faint. Have severe and persistent vomiting. Feel confused or disoriented. These symptoms may represent  a serious problem that is an emergency. Do not wait to see if the symptoms will go away. Get medical help right away. Call your local emergency services (911 in the [place] Do not drive yourself to the hospital. Summary A respiratory infection is an illness that affects part of the respiratory system, such as the lungs, nose, or throat. A respiratory infection that is caused by a virus is called a viral respiratory infection. Common types of viral respiratory infections include a cold, influenza, and respiratory syncytial virus (RSV) infection. Symptoms of this condition include a stuffy or runny nose, cough, fatigue, achy muscles, sore throat, and fevers or chills. Antibiotic medicines are not prescribed for viral infections. This is because antibiotics are designed to kill bacteria. They are not effective against viruses. This information is not intended to replace advice given to you by your health care provider. Make sure you discuss any questions you have with your health care provider. Document Revised: 10/21/2020 Document Reviewed: 10/21/2020 Elsevier Patient Education  2024 Elsevier Inc.    If you have been instructed to have an in-person evaluation today at a local Urgent Care facility, please use the link below. It will take you to a list of all of our available [facility], including address, phone number and hours of operation. Please do not delay care.  [facility]  If you or a family member do not have a primary care provider, use the link below to schedule a visit and establish care. When you choose a [facility] primary care physician or advanced practice provider, you gain a long-term partner in health. Find a Primary Care Provider  Learn more about [facility]'s in-office and virtual care options: [facility] - Get Care Now

## 2023-04-19 NOTE — Progress Notes (Signed)
Virtual Visit Consent   Elizabeth Scott, you are scheduled for a virtual visit with a [facility] provider today. Just as with appointments in the office, your consent must be obtained to participate. Your consent will be active for this visit and any virtual visit you may have with one of our providers in the next 365 days. If you have a MyChart account, a copy of this consent can be sent to you electronically.  As this is a virtual visit, video technology does not allow for your provider to perform a traditional examination. This may limit your provider's ability to fully assess your condition. If your provider identifies any concerns that need to be evaluated in person or the need to arrange testing (such as labs, EKG, etc.), we will make arrangements to do so. Although advances in technology are sophisticated, we cannot ensure that it will always work on either your end or our end. If the connection with a video visit is poor, the visit may have to be switched to a telephone visit. With either a video or telephone visit, we are not always able to ensure that we have a secure connection.  By engaging in this virtual visit, you consent to the provision of healthcare and authorize for your insurance to be billed (if applicable) for the services provided during this visit. Depending on your insurance coverage, you may receive a charge related to this service.  I need to obtain your verbal consent now. Are you willing to proceed with your visit today? Elizabeth Scott has provided verbal consent on 04/19/2023 for a virtual visit (video or telephone). Margaretann Loveless, [name]  Date: 04/19/2023 9:00 AM  Virtual Visit via Video Note   I, Margaretann Loveless, connected with  Elizabeth Scott  ([id number], 09-20-1960) on 04/19/23 at  8:45 AM EDT by a video-enabled telemedicine application and verified that I am speaking with the correct person using two identifiers.  Location: Patient:  Virtual Visit Location Patient: Home Provider: Virtual Visit Location Provider: Home Office   I discussed the limitations of evaluation and management by telemedicine and the availability of in person appointments. The patient expressed understanding and agreed to proceed.    History of Present Illness: Elizabeth Scott is a 62 y.o. who identifies as a female who was assigned female at birth, and is being seen today for URI symptoms.  HPI: URI  This is a new problem. The current episode started in the past 7 days (Started Sunday, 04/15/23). The problem has been gradually worsening. There has been no fever. Associated symptoms include congestion, coughing (mixed), headaches, rhinorrhea, sinus pain, a sore throat (initially, now only sore with coughing) and wheezing. Pertinent negatives include no chest pain, diarrhea, ear pain, nausea, plugged ear sensation or vomiting. Associated symptoms comments: General malaise, hot flashes. Treatments tried: mucinex, tylenol, cough drops. The treatment provided no relief.   Covid 19 at home testing is negative.   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   Preventative health care 08/30/2015   Type 2 diabetes mellitus without complication, without  long-term current use of insulin (HCC) 04/27/2015   Hyperlipidemia 04/27/2015   Essential hypertension, benign 04/27/2015   Microcytosis 04/27/2015   Class 2 severe obesity with serious comorbidity and body mass index (BMI) of 37.0 to 37.9 in adult (HCC) 04/27/2015   Gastroesophageal  reflux disease without esophagitis 01/08/2015    Allergies:  Allergies  Allergen Reactions   Metformin And Related Other (See Comments)    Rash, Thrush    Other     walnuts   Medications:  Current Outpatient Medications:    albuterol (VENTOLIN HFA) 108 (90 Base) MCG/ACT inhaler, Inhale 1-2 puffs into the lungs every 6 (six) hours as needed., Disp: 8 g, Rfl: 0   benzonatate (TESSALON) 100 MG capsule, Take 1 capsule (100 mg total) by mouth 3 (three) times daily as needed., Disp: 30 capsule, Rfl: 0   fluticasone (FLONASE) 50 MCG/ACT nasal spray, Place 2 sprays into both nostrils daily., Disp: 16 g, Rfl: 0   promethazine-dextromethorphan (PROMETHAZINE-DM) 6.25-15 MG/5ML syrup, Take 5 mLs by mouth 4 (four) times daily as needed., Disp: 118 mL, Rfl: 0   amLODipine (NORVASC) 5 MG tablet, Take 1 tablet (5 mg total) by mouth daily., Disp: 90 tablet, Rfl: 3   aspirin EC 81 MG tablet, Take 81 mg by mouth daily., Disp: , Rfl:    atorvastatin (LIPITOR) 80 MG tablet, TAKE 1 TABLET BY MOUTH EVERY DAY, Disp: 90 tablet, Rfl: 2   clopidogrel (PLAVIX) 75 MG tablet, Take 1 tablet (75 mg total) by mouth every evening., Disp: 21 tablet, Rfl: 0   ezetimibe (ZETIA) 10 MG tablet, Take 1 tablet (10 mg total) by mouth daily., Disp: 90 tablet, Rfl: 3   glipiZIDE (GLUCOTROL XL) 10 MG 24 hr tablet, Take 1 tablet (10 mg total) by mouth daily with breakfast., Disp: , Rfl:    losartan (COZAAR) 50 MG tablet, TAKE 1 TABLET BY MOUTH EVERY DAY, Disp: 90 tablet, Rfl: 0   methocarbamol (ROBAXIN) 500 MG tablet, TAKE 1 TABLET (500 MG TOTAL) BY MOUTH 2 (TWO) TIMES DAILY AS NEEDED FOR MUSCLE SPASMS., Disp: 20 tablet, Rfl: 0   pantoprazole (PROTONIX) 40 MG tablet, Take 1 tablet (40 mg total) by mouth daily., Disp: 90 tablet, Rfl: 1   pioglitazone (ACTOS) 15 MG tablet, TAKE 1 TABLET (15 MG TOTAL) BY MOUTH DAILY., Disp: 90 tablet, Rfl: 3   Semaglutide,0.25 or 0.5MG /DOS, (OZEMPIC, 0.25 OR 0.5 MG/DOSE,) 2 MG/3ML SOPN, INJECT 0.25MG  INTO  THE SKIN ONE TIME PER WEEK, Disp: 9 mL, Rfl: 0   sitaGLIPtin (JANUVIA) 25 MG tablet, Take 25 mg by mouth daily., Disp: , Rfl:   Observations/Objective: Patient is well-developed, well-nourished in no acute distress.  Resting comfortably at home.  Head is normocephalic, atraumatic.  No labored breathing.  Speech is clear and coherent with logical content.  Patient is alert and oriented at baseline.    Assessment and Plan: 1. Viral URI with cough - albuterol (VENTOLIN HFA) 108 (90 Base) MCG/ACT inhaler; Inhale 1-2 puffs into the lungs every 6 (six) hours as needed.  Dispense: 8 g; Refill: 0 - promethazine-dextromethorphan (PROMETHAZINE-DM) 6.25-15 MG/5ML syrup; Take 5 mLs by mouth 4 (four) times daily as needed.  Dispense: 118 mL; Refill: 0 - benzonatate (TESSALON) 100 MG capsule; Take 1 capsule (100 mg total) by mouth 3 (three) times daily as needed.  Dispense: 30 capsule; Refill: 0 - fluticasone (FLONASE) 50 MCG/ACT nasal spray; Place 2 sprays into both nostrils daily.  Dispense: 16 g; Refill: 0  - Suspect viral URI -  Promethazine DM, Tessalon, Albuterol inhaler, and Flonase prescribed - Symptomatic medications of choice over the counter as needed - Push fluids - Rest - Seek further evaluation if symptoms change or worsen   Follow Up Instructions: I discussed the assessment and treatment plan with the patient. The patient was provided an opportunity to ask questions and all were answered. The patient agreed with the plan and demonstrated an understanding of the instructions.  A copy of instructions were sent to the patient via MyChart unless otherwise noted below.    The patient was advised to call back or seek an in-person evaluation if the symptoms worsen or if the condition fails to improve as anticipated.  Time:  I spent 10 minutes with the patient via telehealth technology discussing the above problems/concerns.    Margaretann Loveless, [name]

## 2023-04-19 NOTE — Telephone Encounter (Signed)
Can do a virtual if need

## 2023-04-19 NOTE — Telephone Encounter (Signed)
  Chief Complaint: URI Symptoms: Cough, HA. Wet cough - not bringing anything up. Frequency: Monday Pertinent Negatives: Patient denies Fever Disposition: [] ED /[] Urgent Care (no appt availability in office) / [] Appointment(In office/virtual)/ [x]  [facility]/ [] Home Care/ [] Refused Recommended Disposition /[] [facility]/ []  Follow-up with PCP Additional Notes: Pt has URI and would like to be seen. No appts in office. Scheduled VV.    Summary: Seeking appt today, nothing available. Symptomatic   Pt called reporting nasal congestion, coughing, no fever, headache that comes and goes, hoarseness.  Best contact: [phone]  Seeking appt today, she declined the next available tomorrow with PCP     Reason for Disposition  Common cold with no complications  Answer Assessment - Initial Assessment Questions 1. ONSET: "When did the nasal discharge start?"      Sunday HA, Monday cough  3. COUGH: "Do you have a cough?" If Yes, ask: "Describe the color of your sputum" (clear, white, yellow, green)     Cough - wet but not productive 4. RESPIRATORY DISTRESS: "Describe your breathing."      no 5. FEVER: "Do you have a fever?" If Yes, ask: "What is your temperature, how was it measured, and when did it start?"     no 6. SEVERITY: "Overall, how bad are you feeling right now?" (e.g., doesn't interfere with normal activities, staying home from school/work, staying in bed)      5/10 7. OTHER SYMPTOMS: "Do you have any other symptoms?" (e.g., sore throat, earache, wheezing, vomiting)     HA,  Protocols used: Common Cold-A-AH

## 2023-04-23 ENCOUNTER — Ambulatory Visit (INDEPENDENT_AMBULATORY_CARE_PROVIDER_SITE_OTHER): Payer: BC Managed Care – PPO

## 2023-04-23 DIAGNOSIS — I639 Cerebral infarction, unspecified: Secondary | ICD-10-CM

## 2023-04-23 LAB — CUP PACEART REMOTE DEVICE CHECK
Date Time Interrogation Session: 20240921190612
Implantable Pulse Generator Implant Date: 20240815

## 2023-04-29 ENCOUNTER — Other Ambulatory Visit: Payer: Self-pay | Admitting: Nurse Practitioner

## 2023-04-29 DIAGNOSIS — E119 Type 2 diabetes mellitus without complications: Secondary | ICD-10-CM

## 2023-04-29 DIAGNOSIS — E1165 Type 2 diabetes mellitus with hyperglycemia: Secondary | ICD-10-CM

## 2023-04-30 ENCOUNTER — Other Ambulatory Visit: Payer: Self-pay | Admitting: Nurse Practitioner

## 2023-04-30 DIAGNOSIS — E785 Hyperlipidemia, unspecified: Secondary | ICD-10-CM

## 2023-04-30 DIAGNOSIS — E119 Type 2 diabetes mellitus without complications: Secondary | ICD-10-CM

## 2023-04-30 NOTE — Telephone Encounter (Signed)
Requested medication (s) are due for refill today: For review  Requested medication (s) are on the active medication list: Yes    Last refill: 03/16/23  amount not specified  Future visit scheduled yes 06/19/23  Notes to clinic:  Please review, quantity not specified.  Requested Prescriptions  Pending Prescriptions Disp Refills   glipiZIDE (GLUCOTROL XL) 10 MG 24 hr tablet [Pharmacy Med Name: GLIPIZIDE ER 10 MG TABLET] 180 tablet 3    Sig: TAKE 1 TABLET BY MOUTH TWICE A DAY     Endocrinology:  Diabetes - Sulfonylureas Passed - 04/29/2023 10:30 PM      Passed - HBA1C is between 0 and 7.9 and within 180 days    Hgb A1c MFr Bld  Date Value Ref Range Status  03/16/2023 6.5 (H) <5.7 % of total Hgb Final    Comment:    For someone without known diabetes, a hemoglobin A1c value of 6.5% or greater indicates that they may have  diabetes and this should be confirmed with a follow-up  test. . For someone with known diabetes, a value <7% indicates  that their diabetes is well controlled and a value  greater than or equal to 7% indicates suboptimal  control. A1c targets should be individualized based on  duration of diabetes, age, comorbid conditions, and  other considerations. . Currently, no consensus exists regarding use of hemoglobin A1c for diagnosis of diabetes for children. .          Passed - Cr in normal range and within 360 days    Creat  Date Value Ref Range Status  03/16/2023 0.67 0.50 - 1.05 mg/dL Final   Creatinine, Urine  Date Value Ref Range Status  11/07/2022 118 20 - 275 mg/dL Final         Passed - Valid encounter within last 6 months    Recent Outpatient Visits           1 month ago Uncontrolled type 2 diabetes mellitus with hyperglycemia, without long-term current use of insulin [name]   [name], [name]   4 months ago History of stroke   [name], [name]   4 months ago  Acute nonintractable headache, unspecified headache type   [name], [name]   4 months ago Type 2 diabetes mellitus without complication, without long-term current use of insulin [name]    [facility] Mecum, [name], [name]   5 months ago Annual physical exam   [facility] Berniece Salines, [name]       Future Appointments             In 1 month [name], [name], [name] [facility], [name]   In 6 months [name], [name], [name] [name]            Signed Prescriptions Disp Refills   pioglitazone (ACTOS) 15 MG tablet 90 tablet 0    Sig: TAKE 1 TABLET (15 MG TOTAL) BY MOUTH DAILY.     Endocrinology:  Diabetes - Glitazones - pioglitazone Passed - 04/29/2023 10:30 PM      Passed - HBA1C is between 0 and 7.9 and within 180 days    Hgb A1c MFr Bld  Date Value Ref Range Status  03/16/2023 6.5 (H) <5.7 % of total Hgb Final    Comment:    For  someone without known diabetes, a hemoglobin A1c value of 6.5% or greater indicates that they may have  diabetes and this should be confirmed with a follow-up  test. . For someone with known diabetes, a value <7% indicates  that their diabetes is well controlled and a value  greater than or equal to 7% indicates suboptimal  control. A1c targets should be individualized based on  duration of diabetes, age, comorbid conditions, and  other considerations. . Currently, no consensus exists regarding use of hemoglobin A1c for diagnosis of diabetes for children. Verna Czech - Valid encounter within last 6 months    Recent Outpatient Visits           1 month ago Uncontrolled type 2 diabetes mellitus with hyperglycemia, without long-term current use of insulin [name]   [name], [name]   4 months ago History of stroke   [name], [name]   4 months ago Acute nonintractable headache, unspecified headache type   [name], [name]   4 months ago Type 2 diabetes mellitus without complication, without long-term current use of insulin [name], [name], [name]   5 months ago Annual physical exam   [name], [name]       Future Appointments             In 1 month [name], [name], [name] [name], [name]   In 6 months [name], [name], [name] [name], [name]

## 2023-04-30 NOTE — Telephone Encounter (Signed)
Requested Prescriptions  Pending Prescriptions Disp Refills   glipiZIDE (GLUCOTROL XL) 10 MG 24 hr tablet [Pharmacy Med Name: GLIPIZIDE ER 10 MG TABLET] 180 tablet 3    Sig: TAKE 1 TABLET BY MOUTH TWICE A DAY     Endocrinology:  Diabetes - Sulfonylureas Passed - 04/29/2023 10:30 PM      Passed - HBA1C is between 0 and 7.9 and within 180 days    Hgb A1c MFr Bld  Date Value Ref Range Status  03/16/2023 6.5 (H) <5.7 % of total Hgb Final    Comment:    For someone without known diabetes, a hemoglobin A1c value of 6.5% or greater indicates that they may have  diabetes and this should be confirmed with a follow-up  test. . For someone with known diabetes, a value <7% indicates  that their diabetes is well controlled and a value  greater than or equal to 7% indicates suboptimal  control. A1c targets should be individualized based on  duration of diabetes, age, comorbid conditions, and  other considerations. . Currently, no consensus exists regarding use of hemoglobin A1c for diagnosis of diabetes for children. .          Passed - Cr in normal range and within 360 days    Creat  Date Value Ref Range Status  03/16/2023 0.67 0.50 - 1.05 mg/dL Final   Creatinine, Urine  Date Value Ref Range Status  11/07/2022 118 20 - 275 mg/dL Final         Passed - Valid encounter within last 6 months    Recent Outpatient Visits           1 month ago Uncontrolled type 2 diabetes mellitus with hyperglycemia, without long-term current use of insulin [facility])   [place] [facility] Berniece Salines, [name]   4 months ago History of stroke   [name], [name]   4 months ago Acute nonintractable headache, unspecified headache type   [facility] Della Goo F, [name]   4 months ago Type 2 diabetes mellitus without complication, without long-term current use of insulin (HCC)   [place] [facility] Mecum,  [name], [name]   5 months ago Annual physical exam   [facility] Berniece Salines, [name]       Future Appointments             In 1 month [name], [name], [name] [facility], [name]   In 6 months [name], [name], [name] [facility], [name]             pioglitazone (ACTOS) 15 MG tablet [Pharmacy Med Name: PIOGLITAZONE HCL 15 MG TABLET] 90 tablet 0    Sig: TAKE 1 TABLET (15 MG TOTAL) BY MOUTH DAILY.     Endocrinology:  Diabetes - Glitazones - pioglitazone Passed - 04/29/2023 10:30 PM      Passed - HBA1C is between 0 and 7.9 and within 180 days    Hgb A1c MFr Bld  Date Value Ref Range Status  03/16/2023 6.5 (H) <5.7 % of total Hgb Final    Comment:    For someone without known diabetes, a hemoglobin A1c value of 6.5% or greater indicates that they may have  diabetes and this should be confirmed with a follow-up  test. . For someone with known diabetes, a value <7% indicates  that their diabetes is well  controlled and a value  greater than or equal to 7% indicates suboptimal  control. A1c targets should be individualized based on  duration of diabetes, age, comorbid conditions, and  other considerations. . Currently, no consensus exists regarding use of hemoglobin A1c for diagnosis of diabetes for children. Verna Czech - Valid encounter within last 6 months    Recent Outpatient Visits           1 month ago Uncontrolled type 2 diabetes mellitus with hyperglycemia, without long-term current use of insulin [facility])   [place] [facility] Berniece Salines, [name]   4 months ago History of stroke   [facility] Della Goo F, [name]   4 months ago Acute nonintractable headache, unspecified headache type   [facility] Della Goo F, [name]   4 months ago Type 2 diabetes mellitus without complication, without long-term current use of insulin  [facility])   [place] [facility] Mecum, [name], [name]   5 months ago Annual physical exam   [facility] Berniece Salines, [name]       Future Appointments             In 1 month [name], [name], [name] [facility], [name]   In 6 months [name], [name], [name] [facility], [facility]

## 2023-05-01 ENCOUNTER — Other Ambulatory Visit: Payer: Self-pay

## 2023-05-01 DIAGNOSIS — E1165 Type 2 diabetes mellitus with hyperglycemia: Secondary | ICD-10-CM

## 2023-05-01 NOTE — Telephone Encounter (Signed)
Requested Prescriptions  Pending Prescriptions Disp Refills   atorvastatin (LIPITOR) 80 MG tablet [Pharmacy Med Name: ATORVASTATIN 80 MG TABLET] 90 tablet 2    Sig: TAKE 1 TABLET BY MOUTH EVERY DAY     Cardiovascular:  Antilipid - Statins Failed - 04/30/2023  4:01 PM      Failed - Lipid Panel in normal range within the last 12 months    Cholesterol, Total  Date Value Ref Range Status  08/30/2015 151 100 - 199 mg/dL Final   Cholesterol  Date Value Ref Range Status  12/08/2022 139 0 - 200 mg/dL Final   LDL Cholesterol (Calc)  Date Value Ref Range Status  11/07/2022 157 (H) mg/dL (calc) Final    Comment:    Reference range: <100 . Desirable range <100 mg/dL for primary prevention;   <70 mg/dL for patients with CHD or diabetic patients  with > or = 2 CHD risk factors. Marland Kitchen LDL-C is now calculated using the Martin-Hopkins  calculation, which is a validated novel method providing  better accuracy than the Friedewald equation in the  estimation of LDL-C.  Horald Pollen et al. Lenox Ahr. 8469;629(52): 2061-2068  (http://education.Quest[employer].com/faq/FAQ164)    LDL Cholesterol  Date Value Ref Range Status  12/08/2022 78 0 - 99 mg/dL Final    Comment:           Total Cholesterol/HDL:CHD Risk Coronary Heart Disease Risk Table                     Men   Women  1/2 Average Risk   3.4   3.3  Average Risk       5.0   4.4  2 X Average Risk   9.6   7.1  3 X Average Risk  23.4   11.0        Use the calculated Patient Ratio above and the CHD Risk Table to determine the patient's CHD Risk.        ATP III CLASSIFICATION (LDL):  <100     mg/dL   Optimal  841-324  mg/dL   Near or Above                    Optimal  130-159  mg/dL   Borderline  401-027  mg/dL   High  >253     mg/dL   Very High Performed at [facility], [address] [place], [place] [address]    HDL  Date Value Ref Range Status  12/08/2022 39 (L) >40 mg/dL Final  34/74/2595 41 >63 mg/dL Final    Triglycerides  Date Value Ref Range Status  12/08/2022 111 <150 mg/dL Final         Passed - Patient is not pregnant      Passed - Valid encounter within last 12 months    Recent Outpatient Visits           1 month ago Uncontrolled type 2 diabetes mellitus with hyperglycemia, without long-term current use of insulin [facility])   [place] [facility] Berniece Salines, [name]   4 months ago History of stroke   [facility] Della Goo F, [name]   4 months ago Acute nonintractable headache, unspecified headache type   [facility] Della Goo F, [name]   4 months ago Type 2 diabetes mellitus without complication, without long-term current use of insulin [facility])   [place] [facility] Mecum, [name], [place]  5 months ago Annual physical exam   [facility] Berniece Salines, [name]       Future Appointments             In 1 month [name], [name], [name] [facility], [name]   In 6 months [name], [name], [name] [facility], [place]

## 2023-05-03 ENCOUNTER — Other Ambulatory Visit: Payer: Self-pay | Admitting: Nurse Practitioner

## 2023-05-03 DIAGNOSIS — E119 Type 2 diabetes mellitus without complications: Secondary | ICD-10-CM

## 2023-05-03 DIAGNOSIS — E785 Hyperlipidemia, unspecified: Secondary | ICD-10-CM

## 2023-05-03 NOTE — Telephone Encounter (Signed)
Requested Prescriptions  Pending Prescriptions Disp Refills   atorvastatin (LIPITOR) 80 MG tablet [Pharmacy Med Name: ATORVASTATIN 80 MG TABLET] 90 tablet 1    Sig: TAKE 1 TABLET BY MOUTH EVERY DAY     Cardiovascular:  Antilipid - Statins Failed - 05/03/2023 10:16 AM      Failed - Lipid Panel in normal range within the last 12 months    Cholesterol, Total  Date Value Ref Range Status  08/30/2015 151 100 - 199 mg/dL Final   Cholesterol  Date Value Ref Range Status  12/08/2022 139 0 - 200 mg/dL Final   LDL Cholesterol (Calc)  Date Value Ref Range Status  11/07/2022 157 (H) mg/dL (calc) Final    Comment:    Reference range: <100 . Desirable range <100 mg/dL for primary prevention;   <70 mg/dL for patients with CHD or diabetic patients  with > or = 2 CHD risk factors. Marland Kitchen LDL-C is now calculated using the Martin-Hopkins  calculation, which is a validated novel method providing  better accuracy than the Friedewald equation in the  estimation of LDL-C.  Horald Pollen et al. Lenox Ahr. 5366;440(34): 2061-2068  (http://education.[facility].com/faq/FAQ164)    LDL Cholesterol  Date Value Ref Range Status  12/08/2022 78 0 - 99 mg/dL Final    Comment:           Total Cholesterol/HDL:CHD Risk Coronary Heart Disease Risk Table                     Men   Women  1/2 Average Risk   3.4   3.3  Average Risk       5.0   4.4  2 X Average Risk   9.6   7.1  3 X Average Risk  23.4   11.0        Use the calculated Patient Ratio above and the CHD Risk Table to determine the patient's CHD Risk.        ATP III CLASSIFICATION (LDL):  <100     mg/dL   Optimal  742-595  mg/dL   Near or Above                    Optimal  130-159  mg/dL   Borderline  638-756  mg/dL   High  >433     mg/dL   Very High Performed at [facility], [address] [place], [place] [address]    HDL  Date Value Ref Range Status  12/08/2022 39 (L) >40 mg/dL Final  84/16/6063 41 >01 mg/dL Final    Triglycerides  Date Value Ref Range Status  12/08/2022 111 <150 mg/dL Final         Passed - Patient is not pregnant      Passed - Valid encounter within last 12 months    Recent Outpatient Visits           1 month ago Uncontrolled type 2 diabetes mellitus with hyperglycemia, without long-term current use of insulin [facility])   [place] [facility] Berniece Salines, [name]   4 months ago History of stroke   [facility] Della Goo F, [name]   4 months ago Acute nonintractable headache, unspecified headache type   [name], [name]   5 months ago Type 2 diabetes mellitus without complication, without long-term current use of insulin [facility])   [place] [facility] Mecum, [name], [place]  5 months ago Annual physical exam   [facility] Berniece Salines, [name]       Future Appointments             In 1 month [name], [name], [name] [facility], [name]   In 6 months [name], [name], [name] [facility], [facility]

## 2023-05-06 NOTE — Progress Notes (Unsigned)
Virtual Visit via Telephone Note   Because of Elizabeth Scott's co-morbid illnesses, she is at least at moderate risk for complications without adequate follow up.  This format is felt to be most appropriate for this patient at this time.  The patient did not have access to video technology/had technical difficulties with video requiring transitioning to audio format only (telephone).  All issues noted in this document were discussed and addressed.  No physical exam could be performed with this format.  Please refer to the patient's chart for her consent to telehealth for [facility].  Evaluation Performed:  Preoperative cardiovascular risk assessment _____________   Date:  05/07/2023   Patient ID:  Elizabeth Scott, DOB November 04, 1960, MRN [id number] Patient Location:  Home Provider location:   Office  Primary Care Provider:  Berniece Salines, [name] Primary Cardiologist:  Dr. Lalla Brothers   Chief Complaint / Patient Profile   62 y.o. y/o female with a h/o cryptogenic stroke, hypertension, hyperlipidemia, GERD, diabetes, history of COVID-19 virus in the past.  No history of atrial fibrillation.  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  She  is pending colonoscopy on 05/22/2023 with [facility] and presents today for telephonic preoperative cardiovascular risk assessment.  History of Present Illness    Elizabeth Scott is a 62 y.o. female who presents via audio/video conferencing for a telehealth visit today.  Pt was last seen in cardiology clinic on 03/15/2023 by Dr. Lalla Brothers, MD for ILR implantation.  At that time Noni Stonesifer was doing well .  The patient is now pending procedure as outlined above. Since her last visit, she has done well.  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  She is active and walks with her sister daily.  She is recently been to the fair with her grandson and had no trouble walking or  participating in activities concerning her heart.  She denies any chest pain dyspnea on exertion fatigue.  She also denies any issues with bleeding on dual antiplatelet therapy.  She is medically compliant.  Past Medical History    Past Medical History:  Diagnosis Date   Allergy    Chronic sinusitis    COVID-19    Diabetes mellitus without complication (HCC)    GERD (gastroesophageal reflux disease)    High cholesterol    History of abnormal cervical Pap smear    Prior to Hysterectomy, normal paps for 2-3 years after Hysterectomy   History of gestational diabetes    Hypertension    Hypopotassemia    Microcytosis    Past Surgical History:  Procedure Laterality Date   ABDOMINAL HYSTERECTOMY  2003   still has ovaries-for menorrhagia   CESAREAN SECTION     COLONOSCOPY  02/22/14   repeat 5 years   ESOPHAGOGASTRODUODENOSCOPY  02/22/14   TUBAL LIGATION      Allergies  Allergies  Allergen Reactions   Metformin And Related Other (See Comments)    Rash, Thrush    Other     walnuts    Home Medications    Prior to Admission medications   Medication Sig Start Date End Date Taking? Authorizing Provider  albuterol (VENTOLIN HFA) 108 (90 Base) MCG/ACT inhaler Inhale 1-2 puffs into the lungs every 6 (six) hours as needed. 04/19/23   Margaretann Loveless, [name]  amLODipine (NORVASC) 5 MG tablet Take 1 tablet (5 mg total) by mouth daily. 03/31/22   [name],  [name], [name]  aspirin EC 81 MG tablet Take 81 mg by mouth daily. 08/23/21   [provider]  atorvastatin (LIPITOR) 80 MG tablet TAKE 1 TABLET BY MOUTH EVERY DAY 05/03/23   Berniece Salines, [name]  benzonatate (TESSALON) 100 MG capsule Take 1 capsule (100 mg total) by mouth 3 (three) times daily as needed. 04/19/23   Margaretann Loveless, [name]  clopidogrel (PLAVIX) 75 MG tablet Take 1 tablet (75 mg total) by mouth every evening. 12/08/22   Alford Highland, MD  ezetimibe (ZETIA) 10 MG tablet Take 1 tablet (10 mg total) by mouth daily.  05/26/22   Berniece Salines, [name]  fluticasone (FLONASE) 50 MCG/ACT nasal spray Place 2 sprays into both nostrils daily. 04/19/23   Margaretann Loveless, [name]  glipiZIDE (GLUCOTROL XL) 10 MG 24 hr tablet TAKE 1 TABLET BY MOUTH TWICE A DAY 05/01/23   Berniece Salines, [name]  losartan (COZAAR) 50 MG tablet TAKE 1 TABLET BY MOUTH EVERY DAY 12/11/22   Berniece Salines, [name]  methocarbamol (ROBAXIN) 500 MG tablet TAKE 1 TABLET (500 MG TOTAL) BY MOUTH 2 (TWO) TIMES DAILY AS NEEDED FOR MUSCLE SPASMS. 03/28/23   Berniece Salines, [name]  pantoprazole (PROTONIX) 40 MG tablet Take 1 tablet (40 mg total) by mouth daily. 03/16/23   Berniece Salines, [name]  pioglitazone (ACTOS) 15 MG tablet TAKE 1 TABLET (15 MG TOTAL) BY MOUTH DAILY. 04/30/23   Berniece Salines, [name]  promethazine-dextromethorphan (PROMETHAZINE-DM) 6.25-15 MG/5ML syrup Take 5 mLs by mouth 4 (four) times daily as needed. 04/19/23   Margaretann Loveless, [name]  Semaglutide,0.25 or 0.5MG /DOS, (OZEMPIC, 0.25 OR 0.5 MG/DOSE,) 2 MG/3ML SOPN INJECT 0.25MG  INTO THE SKIN ONE TIME PER WEEK 02/12/23   Berniece Salines, [name]  sitaGLIPtin (JANUVIA) 25 MG tablet Take 25 mg by mouth daily.    [provider]    Physical Exam    Vital Signs:  Doretta Remmert does not have vital signs available for review today.  Given telephonic nature of communication, physical exam is limited. AAOx3. NAD. Normal affect.  Speech and respirations are unlabored.  Accessory Clinical Findings    None  Assessment & Plan    1.  Preoperative Cardiovascular Risk Assessment:  According to the Revised Cardiac Risk Index (RCRI), her Perioperative Risk of Major Cardiac Event is (%): 0.9  Her Functional Capacity in METs is: 9.89 according to the Duke Activity Status Index (DASI).   Per office protocol, if patient is without any new symptoms or concerns at the time of their virtual visit, he/she may hold Plavix for 5 days prior to procedure. Please resume Plavix as soon as possible  postprocedure, at the discretion of the surgeon.    Per office protocol, if patient is without any new symptoms or concerns at the time of their virtual visit, she may hold ASA for 7 days prior to procedure. Please resume ASA as soon as possible postprocedure, at the discretion of the surgeon.    The patient was advised that if she develops new symptoms prior to surgery to contact our office to arrange for a follow-up visit, and she verbalized understanding.  Therefore, based on ACC/AHA guidelines, patient would be at acceptable risk for the planned procedure without further cardiovascular testing. I will route this recommendation to the requesting party via Epic fax function.   A copy of this note will be routed to requesting surgeon.  Time:   Today, I have spent 10 minutes with  the patient with telehealth technology discussing medical history, symptoms, and management plan.     Joni Reining, NP  05/07/2023, 2:22 PM

## 2023-05-07 ENCOUNTER — Ambulatory Visit: Payer: BC Managed Care – PPO | Attending: Cardiology

## 2023-05-07 DIAGNOSIS — Z01810 Encounter for preprocedural cardiovascular examination: Secondary | ICD-10-CM | POA: Diagnosis not present

## 2023-05-07 DIAGNOSIS — Z01818 Encounter for other preprocedural examination: Secondary | ICD-10-CM

## 2023-05-08 NOTE — Progress Notes (Signed)
Carelink Summary Report / Loop Recorder 

## 2023-05-09 NOTE — Telephone Encounter (Signed)
Per office protocol, if patient is without any new symptoms or concerns at the time of their virtual visit, he/she may hold Plavix for 5 days prior to procedure. Please resume Plavix as soon as possible postprocedure, at the discretion of the surgeon.     Per office protocol, if patient is without any new symptoms or concerns at the time of their virtual visit, she may hold ASA for 7 days prior to procedure. Please resume ASA as soon as possible postprocedure, at the discretion of the surgeon.

## 2023-05-09 NOTE — Telephone Encounter (Signed)
Per Cardiology  Date:  05/07/2023    Patient ID:  Lakeysha, Slutsky 28-Apr-1961, MRN [id number] Patient Location:  Home Provider location:   Office   Primary Care Provider:  Berniece Salines, [name] Primary Cardiologist:  Dr. Lalla Brothers    Chief Complaint / Patient Profile    62 y.o. y/o female with a h/o cryptogenic stroke, hypertension, hyperlipidemia, GERD, diabetes, history of COVID-19 virus in the past.  No history of atrial fibrillation.  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   She  is pending colonoscopy on 05/22/2023 with [facility] and presents today for telephonic preoperative cardiovascular risk assessment.  Assessment & Plan    1.  Preoperative Cardiovascular Risk Assessment:   According to the Revised Cardiac Risk Index (RCRI), her Perioperative Risk of Major Cardiac Event is (%): 0.9   Her Functional Capacity in METs is: 9.89 according to the Duke Activity Status Index (DASI).    Per office protocol, if patient is without any new symptoms or concerns at the time of their virtual visit, he/she may hold Plavix for 5 days prior to procedure. Please resume Plavix as soon as possible postprocedure, at the discretion of the surgeon.     Per office protocol, if patient is without any new symptoms or concerns at the time of their virtual visit, she may hold ASA for 7 days prior to procedure. Please resume ASA as soon as possible postprocedure, at the discretion of the surgeon.     The patient was advised that if she develops new symptoms prior to surgery to contact our office to arrange for a follow-up visit, and she verbalized understanding.   Therefore, based on ACC/AHA guidelines, patient would be at acceptable risk for the planned procedure without further cardiovascular testing. I will route this recommendation to the requesting party via Epic fax function.    A copy of this note will be routed to requesting surgeon.   Time:   Today, I have spent 10 minutes with  the patient with telehealth technology discussing medical history, symptoms, and management plan.       Joni Reining, NP   05/07/2023, 2:22 PM

## 2023-05-11 ENCOUNTER — Other Ambulatory Visit: Payer: Self-pay | Admitting: Nurse Practitioner

## 2023-05-11 DIAGNOSIS — E785 Hyperlipidemia, unspecified: Secondary | ICD-10-CM

## 2023-05-11 NOTE — Telephone Encounter (Signed)
Requested Prescriptions  Pending Prescriptions Disp Refills   ezetimibe (ZETIA) 10 MG tablet [Pharmacy Med Name: EZETIMIBE 10 MG TABLET] 30 tablet 0    Sig: TAKE 1 TABLET BY MOUTH EVERY DAY     Cardiovascular:  Antilipid - Sterol Transport Inhibitors Failed - 05/11/2023  8:31 AM      Failed - Lipid Panel in normal range within the last 12 months    Cholesterol, Total  Date Value Ref Range Status  08/30/2015 151 100 - 199 mg/dL Final   Cholesterol  Date Value Ref Range Status  12/08/2022 139 0 - 200 mg/dL Final   LDL Cholesterol (Calc)  Date Value Ref Range Status  11/07/2022 157 (H) mg/dL (calc) Final    Comment:    Reference range: <100 . Desirable range <100 mg/dL for primary prevention;   <70 mg/dL for patients with CHD or diabetic patients  with > or = 2 CHD risk factors. Marland Kitchen LDL-C is now calculated using the Martin-Hopkins  calculation, which is a validated novel method providing  better accuracy than the Friedewald equation in the  estimation of LDL-C.  Horald Pollen et al. Lenox Ahr. 1610;960(45): 2061-2068  (http://education.[facility].com/faq/FAQ164)    LDL Cholesterol  Date Value Ref Range Status  12/08/2022 78 0 - 99 mg/dL Final    Comment:           Total Cholesterol/HDL:CHD Risk Coronary Heart Disease Risk Table                     Men   Women  1/2 Average Risk   3.4   3.3  Average Risk       5.0   4.4  2 X Average Risk   9.6   7.1  3 X Average Risk  23.4   11.0        Use the calculated Patient Ratio above and the CHD Risk Table to determine the patient's CHD Risk.        ATP III CLASSIFICATION (LDL):  <100     mg/dL   Optimal  409-811  mg/dL   Near or Above                    Optimal  130-159  mg/dL   Borderline  914-782  mg/dL   High  >956     mg/dL   Very High Performed at [facility], [address] [place], [place] [address]    HDL  Date Value Ref Range Status  12/08/2022 39 (L) >40 mg/dL Final  65/78/4696 41 >29 mg/dL Final    Triglycerides  Date Value Ref Range Status  12/08/2022 111 <150 mg/dL Final         Passed - AST in normal range and within 360 days    AST  Date Value Ref Range Status  03/16/2023 14 10 - 35 U/L Final         Passed - ALT in normal range and within 360 days    ALT  Date Value Ref Range Status  03/16/2023 12 6 - 29 U/L Final         Passed - Patient is not pregnant      Passed - Valid encounter within last 12 months    Recent Outpatient Visits           1 month ago Uncontrolled type 2 diabetes mellitus with hyperglycemia, without long-term current use of insulin [facility])   [place] [facility],  [name], [name]   4 months ago History of stroke   [facility] Della Goo F, [name]   5 months ago Acute nonintractable headache, unspecified headache type   [facility] Della Goo F, [name]   5 months ago Type 2 diabetes mellitus without complication, without long-term current use of insulin [facility])   [place] [facility] Mecum, [name], [name]   6 months ago Annual physical exam   [facility] Berniece Salines, [name]       Future Appointments             In 1 month [name], [name], [name] [facility], [name]   In 6 months [name], [name], [name] [facility], [facility]

## 2023-05-12 ENCOUNTER — Other Ambulatory Visit: Payer: Self-pay | Admitting: Nurse Practitioner

## 2023-05-12 DIAGNOSIS — I10 Essential (primary) hypertension: Secondary | ICD-10-CM

## 2023-05-13 ENCOUNTER — Other Ambulatory Visit: Payer: Self-pay | Admitting: Nurse Practitioner

## 2023-05-13 DIAGNOSIS — I10 Essential (primary) hypertension: Secondary | ICD-10-CM

## 2023-05-13 DIAGNOSIS — E119 Type 2 diabetes mellitus without complications: Secondary | ICD-10-CM

## 2023-05-14 NOTE — Telephone Encounter (Signed)
Requested Prescriptions  Pending Prescriptions Disp Refills   amLODipine (NORVASC) 5 MG tablet [Pharmacy Med Name: AMLODIPINE BESYLATE 5 MG TAB] 90 tablet 3    Sig: TAKE 1 TABLET (5 MG TOTAL) BY MOUTH DAILY.     Cardiovascular: Calcium Channel Blockers 2 Passed - 05/12/2023  8:24 AM      Passed - Last BP in normal range    BP Readings from Last 1 Encounters:  03/16/23 124/82         Passed - Last Heart Rate in normal range    Pulse Readings from Last 1 Encounters:  03/16/23 89         Passed - Valid encounter within last 6 months    Recent Outpatient Visits           1 month ago Uncontrolled type 2 diabetes mellitus with hyperglycemia, without long-term current use of insulin [facility])   [facility] Berniece Salines, [name]   5 months ago History of stroke   [name], [name]   5 months ago Acute nonintractable headache, unspecified headache type   [facility] Della Goo F, [name]   5 months ago Type 2 diabetes mellitus without complication, without long-term current use of insulin [facility])   [facility] Mecum, [name], [name]   6 months ago Annual physical exam   [facility] Berniece Salines, [name]       Future Appointments             In 1 month [name], [name], [name] [name]   In 5 months [name], [name], [name] [facility], [facility]

## 2023-05-14 NOTE — Telephone Encounter (Signed)
Requested Prescriptions  Pending Prescriptions Disp Refills   Semaglutide,0.25 or 0.5MG /DOS, (OZEMPIC, 0.25 OR 0.5 MG/DOSE,) 2 MG/3ML SOPN [Pharmacy Med Name: OZEMPIC 0.25-0.5 MG/DOSE PEN] 9 mL 0    Sig: INJECT 0.25 MG UNDER THE SKIN ONE TIME PER WEEK     Endocrinology:  Diabetes - GLP-1 Receptor Agonists - semaglutide Failed - 05/13/2023  3:44 PM      Failed - HBA1C in normal range and within 180 days    Hgb A1c MFr Bld  Date Value Ref Range Status  03/16/2023 6.5 (H) <5.7 % of total Hgb Final    Comment:    For someone without known diabetes, a hemoglobin A1c value of 6.5% or greater indicates that they may have  diabetes and this should be confirmed with a follow-up  test. . For someone with known diabetes, a value <7% indicates  that their diabetes is well controlled and a value  greater than or equal to 7% indicates suboptimal  control. A1c targets should be individualized based on  duration of diabetes, age, comorbid conditions, and  other considerations. . Currently, no consensus exists regarding use of hemoglobin A1c for diagnosis of diabetes for children. .          Passed - Cr in normal range and within 360 days    Creat  Date Value Ref Range Status  03/16/2023 0.67 0.50 - 1.05 mg/dL Final   Creatinine, Urine  Date Value Ref Range Status  11/07/2022 118 20 - 275 mg/dL Final         Passed - Valid encounter within last 6 months    Recent Outpatient Visits           1 month ago Uncontrolled type 2 diabetes mellitus with hyperglycemia, without long-term current use of insulin [facility])   [facility] Berniece Salines, [name]   5 months ago History of stroke   [name], [name]   5 months ago Acute nonintractable headache, unspecified headache type   [name], [name]   5 months ago Type 2 diabetes mellitus without complication, without long-term current use of  insulin (HCC)   [facility] Mecum, [name], [name]   6 months ago Annual physical exam   [facility] Berniece Salines, [name]       Future Appointments             In 1 month [name], [name], [name] [facility], [name]   In 5 months [name], [name], [name] [facility], [name]             losartan (COZAAR) 50 MG tablet [Pharmacy Med Name: LOSARTAN POTASSIUM 50 MG TAB] 90 tablet 0    Sig: TAKE 1 TABLET BY MOUTH EVERY DAY     Cardiovascular:  Angiotensin Receptor Blockers Passed - 05/13/2023  3:44 PM      Passed - Cr in normal range and within 180 days    Creat  Date Value Ref Range Status  03/16/2023 0.67 0.50 - 1.05 mg/dL Final   Creatinine, Urine  Date Value Ref Range Status  11/07/2022 118 20 - 275 mg/dL Final         Passed - K in normal range and within 180 days    Potassium  Date Value Ref Range Status  03/16/2023 4.1 3.5 - 5.3 mmol/L Final  Passed - Patient is not pregnant      Passed - Last BP in normal range    BP Readings from Last 1 Encounters:  03/16/23 124/82         Passed - Valid encounter within last 6 months    Recent Outpatient Visits           1 month ago Uncontrolled type 2 diabetes mellitus with hyperglycemia, without long-term current use of insulin [facility])   [facility] Berniece Salines, [name]   5 months ago History of stroke   [facility] Della Goo F, [name]   5 months ago Acute nonintractable headache, unspecified headache type   [facility] Della Goo F, [name]   5 months ago Type 2 diabetes mellitus without complication, without long-term current use of insulin [facility])   [facility] Mecum, [name], [name]   6 months ago Annual physical exam   [facility] Berniece Salines, [name]       Future Appointments              In 1 month [name], [name], [name] [facility], [name]   In 5 months [name], [name], [name] [facility]

## 2023-05-15 ENCOUNTER — Encounter: Payer: Self-pay | Admitting: Gastroenterology

## 2023-05-16 NOTE — Telephone Encounter (Signed)
Patient have been notified to stop her Plavix and Asprin as reccommended   Patient verbalized understanding.

## 2023-05-22 ENCOUNTER — Ambulatory Visit
Admission: RE | Admit: 2023-05-22 | Discharge: 2023-05-22 | Disposition: A | Discharge status: Home / Self Care | Payer: No Typology Code available for payment source | Attending: Gastroenterology | Admitting: Gastroenterology

## 2023-05-22 ENCOUNTER — Ambulatory Visit: Payer: No Typology Code available for payment source | Admitting: Anesthesiology

## 2023-05-22 ENCOUNTER — Encounter
Admission: RE | Disposition: A | Discharge status: Home / Self Care | Payer: Self-pay | Source: Home / Self Care | Attending: Gastroenterology

## 2023-05-22 ENCOUNTER — Encounter: Payer: Self-pay | Admitting: Gastroenterology

## 2023-05-22 DIAGNOSIS — Z1211 Encounter for screening for malignant neoplasm of colon: Secondary | ICD-10-CM | POA: Diagnosis present

## 2023-05-22 DIAGNOSIS — K219 Gastro-esophageal reflux disease without esophagitis: Secondary | ICD-10-CM | POA: Diagnosis not present

## 2023-05-22 DIAGNOSIS — Z7985 Long-term (current) use of injectable non-insulin antidiabetic drugs: Secondary | ICD-10-CM | POA: Insufficient documentation

## 2023-05-22 DIAGNOSIS — Z8673 Personal history of transient ischemic attack (TIA), and cerebral infarction without residual deficits: Secondary | ICD-10-CM | POA: Diagnosis not present

## 2023-05-22 DIAGNOSIS — I10 Essential (primary) hypertension: Secondary | ICD-10-CM | POA: Insufficient documentation

## 2023-05-22 DIAGNOSIS — E119 Type 2 diabetes mellitus without complications: Secondary | ICD-10-CM | POA: Diagnosis not present

## 2023-05-22 DIAGNOSIS — K640 First degree hemorrhoids: Secondary | ICD-10-CM | POA: Insufficient documentation

## 2023-05-22 HISTORY — PX: COLONOSCOPY WITH PROPOFOL: SHX5780

## 2023-05-22 LAB — GLUCOSE, CAPILLARY: Glucose-Capillary: 137 mg/dL — ABNORMAL HIGH (ref 70–99)

## 2023-05-22 SURGERY — COLONOSCOPY WITH PROPOFOL
Anesthesia: General

## 2023-05-22 MED ORDER — PROPOFOL 1000 MG/100ML IV EMUL
INTRAVENOUS | Status: AC
  Filled 2023-05-22: qty 100

## 2023-05-22 MED ORDER — PROPOFOL 500 MG/50ML IV EMUL
INTRAVENOUS | Status: DC | PRN
  Administered 2023-05-22: 60 mg via INTRAVENOUS
  Administered 2023-05-22: 145 ug/kg/min via INTRAVENOUS

## 2023-05-22 MED ORDER — SODIUM CHLORIDE 0.9 % IV SOLN
INTRAVENOUS | Status: DC
  Administered 2023-05-22: 500 mL via INTRAVENOUS

## 2023-05-22 NOTE — Transfer of Care (Signed)
Immediate Anesthesia Transfer of Care Note  Patient: Elizabeth Scott  Procedure(s) Performed: COLONOSCOPY WITH PROPOFOL  Patient Location: PACU  Anesthesia Type:General  Level of Consciousness: awake and alert   Airway & Oxygen Therapy: Patient Spontanous Breathing and Patient connected to nasal cannula oxygen  Post-op Assessment: Report given to RN and Post -op Vital signs reviewed and stable  Post vital signs: Reviewed and stable  Last Vitals:  Vitals Value Taken Time  BP 160/70 05/22/23 0901  Temp 35.6 C 05/22/23 0901  Pulse 71 05/22/23 0902  Resp 12 05/22/23 0902  SpO2 100 % 05/22/23 0902  Vitals shown include unfiled device data.  Last Pain:  Vitals:   05/22/23 0901  TempSrc: Temporal  PainSc: 0-No pain         Complications: No notable events documented.

## 2023-05-22 NOTE — Op Note (Signed)
[facility] Gastroenterology Patient Name: Elizabeth Scott Procedure Date: 05/22/2023 8:23 AM MRN: [id number] Account #: [id number] Date of Birth: [birth year] Admit Type: Outpatient Age: 62 Room: [facility] ENDO ROOM 4 Gender: Female Note Status: Finalized Instrument Name: Nelda Marseille [id number] Procedure:             Colonoscopy Indications:           Screening for colorectal malignant neoplasm Providers:             Midge Minium MD, MD Referring MD:          [name]. Pender (Referring MD) Medicines:             Propofol per Anesthesia Complications:         No immediate complications. Procedure:             Pre-Anesthesia Assessment:                        - Prior to the procedure, a History and Physical was                         performed, and patient medications and allergies were                         reviewed. The patient's tolerance of previous                         anesthesia was also reviewed. The risks and benefits                         of the procedure and the sedation options and risks                         were discussed with the patient. All questions were                         answered, and informed consent was obtained. Prior                         Anticoagulants: The patient has taken no anticoagulant                         or antiplatelet agents. ASA Grade Assessment: II - A                         patient with mild systemic disease. After reviewing                         the risks and benefits, the patient was deemed in                         satisfactory condition to undergo the procedure.                        After obtaining informed consent, the colonoscope was                         passed under direct vision. Throughout the procedure,  the patient's blood pressure, pulse, and oxygen                         saturations were monitored continuously. The                         Colonoscope was introduced through  the anus and                         advanced to the the cecum, identified by appendiceal                         orifice and ileocecal valve. The colonoscopy was                         performed without difficulty. The patient tolerated                         the procedure well. The quality of the bowel                         preparation was excellent. Findings:      The perianal and digital rectal examinations were normal.      Non-bleeding internal hemorrhoids were found during retroflexion. The       hemorrhoids were Grade I (internal hemorrhoids that do not prolapse). Impression:            - Non-bleeding internal hemorrhoids.                        - No specimens collected. Recommendation:        - Discharge patient to home.                        - Resume previous diet.                        - Repeat colonoscopy in 10 years for screening                         purposes. Procedure Code(s):     --- Professional ---                        [phone], Colonoscopy, flexible; diagnostic, including                         collection of specimen(s) by brushing or washing, when                         performed (separate procedure) Diagnosis Code(s):     --- Professional ---                        Z12.11, Encounter for screening for malignant neoplasm                         of colon CPT copyright 2022 American Medical Association. All rights reserved. The codes documented in this report are preliminary and upon coder review may  be revised to meet current compliance requirements. Midge Minium MD, MD 05/22/2023 8:58:15 AM  This report has been signed electronically. Number of Addenda: 0 Note Initiated On: 05/22/2023 8:23 AM Scope Withdrawal Time: 0 hours 8 minutes 27 seconds  Total Procedure Duration: 0 hours 13 minutes 24 seconds  Estimated Blood Loss:  Estimated blood loss: none.      [facility]

## 2023-05-22 NOTE — Anesthesia Postprocedure Evaluation (Signed)
Anesthesia Post Note  Patient: Elizabeth Scott  Procedure(s) Performed: COLONOSCOPY WITH PROPOFOL  Patient location during evaluation: Endoscopy Anesthesia Type: General Level of consciousness: awake and alert Pain management: pain level controlled Vital Signs Assessment: post-procedure vital signs reviewed and stable Respiratory status: spontaneous breathing, nonlabored ventilation, respiratory function stable and patient connected to nasal cannula oxygen Cardiovascular status: blood pressure returned to baseline and stable Postop Assessment: no apparent nausea or vomiting Anesthetic complications: no   No notable events documented.   Last Vitals:  Vitals:   05/22/23 0911 05/22/23 0921  BP: (!) 146/62 (!) 158/65  Pulse: 64 63  Resp: 13 14  Temp:    SpO2: 100% 100%    Last Pain:  Vitals:   05/22/23 0921  TempSrc:   PainSc: 0-No pain                 Corinda Gubler

## 2023-05-22 NOTE — Anesthesia Preprocedure Evaluation (Signed)
Anesthesia Evaluation  Patient identified by MRN, date of birth, ID band Patient awake    Reviewed: Allergy & Precautions, NPO status , Patient's Chart, lab work & pertinent test results  History of Anesthesia Complications Negative for: history of anesthetic complications  Airway Mallampati: II  TM Distance: >3 FB Neck ROM: Full    Dental no notable dental hx. (+) Teeth Intact   Pulmonary neg pulmonary ROS, neg sleep apnea, neg COPD, Patient abstained from smoking.Not current smoker   Pulmonary exam normal breath sounds clear to auscultation       Cardiovascular Exercise Tolerance: Good METShypertension, Pt. on medications (-) CAD and (-) Past MI (-) dysrhythmias  Rhythm:Regular Rate:Normal - Systolic murmurs Per cardiology, patient deemed optimized for procedure from anticoagulation/CVA standpoint   Neuro/Psych CVA, No Residual Symptoms  negative psych ROS   GI/Hepatic ,GERD  ,,(+)     (-) substance abuse    Endo/Other  diabetes  GLP1 agonist held for over 7 days  Renal/GU negative Renal ROS     Musculoskeletal   Abdominal  (+) + obese  Peds  Hematology   Anesthesia Other Findings Past Medical History: No date: Allergy No date: Chronic sinusitis No date: COVID-19 No date: Diabetes mellitus without complication (HCC) No date: GERD (gastroesophageal reflux disease) No date: High cholesterol No date: History of abnormal cervical Pap smear     Comment:  Prior to Hysterectomy, normal paps for 2-3 years after               Hysterectomy No date: History of gestational diabetes No date: Hypertension No date: Hypopotassemia No date: Microcytosis  Reproductive/Obstetrics                              Anesthesia Physical Anesthesia Plan  ASA: 3  Anesthesia Plan: General   Post-op Pain Management: Minimal or no pain anticipated   Induction: Intravenous  PONV Risk Score and Plan: 3  and Propofol infusion, TIVA and Ondansetron  Airway Management Planned: Nasal Cannula  Additional Equipment: None  Intra-op Plan:   Post-operative Plan:   Informed Consent: I have reviewed the patients History and Physical, chart, labs and discussed the procedure including the risks, benefits and alternatives for the proposed anesthesia with the patient or authorized representative who has indicated his/her understanding and acceptance.     Dental advisory given  Plan Discussed with: CRNA and Surgeon  Anesthesia Plan Comments: (Discussed risks of anesthesia with patient, including possibility of difficulty with spontaneous ventilation under anesthesia necessitating airway intervention, PONV, and rare risks such as cardiac or respiratory or neurological events, and allergic reactions. Discussed the role of CRNA in patient's perioperative care. Patient understands.)         Anesthesia Quick Evaluation

## 2023-05-22 NOTE — H&P (Signed)
Elizabeth Minium, MD [facility] [address] [place], [place] [address] Phone: [phone] Fax : [phone]  Primary Care Physician:  Berniece Salines, [name] Primary Gastroenterologist:  Dr. Servando Snare  Pre-Procedure History & Physical: HPI:  Shauniece Scott is a 62 y.o. female is here for a screening colonoscopy.   Past Medical History:  Diagnosis Date   Allergy    Chronic sinusitis    COVID-19    Diabetes mellitus without complication (HCC)    GERD (gastroesophageal reflux disease)    High cholesterol    History of abnormal cervical Pap smear    Prior to Hysterectomy, normal paps for 2-3 years after Hysterectomy   History of gestational diabetes    Hypertension    Hypopotassemia    Microcytosis     Past Surgical History:  Procedure Laterality Date   ABDOMINAL HYSTERECTOMY  2003   still has ovaries-for menorrhagia   CESAREAN SECTION     COLONOSCOPY  02/22/14   repeat 5 years   ESOPHAGOGASTRODUODENOSCOPY  02/22/14   TUBAL LIGATION      Prior to Admission medications   Medication Sig Start Date End Date Taking? Authorizing Provider  amLODipine (NORVASC) 5 MG tablet TAKE 1 TABLET (5 MG TOTAL) BY MOUTH DAILY. 05/14/23  Yes Berniece Salines, [name]  aspirin EC 81 MG tablet Take 81 mg by mouth daily. 08/23/21  Yes [provider]  atorvastatin (LIPITOR) 80 MG tablet TAKE 1 TABLET BY MOUTH EVERY DAY 05/03/23  Yes Berniece Salines, [name]  ezetimibe (ZETIA) 10 MG tablet TAKE 1 TABLET BY MOUTH EVERY DAY 05/11/23  Yes Berniece Salines, [name]  glipiZIDE (GLUCOTROL XL) 10 MG 24 hr tablet TAKE 1 TABLET BY MOUTH TWICE A DAY 05/01/23  Yes Berniece Salines, [name]  losartan (COZAAR) 50 MG tablet TAKE 1 TABLET BY MOUTH EVERY DAY 05/14/23  Yes Berniece Salines, [name]  pantoprazole (PROTONIX) 40 MG tablet Take 1 tablet (40 mg total) by mouth daily. 03/16/23  Yes Berniece Salines, [name]  pioglitazone (ACTOS) 15 MG tablet TAKE 1 TABLET (15 MG TOTAL) BY MOUTH DAILY. 04/30/23  Yes Berniece Salines, [name]   sitaGLIPtin (JANUVIA) 25 MG tablet Take 25 mg by mouth daily.   Yes [provider]  albuterol (VENTOLIN HFA) 108 (90 Base) MCG/ACT inhaler Inhale 1-2 puffs into the lungs every 6 (six) hours as needed. 04/19/23   Margaretann Loveless, [name]  benzonatate (TESSALON) 100 MG capsule Take 1 capsule (100 mg total) by mouth 3 (three) times daily as needed. 04/19/23   Margaretann Loveless, [name]  clopidogrel (PLAVIX) 75 MG tablet Take 1 tablet (75 mg total) by mouth every evening. 12/08/22   Alford Highland, MD  fluticasone (FLONASE) 50 MCG/ACT nasal spray Place 2 sprays into both nostrils daily. 04/19/23   Margaretann Loveless, [name]  methocarbamol (ROBAXIN) 500 MG tablet TAKE 1 TABLET (500 MG TOTAL) BY MOUTH 2 (TWO) TIMES DAILY AS NEEDED FOR MUSCLE SPASMS. 03/28/23   Berniece Salines, [name]  promethazine-dextromethorphan (PROMETHAZINE-DM) 6.25-15 MG/5ML syrup Take 5 mLs by mouth 4 (four) times daily as needed. 04/19/23   Margaretann Loveless, [name]  Semaglutide,0.25 or 0.5MG /DOS, (OZEMPIC, 0.25 OR 0.5 MG/DOSE,) 2 MG/3ML SOPN INJECT 0.25 MG UNDER THE SKIN ONE TIME PER WEEK 05/14/23   Berniece Salines, [name]    Allergies as of 03/23/2023 - Review Complete 03/16/2023  Allergen Reaction Noted   Metformin and related Other (See Comments) 01/04/2015   Other  10/17/2017  Family History  Problem Relation Age of Onset   Diabetes Mother    Heart disease Mother    Hypertension Mother    Stroke Mother    Diabetes Father    Hypertension Father    Asthma Sister    Diabetes Sister    Hypertension Sister    Lung disease Sister    Hypertension Brother    Heart disease Brother    Stroke Brother    Asthma Sister    Diabetes Sister    Hypertension Sister    Congestive Heart Failure Maternal Grandmother    Cancer Paternal Grandfather        stomach cancer   COPD Neg Hx     Social History   Socioeconomic History   Marital status: Married    Spouse name: Not on file   Number of children: Not on file    Years of education: Not on file   Highest education level: Not on file  Occupational History   Not on file  Tobacco Use   Smoking status: Never   Smokeless tobacco: Never  Vaping Use   Vaping status: Never Used  Substance and Sexual Activity   Alcohol use: No   Drug use: No   Sexual activity: Yes  Other Topics Concern   Not on file  Social History Narrative   Not on file   Social Determinants of Health   Financial Resource Strain: Low Risk  (11/07/2022)   Overall Financial Resource Strain (CARDIA)    Difficulty of Paying Living Expenses: Not hard at all  Food Insecurity: No Food Insecurity (12/08/2022)   Hunger Vital Sign    Worried About Running Out of Food in the Last Year: Never true    Ran Out of Food in the Last Year: Never true  Transportation Needs: No Transportation Needs (12/08/2022)   PRAPARE - Administrator, Civil Service (Medical): No    Lack of Transportation (Non-Medical): No  Physical Activity: Inactive (11/07/2022)   Exercise Vital Sign    Days of Exercise per Week: 0 days    Minutes of Exercise per Session: 0 min  Stress: No Stress Concern Present (11/07/2022)   [employer] of Occupational Health - Occupational Stress Questionnaire    Feeling of Stress : Only a little  Social Connections: Socially Integrated (11/07/2022)   Social Connection and Isolation Panel [NHANES]    Frequency of Communication with Friends and Family: More than three times a week    Frequency of Social Gatherings with Friends and Family: More than three times a week    Attends Religious Services: More than 4 times per year    Active Member of [employer] or Organizations: No    Attends Engineer, structural: More than 4 times per year    Marital Status: Married  Catering manager Violence: Not At Risk (12/08/2022)   Humiliation, Afraid, Rape, and Kick questionnaire    Fear of Current or Ex-Partner: No    Emotionally Abused: No    Physically Abused: No    Sexually  Abused: No    Review of Systems: See HPI, otherwise negative ROS  Physical Exam: BP (!) 240/87   Pulse 74   Temp (!) 96.2 F (35.7 C) (Temporal)   Resp 18   Ht 5\' 3"  (1.6 m)   Wt 102 kg   SpO2 100%   BMI 39.83 kg/m  General:   Alert,  pleasant and cooperative in NAD Head:  Normocephalic and atraumatic. Neck:  Supple; no masses or thyromegaly. Lungs:  Clear throughout to auscultation.    Heart:  Regular rate and rhythm. Abdomen:  Soft, nontender and nondistended. Normal bowel sounds, without guarding, and without rebound.   Neurologic:  Alert and  oriented x4;  grossly normal neurologically.  Impression/Plan: Elizabeth Scott is now here to undergo a screening colonoscopy.  Risks, benefits, and alternatives regarding colonoscopy have been reviewed with the patient.  Questions have been answered.  All parties agreeable.

## 2023-05-23 ENCOUNTER — Encounter: Payer: Self-pay | Admitting: Gastroenterology

## 2023-05-28 ENCOUNTER — Ambulatory Visit (INDEPENDENT_AMBULATORY_CARE_PROVIDER_SITE_OTHER): Payer: No Typology Code available for payment source

## 2023-05-28 DIAGNOSIS — I639 Cerebral infarction, unspecified: Secondary | ICD-10-CM

## 2023-05-28 LAB — CUP PACEART REMOTE DEVICE CHECK
Date Time Interrogation Session: 20241027231728
Implantable Pulse Generator Implant Date: 20240815

## 2023-06-03 ENCOUNTER — Other Ambulatory Visit: Payer: Self-pay | Admitting: Nurse Practitioner

## 2023-06-03 DIAGNOSIS — E785 Hyperlipidemia, unspecified: Secondary | ICD-10-CM

## 2023-06-05 NOTE — Telephone Encounter (Signed)
Requested medication (s) are due for refill today: yes  Requested medication (s) are on the active medication list: yes  Last refill:  05/11/23 #30 0 refills  Future visit scheduled: yes in 2 weeks  Notes to clinic:  do you want to give another courtesy refill until appt? Or Pharmacy comment: REQUEST FOR 90 DAYS PRESCRIPTION. DX Code Needed.       Requested Prescriptions  Pending Prescriptions Disp Refills   ezetimibe (ZETIA) 10 MG tablet [Pharmacy Med Name: EZETIMIBE 10 MG TABLET] 90 tablet 1    Sig: TAKE 1 TABLET BY MOUTH EVERY DAY (PLEASE KEEP SCHEDULED APPT FOR ADDITIONAL REFILLS)     Cardiovascular:  Antilipid - Sterol Transport Inhibitors Failed - 06/03/2023 12:32 PM      Failed - Lipid Panel in normal range within the last 12 months    Cholesterol, Total  Date Value Ref Range Status  08/30/2015 151 100 - 199 mg/dL Final   Cholesterol  Date Value Ref Range Status  12/08/2022 139 0 - 200 mg/dL Final   LDL Cholesterol (Calc)  Date Value Ref Range Status  11/07/2022 157 (H) mg/dL (calc) Final    Comment:    Reference range: <100 . Desirable range <100 mg/dL for primary prevention;   <70 mg/dL for patients with CHD or diabetic patients  with > or = 2 CHD risk factors. Marland Kitchen LDL-C is now calculated using the Martin-Hopkins  calculation, which is a validated novel method providing  better accuracy than the Friedewald equation in the  estimation of LDL-C.  Horald Pollen et al. Lenox Ahr. 2536;644(03): 2061-2068  (http://education.[employer].com/faq/FAQ164)    LDL Cholesterol  Date Value Ref Range Status  12/08/2022 78 0 - 99 mg/dL Final    Comment:           Total Cholesterol/HDL:CHD Risk Coronary Heart Disease Risk Table                     Men   Women  1/2 Average Risk   3.4   3.3  Average Risk       5.0   4.4  2 X Average Risk   9.6   7.1  3 X Average Risk  23.4   11.0        Use the calculated Patient Ratio above and the CHD Risk Table to determine the  patient's CHD Risk.        ATP III CLASSIFICATION (LDL):  <100     mg/dL   Optimal  474-259  mg/dL   Near or Above                    Optimal  130-159  mg/dL   Borderline  563-875  mg/dL   High  >643     mg/dL   Very High Performed at [facility], [address] [place], [place] [address]    HDL  Date Value Ref Range Status  12/08/2022 39 (L) >40 mg/dL Final  88/41/6606 41 >30 mg/dL Final   Triglycerides  Date Value Ref Range Status  12/08/2022 111 <150 mg/dL Final         Passed - AST in normal range and within 360 days    AST  Date Value Ref Range Status  03/16/2023 14 10 - 35 U/L Final         Passed - ALT in normal range and within 360 days    ALT  Date Value Ref Range Status  03/16/2023  12 6 - 29 U/L Final         Passed - Patient is not pregnant      Passed - Valid encounter within last 12 months    Recent Outpatient Visits           2 months ago Uncontrolled type 2 diabetes mellitus with hyperglycemia, without long-term current use of insulin [facility])   [facility] Berniece Salines, [name]   5 months ago History of stroke   [facility] Della Goo F, [name]   6 months ago Acute nonintractable headache, unspecified headache type   [facility] Berniece Salines, [name]   6 months ago Type 2 diabetes mellitus without complication, without long-term current use of insulin [facility])   [place] [facility] Mecum, [name], [name]   7 months ago Annual physical exam   [facility] Berniece Salines, [name]       Future Appointments             In 2 weeks [name], [name], [name] [facility], [name]   In 5 months [name], [name], [name] [facility], [facility]

## 2023-06-13 ENCOUNTER — Other Ambulatory Visit: Payer: Self-pay | Admitting: Nurse Practitioner

## 2023-06-13 DIAGNOSIS — K219 Gastro-esophageal reflux disease without esophagitis: Secondary | ICD-10-CM

## 2023-06-14 NOTE — Telephone Encounter (Signed)
Request is too soon for refill, last refill 03/16/23 for 90 and 1 refill.  Requested Prescriptions  Pending Prescriptions Disp Refills   pantoprazole (PROTONIX) 40 MG tablet [Pharmacy Med Name: PANTOPRAZOLE SOD DR 40 MG TAB] 90 tablet 1    Sig: TAKE 1 TABLET BY MOUTH EVERY DAY     Gastroenterology: Proton Pump Inhibitors Passed - 06/13/2023  9:18 AM      Passed - Valid encounter within last 12 months    Recent Outpatient Visits           3 months ago Uncontrolled type 2 diabetes mellitus with hyperglycemia, without long-term current use of insulin [facility])   [place] Berniece Salines, [name]   6 months ago History of stroke   [facility] Della Goo F, [name]   6 months ago Acute nonintractable headache, unspecified headache type   [facility] Della Goo F, [name]   6 months ago Type 2 diabetes mellitus without complication, without long-term current use of insulin [facility])   [facility] Mecum, [name], [name]   7 months ago Annual physical exam   [facility] Berniece Salines, [name]       Future Appointments             In 5 days [name], [name], [name] [facility], [name]   In 4 months [name], [name], [name] [facility], [facility]

## 2023-06-18 NOTE — Progress Notes (Signed)
Carelink Summary Report / Loop Recorder 

## 2023-06-19 ENCOUNTER — Ambulatory Visit: Payer: BC Managed Care – PPO | Admitting: Nurse Practitioner

## 2023-06-22 ENCOUNTER — Other Ambulatory Visit: Payer: Self-pay

## 2023-06-22 ENCOUNTER — Ambulatory Visit: Payer: No Typology Code available for payment source | Admitting: Nurse Practitioner

## 2023-06-22 ENCOUNTER — Encounter: Payer: Self-pay | Admitting: Nurse Practitioner

## 2023-06-22 VITALS — BP 132/84 | HR 78 | Temp 97.9°F | Resp 16 | Ht 63.0 in | Wt 225.8 lb

## 2023-06-22 DIAGNOSIS — K219 Gastro-esophageal reflux disease without esophagitis: Secondary | ICD-10-CM | POA: Diagnosis not present

## 2023-06-22 DIAGNOSIS — E785 Hyperlipidemia, unspecified: Secondary | ICD-10-CM | POA: Diagnosis not present

## 2023-06-22 DIAGNOSIS — Z794 Long term (current) use of insulin: Secondary | ICD-10-CM

## 2023-06-22 DIAGNOSIS — Z7985 Long-term (current) use of injectable non-insulin antidiabetic drugs: Secondary | ICD-10-CM

## 2023-06-22 DIAGNOSIS — E1149 Type 2 diabetes mellitus with other diabetic neurological complication: Secondary | ICD-10-CM | POA: Diagnosis not present

## 2023-06-22 DIAGNOSIS — J069 Acute upper respiratory infection, unspecified: Secondary | ICD-10-CM

## 2023-06-22 DIAGNOSIS — Z6837 Body mass index (BMI) 37.0-37.9, adult: Secondary | ICD-10-CM

## 2023-06-22 DIAGNOSIS — Z7984 Long term (current) use of oral hypoglycemic drugs: Secondary | ICD-10-CM

## 2023-06-22 DIAGNOSIS — E66812 Obesity, class 2: Secondary | ICD-10-CM

## 2023-06-22 DIAGNOSIS — E782 Mixed hyperlipidemia: Secondary | ICD-10-CM

## 2023-06-22 DIAGNOSIS — I10 Essential (primary) hypertension: Secondary | ICD-10-CM | POA: Diagnosis not present

## 2023-06-22 DIAGNOSIS — Z8673 Personal history of transient ischemic attack (TIA), and cerebral infarction without residual deficits: Secondary | ICD-10-CM

## 2023-06-22 LAB — POCT GLYCOSYLATED HEMOGLOBIN (HGB A1C): Hemoglobin A1C: 7 % — AB (ref 4.0–5.6)

## 2023-06-22 MED ORDER — OZEMPIC (0.25 OR 0.5 MG/DOSE) 2 MG/3ML ~~LOC~~ SOPN: 0.5000 mg | PEN_INJECTOR | SUBCUTANEOUS | 0 refills | Status: DC

## 2023-06-22 MED ORDER — SITAGLIPTIN PHOSPHATE 25 MG PO TABS: 25.0000 mg | ORAL_TABLET | Freq: Every day | ORAL | 1 refills | Status: DC

## 2023-06-22 MED ORDER — PIOGLITAZONE HCL 15 MG PO TABS: 15.0000 mg | ORAL_TABLET | Freq: Every day | ORAL | 1 refills | Status: DC

## 2023-06-22 NOTE — Assessment & Plan Note (Signed)
DM with history of CVA

## 2023-06-22 NOTE — Progress Notes (Signed)
BP 132/84   Pulse 78   Temp 97.9 F (36.6 C) (Oral)   Resp 16   Ht 5\' 3"  (1.6 m)   Wt 225 lb 12.8 oz (102.4 kg)   SpO2 97%   BMI 40.00 kg/m    Subjective:    Patient ID: Elizabeth Scott, female    DOB: July 31, 1961, 62 y.o.   MRN: [id number]  HPI: Elizabeth Scott is a 62 y.o. female  Chief Complaint  Patient presents with   Medical Management of Chronic Issues   Discussed the use of AI scribe software for clinical note transcription with the patient, who gave verbal consent to proceed.  History of Present Illness   The patient, with a history of diabetes, hypertension, hyperlipidemia, stroke, and obesity, presents for a routine follow-up. The patient's last A1c was 6.5, but it has since increased to 7.0. The patient is currently on glipizide 10 mg daily, Actos 15 mg daily, Januvia 25 mg daily, and Ozempic weekly for diabetes management.  For hypertension, the patient is taking amlodipine 5 mg daily and losartan 50 mg daily. The patient's blood pressure was slightly elevated at 132/84 during the visit. The patient has been experiencing a cough and runny nose, which she attributes to a potential cold. She has tested negative for COVID-19.  The patient has a history of stroke and is currently on aspirin 81 mg daily and Plavix 75 mg daily. She reports some residual effects from the stroke, particularly affecting her vision. She has a loop recorder implanted for cardiac monitoring.  The patient also has a history of obesity and is currently weighing 225 pounds with comorbidities of htn, dm, hld, cva.  She reports no issues with acid reflux and is taking Protonix 40 mg daily for management.       06/22/2023   11:20 AM 05/22/2023    9:21 AM 05/22/2023    9:11 AM  Vitals with BMI  Height 5\' 3"     Weight 225 lbs 13 oz    BMI 40.01    Systolic 132 158 409  Diastolic 84 65 62  Pulse 78 63 64       06/22/2023   11:21 AM 03/16/2023    1:45 PM 12/15/2022   12:54 PM  12/07/2022   10:01 AM 12/04/2022    9:58 AM  Depression screen PHQ 2/9  Decreased Interest 0 0 0 0 0  Down, Depressed, Hopeless 0 0 0 0 0  PHQ - 2 Score 0 0 0 0 0  Altered sleeping     0  Tired, decreased energy     0  Change in appetite     0  Feeling bad or failure about yourself      0  Trouble concentrating     0  Moving slowly or fidgety/restless     0  Suicidal thoughts     0  PHQ-9 Score     0  Difficult doing work/chores     Not difficult at all    Relevant past medical, surgical, family and social history reviewed and updated as indicated. Interim medical history since our last visit reviewed. Allergies and medications reviewed and updated.  Review of Systems  Constitutional: Negative for fever or weight change.  Respiratory: positive for cough and negative for shortness of breath.   Cardiovascular: Negative for chest pain or palpitations.  Gastrointestinal: Negative for abdominal pain, no bowel changes.  Musculoskeletal: Negative for gait problem or joint swelling.  Skin:  Negative for rash.  Neurological: Negative for dizziness or headache.  No other specific complaints in a complete review of systems (except as listed in HPI above).      Objective:    BP 132/84   Pulse 78   Temp 97.9 F (36.6 C) (Oral)   Resp 16   Ht 5\' 3"  (1.6 m)   Wt 225 lb 12.8 oz (102.4 kg)   SpO2 97%   BMI 40.00 kg/m   Wt Readings from Last 3 Encounters:  06/22/23 225 lb 12.8 oz (102.4 kg)  05/22/23 224 lb 13.9 oz (102 kg)  03/16/23 229 lb 3.2 oz (104 kg)    Physical Exam  Constitutional: Patient appears well-developed and well-nourished. Obese  No distress.  HEENT: head atraumatic, normocephalic, pupils equal and reactive to light, neck supple Cardiovascular: Normal rate, regular rhythm and normal heart sounds.  No murmur heard. No BLE edema. Pulmonary/Chest: Effort normal and breath sounds normal. No respiratory distress. Abdominal: Soft.  There is no tenderness. Psychiatric:  Patient has a normal mood and affect. behavior is normal. Judgment and thought content normal.  Results for orders placed or performed in visit on 06/22/23  POCT glycosylated hemoglobin (Hb A1C)  Result Value Ref Range   Hemoglobin A1C 7.0 (A) 4.0 - 5.6 %   HbA1c POC (<> result, manual entry)     HbA1c, POC (prediabetic range)     HbA1c, POC (controlled diabetic range)        Assessment & Plan:   Problem List Items Addressed This Visit       Cardiovascular and Mediastinum   Essential hypertension, benign - Primary (Chronic)     Digestive   Gastroesophageal reflux disease without esophagitis     Endocrine   Type 2 diabetes mellitus without complication, without long-term current use of insulin (HCC)    DM with history of CVA      Relevant Medications   sitaGLIPtin (JANUVIA) 25 MG tablet   Semaglutide,0.25 or 0.5MG /DOS, (OZEMPIC, 0.25 OR 0.5 MG/DOSE,) 2 MG/3ML SOPN   pioglitazone (ACTOS) 15 MG tablet     Other   Hyperlipidemia   Class 2 severe obesity with serious comorbidity and body mass index (BMI) of 37.0 to 37.9 in adult [facility])   Relevant Medications   sitaGLIPtin (JANUVIA) 25 MG tablet   Semaglutide,0.25 or 0.5MG /DOS, (OZEMPIC, 0.25 OR 0.5 MG/DOSE,) 2 MG/3ML SOPN   pioglitazone (ACTOS) 15 MG tablet   History of stroke    Assessment and Plan    Type 2 Diabetes Mellitus A1c increased from 6.5 to 7.0. Currently on Glipizide 10mg  daily, Actos 15mg  daily, Januvia 25mg  daily, and Ozempic 0.25mg  weekly. -Increase Ozempic to 0.5mg  weekly.  Hypertension Blood pressure slightly elevated at 132/84. Currently on Amlodipine 5mg  daily and Losartan 50mg  daily. Patient reports possible symptoms of a cold which may be contributing to elevated blood pressure. -Monitor blood pressure. If consistently elevated, consider medication adjustment.  Hyperlipidemia Currently on Zetia 10mg  daily and Atorvastatin 80mg  daily. -Continue current regimen.  History of Stroke Patient reports  some residual vision issues in the left eye, currently being managed with regular follow-ups and corrective glasses. Currently on Aspirin 81mg  daily and Plavix 75mg  daily. -Continue current regimen and follow-up with cardiology and neurology as needed.  Obesity with comorbidities Previous weight was 229 pounds. No current weight provided. -Encourage continued efforts towards weight loss and healthy lifestyle.  Gastroesophageal Reflux Disease Well controlled on Protonix 40mg  daily. -Continue Protonix 40mg  daily.  Upper Respiratory Symptoms Patient  reports recent onset of cough and runny nose. COVID test negative. -Advise over-the-counter Zyrtec, Mucinex, and Flonase. Encourage hydration.  General Health Maintenance -Refill Januvia and Actos as needed. -Check Vitamin D level at next visit. In the meantime, advise Vitamin D supplementation up to 2000 IU daily, along with Vitamin C, Vitamin B12, and Zinc for overall health and energy. -Follow-up after Thanksgiving holiday.        Follow up plan: Return in about 3 months (around 09/22/2023) for follow up.

## 2023-07-01 LAB — CUP PACEART REMOTE DEVICE CHECK
Date Time Interrogation Session: 20241129230857
Implantable Pulse Generator Implant Date: 20240815

## 2023-07-02 ENCOUNTER — Ambulatory Visit (INDEPENDENT_AMBULATORY_CARE_PROVIDER_SITE_OTHER): Payer: No Typology Code available for payment source

## 2023-07-02 DIAGNOSIS — I639 Cerebral infarction, unspecified: Secondary | ICD-10-CM | POA: Diagnosis not present

## 2023-07-30 ENCOUNTER — Other Ambulatory Visit: Payer: Self-pay | Admitting: Nurse Practitioner

## 2023-07-30 DIAGNOSIS — K219 Gastro-esophageal reflux disease without esophagitis: Secondary | ICD-10-CM

## 2023-08-03 NOTE — Telephone Encounter (Signed)
  Pharmacy comment: Alternative Requested:NEED TO BE A 90 DAY SUPPLY. INSURANCE REQ.

## 2023-08-06 ENCOUNTER — Ambulatory Visit (INDEPENDENT_AMBULATORY_CARE_PROVIDER_SITE_OTHER): Payer: No Typology Code available for payment source

## 2023-08-06 DIAGNOSIS — I639 Cerebral infarction, unspecified: Secondary | ICD-10-CM

## 2023-08-08 LAB — CUP PACEART REMOTE DEVICE CHECK
Date Time Interrogation Session: 20250105231831
Implantable Pulse Generator Implant Date: 20240815

## 2023-08-11 ENCOUNTER — Other Ambulatory Visit: Payer: Self-pay | Admitting: Nurse Practitioner

## 2023-08-11 DIAGNOSIS — E119 Type 2 diabetes mellitus without complications: Secondary | ICD-10-CM

## 2023-08-11 DIAGNOSIS — I10 Essential (primary) hypertension: Secondary | ICD-10-CM

## 2023-08-14 NOTE — Telephone Encounter (Signed)
 Requested Prescriptions  Pending Prescriptions Disp Refills   losartan  (COZAAR ) 50 MG tablet [Pharmacy Med Name: LOSARTAN  POTASSIUM 50 MG TAB] 90 tablet 1    Sig: TAKE 1 TABLET BY MOUTH EVERY DAY     Cardiovascular:  Angiotensin Receptor Blockers Passed - 08/14/2023  7:57 AM      Passed - Cr in normal range and within 180 days    Creat  Date Value Ref Range Status  03/16/2023 0.67 0.50 - 1.05 mg/dL Final   Creatinine, Urine  Date Value Ref Range Status  11/07/2022 118 20 - 275 mg/dL Final         Passed - K in normal range and within 180 days    Potassium  Date Value Ref Range Status  03/16/2023 4.1 3.5 - 5.3 mmol/L Final         Passed - Patient is not pregnant      Passed - Last BP in normal range    BP Readings from Last 1 Encounters:  06/22/23 132/84         Passed - Valid encounter within last 6 months    Recent Outpatient Visits           1 month ago Essential hypertension, benign   [facility] Gareth Mliss FALCON, [name]   5 months ago Uncontrolled type 2 diabetes mellitus with hyperglycemia, without long-term current use of insulin  [facility])   [facility] Gareth Mliss FALCON, [name]   8 months ago History of stroke   [facility] Gareth Mliss F, [name]   8 months ago Acute nonintractable headache, unspecified headache type   [facility] Gareth Mliss F, [name]   8 months ago Type 2 diabetes mellitus without complication, without long-term current use of insulin  [facility])   [facility] Mecum, Rocky BRAVO, [name]       Future Appointments             In 2 months Gareth, Mliss FALCON, [name] [facility], [name]   In 2 months Gareth, Mliss FALCON, [name] [facility], [facility]

## 2023-08-17 ENCOUNTER — Other Ambulatory Visit: Payer: Self-pay | Admitting: Nurse Practitioner

## 2023-08-17 DIAGNOSIS — E119 Type 2 diabetes mellitus without complications: Secondary | ICD-10-CM

## 2023-08-17 DIAGNOSIS — I10 Essential (primary) hypertension: Secondary | ICD-10-CM

## 2023-08-17 NOTE — Telephone Encounter (Signed)
Medication Refill -  Most Recent Primary Care Visit:  Provider: Della Goo F  Department: [facility]  Visit Type: OFFICE VISIT  Date: 06/22/2023  Medication: losartan (COZAAR) 50 MG tablet   Has the patient contacted their pharmacy? No She says takes forever for them to do refill, I let her know she should still call them for refills   Is this the correct pharmacy for this prescription? yes  This is the patient's preferred pharmacy:  [id number] - [place], [place] - [address] AT [facility] [address] [place] [address] Phone: [phone] Fax: [phone]   Has the prescription been filled recently? no  Is the patient out of the medication? yes  Has the patient been seen for an appointment in the last year OR does the patient have an upcoming appointment? yes  Can we respond through MyChart? yes  Agent: Please be advised that Rx refills may take up to 3 business days. We ask that you follow-up with your pharmacy.

## 2023-08-17 NOTE — Telephone Encounter (Signed)
Requested Prescriptions  Refused Prescriptions Disp Refills   losartan (COZAAR) 50 MG tablet 90 tablet 1    Sig: Take 1 tablet (50 mg total) by mouth daily.     Cardiovascular:  Angiotensin Receptor Blockers Passed - 08/17/2023  3:09 PM      Passed - Cr in normal range and within 180 days    Creat  Date Value Ref Range Status  03/16/2023 0.67 0.50 - 1.05 mg/dL Final   Creatinine, Urine  Date Value Ref Range Status  11/07/2022 118 20 - 275 mg/dL Final         Passed - K in normal range and within 180 days    Potassium  Date Value Ref Range Status  03/16/2023 4.1 3.5 - 5.3 mmol/L Final         Passed - Patient is not pregnant      Passed - Last BP in normal range    BP Readings from Last 1 Encounters:  06/22/23 132/84         Passed - Valid encounter within last 6 months    Recent Outpatient Visits           1 month ago Essential hypertension, benign   [facility] Della Goo F, [name]   5 months ago Uncontrolled type 2 diabetes mellitus with hyperglycemia, without long-term current use of insulin [facility])   [facility] Berniece Salines, [name]   8 months ago History of stroke   [facility] Della Goo F, [name]   8 months ago Acute nonintractable headache, unspecified headache type   [facility] Della Goo F, [name]   8 months ago Type 2 diabetes mellitus without complication, without long-term current use of insulin [facility])   [facility] Mecum, [name], [name]       Future Appointments             In 2 months [name], [name], [name] [facility], [name]   In 2 months [name], [name], [name] [facility], [facility]

## 2023-08-18 ENCOUNTER — Other Ambulatory Visit: Payer: Self-pay | Admitting: Nurse Practitioner

## 2023-08-18 DIAGNOSIS — K219 Gastro-esophageal reflux disease without esophagitis: Secondary | ICD-10-CM

## 2023-08-20 NOTE — Telephone Encounter (Signed)
Refilled 08/03/23 # 90 with 1 refill. Requested Prescriptions  Refused Prescriptions Disp Refills   pantoprazole (PROTONIX) 40 MG tablet [Pharmacy Med Name: PANTOPRAZOLE SOD DR 40 MG TAB] 30 tablet     Sig: TAKE 1 TABLET BY MOUTH DAILY     Gastroenterology: Proton Pump Inhibitors Passed - 08/20/2023 11:43 AM      Passed - Valid encounter within last 12 months    Recent Outpatient Visits           1 month ago Essential hypertension, benign   [facility] Della Goo F, [name]   5 months ago Uncontrolled type 2 diabetes mellitus with hyperglycemia, without long-term current use of insulin [facility])   [facility] Berniece Salines, [name]   8 months ago History of stroke   [facility] Della Goo F, [name]   8 months ago Acute nonintractable headache, unspecified headache type   [facility] Della Goo F, [name]   8 months ago Type 2 diabetes mellitus without complication, without long-term current use of insulin [facility])   [place] [facility] Mecum, [name], [name]       Future Appointments             In 2 months [name], [name], [name] [facility], [name]   In 2 months [name], [name], [name] [facility], [facility]

## 2023-08-24 ENCOUNTER — Other Ambulatory Visit: Payer: Self-pay | Admitting: Nurse Practitioner

## 2023-08-24 DIAGNOSIS — K219 Gastro-esophageal reflux disease without esophagitis: Secondary | ICD-10-CM

## 2023-08-24 NOTE — Telephone Encounter (Signed)
Requested Prescriptions  Refused Prescriptions Disp Refills   pantoprazole (PROTONIX) 40 MG tablet [Pharmacy Med Name: PANTOPRAZOLE SOD DR 40 MG TAB] 90 tablet 1    Sig: TAKE 1 TABLET BY MOUTH EVERY DAY     Gastroenterology: Proton Pump Inhibitors Passed - 08/24/2023  5:38 PM      Passed - Valid encounter within last 12 months    Recent Outpatient Visits           2 months ago Essential hypertension, benign   [name], [name]   5 months ago Uncontrolled type 2 diabetes mellitus with hyperglycemia, without long-term current use of insulin [facility])   [facility] Berniece Salines, [name]   8 months ago History of stroke   [facility] Della Goo F, [name]   8 months ago Acute nonintractable headache, unspecified headache type   [facility] Della Goo F, [name]   8 months ago Type 2 diabetes mellitus without complication, without long-term current use of insulin [facility])   [facility] Mecum, [name], [name]       Future Appointments             In 1 month [name], [name], [name] [facility], [name]   In 2 months [name], [name], [name] [facility], [facility]

## 2023-09-10 ENCOUNTER — Ambulatory Visit (INDEPENDENT_AMBULATORY_CARE_PROVIDER_SITE_OTHER): Payer: No Typology Code available for payment source

## 2023-09-10 DIAGNOSIS — I639 Cerebral infarction, unspecified: Secondary | ICD-10-CM | POA: Diagnosis not present

## 2023-09-10 LAB — CUP PACEART REMOTE DEVICE CHECK
Date Time Interrogation Session: 20250209232252
Implantable Pulse Generator Implant Date: 20240815

## 2023-09-13 ENCOUNTER — Encounter: Payer: Self-pay | Admitting: Cardiology

## 2023-09-18 ENCOUNTER — Telehealth: Payer: Self-pay | Admitting: Nurse Practitioner

## 2023-09-18 DIAGNOSIS — E1149 Type 2 diabetes mellitus with other diabetic neurological complication: Secondary | ICD-10-CM

## 2023-09-18 NOTE — Telephone Encounter (Signed)
 FYI

## 2023-09-18 NOTE — Telephone Encounter (Signed)
Pt is calling in because the copay for Semaglutide,0.25 or 0.5MG /DOS, (OZEMPIC, 0.25 OR 0.5 MG/DOSE,) 2 MG/3ML SOPN [[id number]] is $800 and patient cannot afford that. Patient would like to know if there is another medication she can try or what can be done about this. Please follow up with pt.

## 2023-09-18 NOTE — Addendum Note (Signed)
Addended by: Geralyn Flash D on: 09/18/2023 10:59 AM   Modules accepted: Orders

## 2023-09-18 NOTE — Progress Notes (Signed)
 Carelink Summary Report / Loop Recorder

## 2023-09-19 NOTE — Telephone Encounter (Signed)
Called patient she states has already called ins and will give me a call back regarding name of pill they told her about

## 2023-09-19 NOTE — Telephone Encounter (Signed)
Pa: for ozempic:Message from Plan Your PA has been resolved, no additional PA is required. For further inquiries please contact the number on the back of the member prescription card. (Message [phone])

## 2023-09-20 ENCOUNTER — Other Ambulatory Visit: Payer: Self-pay | Admitting: Nurse Practitioner

## 2023-09-20 DIAGNOSIS — K219 Gastro-esophageal reflux disease without esophagitis: Secondary | ICD-10-CM

## 2023-09-20 NOTE — Addendum Note (Signed)
Addended by: Davene Costain on: 09/20/2023 11:54 AM   Modules accepted: Orders

## 2023-09-20 NOTE — Telephone Encounter (Signed)
Refilled 08/03/23 # 90 wth 1 refill. Requested Prescriptions  Refused Prescriptions Disp Refills   pantoprazole (PROTONIX) 40 MG tablet [Pharmacy Med Name: PANTOPRAZOLE SOD DR 40 MG TAB] 90 tablet 1    Sig: TAKE 1 TABLET BY MOUTH EVERY DAY     Gastroenterology: Proton Pump Inhibitors Passed - 09/20/2023  2:08 PM      Passed - Valid encounter within last 12 months    Recent Outpatient Visits           3 months ago Essential hypertension, benign   [facility] Della Goo F, [name]   6 months ago Uncontrolled type 2 diabetes mellitus with hyperglycemia, without long-term current use of insulin [facility])   [facility] Berniece Salines, [name]   9 months ago History of stroke   [facility] Berniece Salines, [name]   9 months ago Acute nonintractable headache, unspecified headache type   [facility] Berniece Salines, [name]   9 months ago Type 2 diabetes mellitus without complication, without long-term current use of insulin [facility])   [facility] Mecum, [name], [name]       Future Appointments             In 1 month [name], [name], [name] [facility], [name]   In 1 month [name], [name], [name] [facility], [facility]

## 2023-09-24 ENCOUNTER — Telehealth: Payer: Self-pay | Admitting: Pharmacist

## 2023-09-24 ENCOUNTER — Other Ambulatory Visit (HOSPITAL_COMMUNITY): Payer: Self-pay

## 2023-09-24 NOTE — Progress Notes (Signed)
   Outreach Note  09/24/2023 Name: [name] MRN: [id number] DOB: 02/06/1961  Referred by: Berniece Salines, [name] Reason for referral : Medication Assistance  Receive a referral from office requesting outreach to patient regarding medication assistance  Was unable to reach patient via telephone today and have left HIPAA compliant voicemail asking patient to return my call.    Follow Up Plan: Will collaborate with Care Guide to outreach to schedule follow up with me  Estelle Grumbles, PharmD, [facility] [phone]

## 2023-09-27 ENCOUNTER — Other Ambulatory Visit: Payer: Self-pay | Admitting: Pharmacist

## 2023-09-27 DIAGNOSIS — E119 Type 2 diabetes mellitus without complications: Secondary | ICD-10-CM

## 2023-09-27 NOTE — Progress Notes (Signed)
 09/27/2023 Name: Elizabeth Scott MRN: [id number] DOB: 12-12-1960  Chief Complaint  Patient presents with   Medication Assistance   Medication Management    Elizabeth Scott is a 63 y.o. year old female who presented for a telephone visit.   They were referred to the pharmacist by their PCP for assistance in managing medication access.    Subjective:  Care Team: Primary Care Provider: Berniece Salines, [name] ; Next Scheduled Visit: 10/22/2023 Cardiologist: Lanier Prude, MD Neurologist: Alphonzo Lemmings, MD  Medication Access/Adherence  [phone] [place], [place] - [address] AT [facility] [address] [place] [address] Phone: [phone] Fax: [phone]  [id number] [place], [place] - [address] [place] [address] Phone: [phone] Fax: [phone]   Patient reports affordability concerns with their medications: Yes  Patient reports access/transportation concerns to their pharmacy: No  Patient reports adherence concerns with their medications:  No    Reports cost of Ozempic has become unaffordable as cost through her prescription coverage is now >$800/month   Diabetes:  Current medications:  - glipizide ER 10 mg twice daily - pioglitazone 15 mg daily - saxagliptin 5 mg daily - Ozempic 0.5 mg weekly on Sundays  Reports currently has 1 dose of Ozempic remaining  Medications tried in the past: metformin (allergy - rash)  Current glucose readings: morning fasting: 110-115   Patient reports hypoglycemic s/sx including sweating, hot and shaky ~1 week ago  - Attributes to skipping breakfast    Objective:  Lab Results  Component Value Date   HGBA1C 7.0 (A) 06/22/2023    Lab Results  Component Value Date   CREATININE 0.67 03/16/2023   BUN 16 03/16/2023   NA 139 03/16/2023   K 4.1 03/16/2023   CL 106 03/16/2023   CO2 27 03/16/2023    Lab Results  Component  Value Date   CHOL 139 12/08/2022   HDL 39 (L) 12/08/2022   LDLCALC 78 12/08/2022   TRIG 111 12/08/2022   CHOLHDL 3.6 12/08/2022    Medications Reviewed Today     Reviewed by Manuela Neptune, RPH-CPP (Pharmacist) on 09/27/23 at [phone]  Med List Status: <None>   Medication Order Taking? Sig Documenting Provider Last Dose Status Informant  albuterol (VENTOLIN HFA) 108 (90 Base) MCG/ACT inhaler [id number] Yes Inhale 1-2 puffs into the lungs every 6 (six) hours as needed. Joycelyn Man M, [name] Taking Active   amLODipine (NORVASC) 5 MG tablet [id number] Yes TAKE 1 TABLET (5 MG TOTAL) BY MOUTH DAILY. Berniece Salines, [name] Taking Active   aspirin EC 81 MG tablet [id number] Yes Take 81 mg by mouth daily. [provider] Taking Active Self  atorvastatin (LIPITOR) 80 MG tablet [id number] Yes TAKE 1 TABLET BY MOUTH EVERY DAY Berniece Salines, [name] Taking Active   clopidogrel (PLAVIX) 75 MG tablet [id number] Yes Take 1 tablet (75 mg total) by mouth every evening. Alford Highland, MD Taking Active   ezetimibe (ZETIA) 10 MG tablet [id number] Yes TAKE 1 TABLET BY MOUTH EVERY DAY (PLEASE KEEP SCHEDULED APPT FOR ADDITIONAL REFILLS) Berniece Salines, [name] Taking Active   fluticasone (FLONASE) 50 MCG/ACT nasal spray [id number] Yes Place 2 sprays into both nostrils daily. Margaretann Loveless, [place] Taking Active   glipiZIDE (GLUCOTROL XL) 10 MG 24 hr tablet [id number] Yes TAKE 1 TABLET BY MOUTH TWICE A DAY Berniece Salines, [name] Taking Active   losartan (COZAAR) 50 MG  tablet [id number]  TAKE 1 TABLET BY MOUTH EVERY DAY Berniece Salines, [name]  Active   methocarbamol (ROBAXIN) 500 MG tablet [id number] No TAKE 1 TABLET (500 MG TOTAL) BY MOUTH 2 (TWO) TIMES DAILY AS NEEDED FOR MUSCLE SPASMS.  Patient not taking: Reported on 09/27/2023   Berniece Salines, [name] Not Taking Active   pantoprazole (PROTONIX) 40 MG tablet [id number] Yes TAKE 1 TABLET BY MOUTH EVERY DAY Berniece Salines, [name] Taking Active   pioglitazone (ACTOS)  15 MG tablet [id number] Yes Take 1 tablet (15 mg total) by mouth daily. Berniece Salines, [name] Taking Active   saxagliptin HCl (ONGLYZA) 5 MG TABS tablet [id number] Yes Take 5 mg by mouth daily. [provider] Taking Active   Semaglutide,0.25 or 0.5MG /DOS, (OZEMPIC, 0.25 OR 0.5 MG/DOSE,) 2 MG/1.5ML SOPN [id number] Yes Inject 0.5 mg into the skin once a week. [provider] Taking Active               Assessment/Plan:   Collaborated with CPhT [place] for support with verifying insurance and prescription test claims for this patient. - Find that on check for insurance, patient has two active coverages. On second coverage where patient is listed as coverage code 02, Ozempic covered under plan Patient confirms that she has 2 insurance coverages; is active on husband's insurance  [facility] RPh Denny Peon is able to locate prescription coverage for spouse's plan and process Ozempic through this coverage. Patient's copayment is $25/month with manufacturer [employer]  Identify therapeutic duplication: patient taking both GLP-1 receptor agonist, Ozempic, and DPP-4 inhibitor, saxagliptin. Due to overlapping mechanism of action/lack of additive glycemic benefit, recommend to avoid use of  DPP-4 inhibitor and GLP-1 receptor agonist in combination Collaborate with PCP to recommend discontinuation of saxagliptin as patient is able to continue Ozempic. Provider agrees Follow up with patient to provide update. Patient verbalizes understanding    Diabetes: - Reviewed long term cardiovascular and renal outcomes of uncontrolled blood sugar - Reviewed goal A1c, goal fasting, and goal 2 hour post prandial glucose - Reviewed dietary modifications including importance of having regular well-balanced meals and snacks throughout the day, while controlling carbohydrate portion sizes - Counsel patient on s/s of low blood sugar and how to treat lows Review rules of 15s - importance  of using 15 grams of sugar to treat low, recheck blood sugar in 15 minutes, treat again if remains low or, if back to normal, having meal if mealtime or snack  Review examples of sources of 15 grams of sugar  Encourage patient to consider picking up glucose tablets to carry with her - Recommend to check glucose, keep log of results and have this record to review at upcoming medical appointments. Patient to contact provider office sooner if needed for readings outside of established parameters or symptoms, particularly for any signs of hypoglycemia    Follow Up Plan: Clinical Pharmacist will follow up with patient by telephone on 10/15/2023 at 2:00 PM   Estelle Grumbles, PharmD, [facility] [phone]

## 2023-09-28 NOTE — Patient Instructions (Signed)
 Goals Addressed             This Visit's Progress    Pharmacy Goals       The goal A1c is less than 7%. This is the best way to reduce the risk of the long term complications of diabetes, including heart disease, kidney disease, eye disease, strokes, and nerve damage. An A1c of less than 7% corresponds with fasting sugars less than 130 and 2 hour after meal sugars less than 180.   Estelle Grumbles, PharmD, [facility] [phone]

## 2023-10-08 ENCOUNTER — Other Ambulatory Visit: Payer: Self-pay | Admitting: Nurse Practitioner

## 2023-10-08 DIAGNOSIS — E119 Type 2 diabetes mellitus without complications: Secondary | ICD-10-CM

## 2023-10-08 DIAGNOSIS — E785 Hyperlipidemia, unspecified: Secondary | ICD-10-CM

## 2023-10-09 MED ORDER — ATORVASTATIN CALCIUM 80 MG PO TABS: 80.0000 mg | ORAL_TABLET | Freq: Every day | ORAL | 0 refills | Status: DC

## 2023-10-09 NOTE — Telephone Encounter (Signed)
 Requested Prescriptions  Pending Prescriptions Disp Refills   atorvastatin (LIPITOR) 80 MG tablet [Pharmacy Med Name: ATORVASTATIN 80 MG TABLET] 90 tablet 0    Sig: TAKE 1 TABLET BY MOUTH EVERY DAY     Cardiovascular:  Antilipid - Statins Failed - 10/09/2023  4:04 PM      Failed - Lipid Panel in normal range within the last 12 months    Cholesterol, Total  Date Value Ref Range Status  08/30/2015 151 100 - 199 mg/dL Final   Cholesterol  Date Value Ref Range Status  12/08/2022 139 0 - 200 mg/dL Final   LDL Cholesterol (Calc)  Date Value Ref Range Status  11/07/2022 157 (H) mg/dL (calc) Final    Comment:    Reference range: <100 . Desirable range <100 mg/dL for primary prevention;   <70 mg/dL for patients with CHD or diabetic patients  with > or = 2 CHD risk factors. Marland Kitchen LDL-C is now calculated using the Martin-Hopkins  calculation, which is a validated novel method providing  better accuracy than the Friedewald equation in the  estimation of LDL-C.  Horald Pollen et al. Lenox Ahr. 0981;191(47): 2061-2068  (http://education.[employer].com/faq/FAQ164)    LDL Cholesterol  Date Value Ref Range Status  12/08/2022 78 0 - 99 mg/dL Final    Comment:           Total Cholesterol/HDL:CHD Risk Coronary Heart Disease Risk Table                     Men   Women  1/2 Average Risk   3.4   3.3  Average Risk       5.0   4.4  2 X Average Risk   9.6   7.1  3 X Average Risk  23.4   11.0        Use the calculated Patient Ratio above and the CHD Risk Table to determine the patient's CHD Risk.        ATP III CLASSIFICATION (LDL):  <100     mg/dL   Optimal  829-562  mg/dL   Near or Above                    Optimal  130-159  mg/dL   Borderline  130-865  mg/dL   High  >784     mg/dL   Very High Performed at [facility], [address] [place], [place] [address]    HDL  Date Value Ref Range Status  12/08/2022 39 (L) >40 mg/dL Final  52/84/1324 41 >40 mg/dL Final    Triglycerides  Date Value Ref Range Status  12/08/2022 111 <150 mg/dL Final         Passed - Patient is not pregnant      Passed - Valid encounter within last 12 months    Recent Outpatient Visits           3 months ago Essential hypertension, benign   [facility] Della Goo F, [name]   6 months ago Uncontrolled type 2 diabetes mellitus with hyperglycemia, without long-term current use of insulin [facility])   [facility] Berniece Salines, [name]   9 months ago History of stroke   [facility] Berniece Salines, [name]   10 months ago Acute nonintractable headache, unspecified headache type   [facility] Della Goo F, [name]   10 months ago Type 2 diabetes mellitus without complication,  without long-term current use of insulin [facility])   [facility] Mecum, [name], [name]       Future Appointments             In 1 week [name], [name], [name] [facility], [name]   In 1 month [name], [name], [name] [facility], [facility]

## 2023-10-09 NOTE — Addendum Note (Signed)
 Addended by: Ross Ludwig on: 10/09/2023 05:41 PM   Modules accepted: Orders

## 2023-10-15 ENCOUNTER — Other Ambulatory Visit: Payer: Self-pay | Admitting: Pharmacist

## 2023-10-15 ENCOUNTER — Ambulatory Visit (INDEPENDENT_AMBULATORY_CARE_PROVIDER_SITE_OTHER): Payer: No Typology Code available for payment source

## 2023-10-15 DIAGNOSIS — I639 Cerebral infarction, unspecified: Secondary | ICD-10-CM

## 2023-10-15 DIAGNOSIS — E119 Type 2 diabetes mellitus without complications: Secondary | ICD-10-CM

## 2023-10-15 NOTE — Progress Notes (Unsigned)
 10/15/2023 Name: Elizabeth Scott MRN: [id number] DOB: June 19, 1961  Chief Complaint  Patient presents with   Medication Management    Elizabeth Scott is a 63 y.o. year old female who presented for a telephone visit.   They were referred to the pharmacist by their PCP for assistance in managing medication access.      Subjective:   Care Team: Primary Care Provider: Berniece Salines, [name] ; Next Scheduled Visit: 10/22/2023 Cardiologist: Lanier Prude, MD Neurologist: Alphonzo Lemmings, MD  Medication Access/Adherence  [facility]:  [phone] [place] - [address] AT [facility] [address]  [place] [address] Phone: [phone] Fax: [phone]  [id number] [place], [place] - [address] [place] [address] Phone: [phone] Fax: [phone]   Patient reports affordability concerns with their medications: No Patient reports access/transportation concerns to their pharmacy: No  Patient reports adherence concerns with their medications:  No      Diabetes:   Current medications:  - glipizide ER 10 mg twice daily - pioglitazone 15 mg daily - Ozempic 0.5 mg weekly on Sundays   Medications tried in the past: metformin (allergy - rash), saxagliptin   Current glucose readings: morning fasting: 90s-110   Reports occasionally feeling hot/shaky if eats too light of a meal or eats a meal late   Objective:  Lab Results  Component Value Date   HGBA1C 7.0 (A) 06/22/2023    Lab Results  Component Value Date   CREATININE 0.67 03/16/2023   BUN 16 03/16/2023   NA 139 03/16/2023   K 4.1 03/16/2023   CL 106 03/16/2023   CO2 27 03/16/2023    Lab Results  Component Value Date   CHOL 139 12/08/2022   HDL 39 (L) 12/08/2022   LDLCALC 78 12/08/2022   TRIG 111 12/08/2022   CHOLHDL 3.6 12/08/2022    Medications Reviewed Today     Reviewed by Manuela Neptune, RPH-CPP (Pharmacist) on  10/15/23 at 1421  Med List Status: <None>   Medication Order Taking? Sig Documenting Provider Last Dose Status Informant  albuterol (VENTOLIN HFA) 108 (90 Base) MCG/ACT inhaler [id number]  Inhale 1-2 puffs into the lungs every 6 (six) hours as needed. Joycelyn Man M, [name]  Active   amLODipine (NORVASC) 5 MG tablet [id number]  TAKE 1 TABLET (5 MG TOTAL) BY MOUTH DAILY. Berniece Salines, [name]  Active   aspirin EC 81 MG tablet [id number]  Take 81 mg by mouth daily. [provider]  Active Self  atorvastatin (LIPITOR) 80 MG tablet [id number]  Take 1 tablet (80 mg total) by mouth daily. Berniece Salines, [name]  Active   clopidogrel (PLAVIX) 75 MG tablet [id number]  Take 1 tablet (75 mg total) by mouth every evening. Alford Highland, MD  Active   ezetimibe (ZETIA) 10 MG tablet [id number]  TAKE 1 TABLET BY MOUTH EVERY DAY (PLEASE KEEP SCHEDULED APPT FOR ADDITIONAL REFILLS) Berniece Salines, [name]  Active   fluticasone (FLONASE) 50 MCG/ACT nasal spray [id number]  Place 2 sprays into both nostrils daily. Joycelyn Man M, [place]  Active   glipiZIDE (GLUCOTROL XL) 10 MG 24 hr tablet [id number] Yes TAKE 1 TABLET BY MOUTH TWICE A DAY Berniece Salines, [name] Taking Active   losartan (COZAAR) 50 MG tablet [id number]  TAKE 1 TABLET BY MOUTH EVERY DAY Berniece Salines, [name]  Active   methocarbamol (ROBAXIN) 500 MG tablet [id number]  TAKE 1 TABLET (500  MG TOTAL) BY MOUTH 2 (TWO) TIMES DAILY AS NEEDED FOR MUSCLE SPASMS.  Patient not taking: Reported on 09/27/2023   Berniece Salines, [name]  Active   pantoprazole (PROTONIX) 40 MG tablet [id number]  TAKE 1 TABLET BY MOUTH EVERY DAY Berniece Salines, [name]  Active   pioglitazone (ACTOS) 15 MG tablet [id number] Yes Take 1 tablet (15 mg total) by mouth daily. Berniece Salines, [name] Taking Active   Semaglutide,0.25 or 0.5MG /DOS, (OZEMPIC, 0.25 OR 0.5 MG/DOSE,) 2 MG/1.5ML SOPN [id number] Yes Inject 0.5 mg into the skin once a week. [provider] Taking Active                Assessment/Plan:             Diabetes: - Reviewed long term cardiovascular and renal outcomes of uncontrolled blood sugar - Reviewed goal A1c, goal fasting, and goal 2 hour post prandial glucose - Reviewed dietary modifications including importance of having regular well-balanced meals and snacks throughout the day, while controlling carbohydrate portion sizes - Counsel patient on s/s of low blood sugar and how to treat lows Review rules of 15s - importance of using 15 grams of sugar to treat low, recheck blood sugar in 15 minutes, treat again if remains low or, if back to normal, having meal if mealtime or snack  Review examples of sources of 15 grams of sugar             Encourage patient to consider picking up glucose tablets to carry with her - Will collaborate with PCP to recommend reducing patient's glipizide ER dose to reduce risk of hypoglycemia.  - Recommend to check glucose, keep log of results and have this record to review at upcoming medical appointments. Patient to contact provider office sooner if needed for readings outside of established parameters or symptoms, particularly for any signs of hypoglycemia       Follow Up Plan: Clinical Pharmacist will follow up with patient by telephone on ***    Estelle Grumbles, PharmD, [facility] [phone]

## 2023-10-16 LAB — CUP PACEART REMOTE DEVICE CHECK
Date Time Interrogation Session: 20250316232053
Implantable Pulse Generator Implant Date: 20240815

## 2023-10-17 ENCOUNTER — Encounter (HOSPITAL_COMMUNITY): Payer: Self-pay | Admitting: *Deleted

## 2023-10-17 ENCOUNTER — Emergency Department (HOSPITAL_COMMUNITY)
Admission: EM | Admit: 2023-10-17 | Discharge: 2023-10-17 | Disposition: A | Discharge status: Home / Self Care | Attending: Emergency Medicine | Admitting: Emergency Medicine

## 2023-10-17 ENCOUNTER — Other Ambulatory Visit: Payer: Self-pay

## 2023-10-17 DIAGNOSIS — E119 Type 2 diabetes mellitus without complications: Secondary | ICD-10-CM | POA: Insufficient documentation

## 2023-10-17 DIAGNOSIS — Z7982 Long term (current) use of aspirin: Secondary | ICD-10-CM | POA: Insufficient documentation

## 2023-10-17 DIAGNOSIS — K148 Other diseases of tongue: Secondary | ICD-10-CM | POA: Diagnosis present

## 2023-10-17 DIAGNOSIS — Z7984 Long term (current) use of oral hypoglycemic drugs: Secondary | ICD-10-CM | POA: Insufficient documentation

## 2023-10-17 DIAGNOSIS — I10 Essential (primary) hypertension: Secondary | ICD-10-CM | POA: Diagnosis not present

## 2023-10-17 DIAGNOSIS — Z7901 Long term (current) use of anticoagulants: Secondary | ICD-10-CM | POA: Diagnosis not present

## 2023-10-17 DIAGNOSIS — Z79899 Other long term (current) drug therapy: Secondary | ICD-10-CM | POA: Diagnosis not present

## 2023-10-17 HISTORY — DX: Cerebral infarction, unspecified: I63.9

## 2023-10-17 MED ORDER — LIDOCAINE VISCOUS HCL 2 % MT SOLN
15.0000 mL | Freq: Once | OROMUCOSAL | Status: AC
  Administered 2023-10-17: 15 mL via OROMUCOSAL
  Filled 2023-10-17: qty 15

## 2023-10-17 MED ORDER — SILVER NITRATE-POT NITRATE 75-25 % EX MISC
1.0000 | Freq: Once | CUTANEOUS | Status: AC
  Administered 2023-10-17: 1 via TOPICAL
  Filled 2023-10-17: qty 10

## 2023-10-17 NOTE — ED Notes (Signed)
 Patient Alert and oriented to baseline. Stable and ambulatory to baseline. Patient verbalized understanding of the discharge instructions.  Patient belongings were taken by the patient.

## 2023-10-17 NOTE — ED Provider Notes (Signed)
 [place] EMERGENCY DEPARTMENT AT [facility] Provider Note   CSN: [id number] Arrival date & time: 10/17/23  4098     History  No chief complaint on file.   Elizabeth Scott is a 63 y.o. female.  Pt is a 63 yo female with pmhx significant for htn, dm2, high cholesterol, GERD, and hx CVA (on Plavix).  Pt has a spot on her tongue which has been intermittently bleeding for a few years.  Today, it was not stopping, so she came in.  She has not had it evaluated in the past.         Home Medications Prior to Admission medications   Medication Sig Start Date End Date Taking? Authorizing Provider  albuterol (VENTOLIN HFA) 108 (90 Base) MCG/ACT inhaler Inhale 1-2 puffs into the lungs every 6 (six) hours as needed. 04/19/23   Margaretann Loveless, [name]  amLODipine (NORVASC) 5 MG tablet TAKE 1 TABLET (5 MG TOTAL) BY MOUTH DAILY. 05/14/23   Berniece Salines, [name]  aspirin EC 81 MG tablet Take 81 mg by mouth daily. 08/23/21   [provider]  atorvastatin (LIPITOR) 80 MG tablet Take 1 tablet (80 mg total) by mouth daily. 10/09/23   Berniece Salines, [name]  clopidogrel (PLAVIX) 75 MG tablet Take 1 tablet (75 mg total) by mouth every evening. 12/08/22   Alford Highland, MD  ezetimibe (ZETIA) 10 MG tablet TAKE 1 TABLET BY MOUTH EVERY DAY (PLEASE KEEP SCHEDULED APPT FOR ADDITIONAL REFILLS) 06/05/23   Berniece Salines, [name]  fluticasone (FLONASE) 50 MCG/ACT nasal spray Place 2 sprays into both nostrils daily. 04/19/23   Margaretann Loveless, [name]  glipiZIDE (GLUCOTROL XL) 10 MG 24 hr tablet TAKE 1 TABLET BY MOUTH TWICE A DAY 05/01/23   Berniece Salines, [name]  losartan (COZAAR) 50 MG tablet TAKE 1 TABLET BY MOUTH EVERY DAY 08/14/23   Berniece Salines, [name]  methocarbamol (ROBAXIN) 500 MG tablet TAKE 1 TABLET (500 MG TOTAL) BY MOUTH 2 (TWO) TIMES DAILY AS NEEDED FOR MUSCLE SPASMS. Patient not taking: Reported on 09/27/2023 03/28/23   Berniece Salines, [name]  pantoprazole (PROTONIX) 40 MG tablet  TAKE 1 TABLET BY MOUTH EVERY DAY 08/03/23   Berniece Salines, [name]  pioglitazone (ACTOS) 15 MG tablet Take 1 tablet (15 mg total) by mouth daily. 06/22/23   Berniece Salines, [name]  Semaglutide,0.25 or 0.5MG /DOS, (OZEMPIC, 0.25 OR 0.5 MG/DOSE,) 2 MG/1.5ML SOPN Inject 0.5 mg into the skin once a week.    [provider]      Allergies    Metformin and related and Other    Review of Systems   Review of Systems  HENT:         Tongue bleeding    Physical Exam Updated Vital Signs BP (!) 167/70   Pulse 75   Temp 98 F (36.7 C) (Oral)   Resp 16   Ht 5\' 3"  (1.6 m)   Wt 90.7 kg   SpO2 99%   BMI 35.43 kg/m  Physical Exam Vitals and nursing note reviewed.  Constitutional:      Appearance: Normal appearance.  HENT:     Head: Normocephalic and atraumatic.     Comments: Small, raised area to the body of the tongue.  No active bleeding.    Right Ear: External ear normal.     Left Ear: External ear normal.     Nose: Nose normal.     Mouth/Throat:     Mouth:  Mucous membranes are moist.     Pharynx: Oropharynx is clear.  Eyes:     Extraocular Movements: Extraocular movements intact.     Conjunctiva/sclera: Conjunctivae normal.     Pupils: Pupils are equal, round, and reactive to light.  Cardiovascular:     Rate and Rhythm: Normal rate and regular rhythm.     Pulses: Normal pulses.     Heart sounds: Normal heart sounds.  Pulmonary:     Effort: Pulmonary effort is normal.     Breath sounds: Normal breath sounds.  Abdominal:     General: Abdomen is flat. Bowel sounds are normal.     Palpations: Abdomen is soft.  Musculoskeletal:        General: Normal range of motion.     Cervical back: Normal range of motion and neck supple.  Skin:    General: Skin is warm.     Capillary Refill: Capillary refill takes less than 2 seconds.  Neurological:     General: No focal deficit present.     Mental Status: She is alert and oriented to person, place, and time.  Psychiatric:        Mood  and Affect: Mood normal.        Behavior: Behavior normal.     ED Results / Procedures / Treatments   Labs (all labs ordered are listed, but only abnormal results are displayed) Labs Reviewed - No data to display  EKG None  Radiology No results found.  Procedures .Laceration Repair  Date/Time: 10/17/2023 9:02 AM  Performed by: Jacalyn Lefevre, MD Authorized by: Jacalyn Lefevre, MD   Consent:    Consent obtained:  Verbal Universal protocol:    Patient identity confirmed:  Verbally with patient Anesthesia:    Anesthesia method:  Topical application Laceration details:    Location:  Mouth   Mouth location:  Tongue, anterior 2/3   Length (cm):  0.1 Repair type:    Repair type:  Simple Post-procedure details:    Dressing:  Open (no dressing)   Procedure completion:  Tolerated well, no immediate complications Comments:     Tongue still bleeding after 10 min of pressure, so viscous lidocaine given to pt, then silver nitrate applied to bleeding area.  Bleeding now under control.     Medications Ordered in ED Medications  lidocaine (XYLOCAINE) 2 % viscous mouth solution 15 mL (15 mLs Mouth/Throat Given 10/17/23 0843)  silver nitrate applicators applicator 1 Application (1 Application Topical Given by Other 10/17/23 0903)    ED Course/ Medical Decision Making/ A&P                                 Medical Decision Making Risk Prescription drug management.   This patient presents to the ED for concern of tongue bleeding, this involves an extensive number of treatment options, and is a complaint that carries with it a high risk of complications and morbidity.  The differential diagnosis includes tongue carcinoma, granuloma, glossitis   Co morbidities that complicate the patient evaluation  htn, dm2, high cholesterol, GERD, and hx CVA (on Plavix)   Additional history obtained:  Additional history obtained from epic chart review  Medicines ordered and prescription  drug management:  I ordered medication including viscous lido and silvadene  for sx  Reevaluation of the patient after these medicines showed that the patient improved I have reviewed the patients home medicines and have made adjustments as needed  Problem List / ED Course:  Tongue lesion bleeding:  bleeding controlled with silver nitrate.  Pt will need bx of area.  She is told to f/u with ENT.  Return if worse.    Reevaluation:  After the interventions noted above, I reevaluated the patient and found that they have :improved   Social Determinants of Health:  Lives at home   Dispostion:  After consideration of the diagnostic results and the patients response to treatment, I feel that the patent would benefit from discharge with outpatient f/u.          Final Clinical Impression(s) / ED Diagnoses Final diagnoses:  Tongue lesion    Rx / DC Orders ED Discharge Orders          Ordered    Ambulatory referral to ENT        10/17/23 0814              Jacalyn Lefevre, MD 10/17/23 [phone]

## 2023-10-17 NOTE — ED Triage Notes (Signed)
 Pt c/o tongue bleeding consistently this morning. She reports she has a "bump" on her tongue that bleeds occasionally, but won't seem to stop today. Pt reports taking Plavix.

## 2023-10-19 ENCOUNTER — Other Ambulatory Visit: Payer: Self-pay | Admitting: Pharmacist

## 2023-10-19 DIAGNOSIS — E119 Type 2 diabetes mellitus without complications: Secondary | ICD-10-CM

## 2023-10-19 NOTE — Patient Instructions (Signed)
 Goals Addressed             This Visit's Progress    Pharmacy Goals       The goal A1c is less than 7%. This is the best way to reduce the risk of the long term complications of diabetes, including heart disease, kidney disease, eye disease, strokes, and nerve damage. An A1c of less than 7% corresponds with fasting sugars less than 130 and 2 hour after meal sugars less than 180.   Estelle Grumbles, PharmD, [facility] [phone]

## 2023-10-19 NOTE — Progress Notes (Signed)
   10/19/2023  Patient ID: Elizabeth Scott, female   DOB: 22-Jan-1961, 63 y.o.   MRN: [id number]  Collaborated with PCP to recommend reducing patient's glipizide ER dose to reduce risk of hypoglycemia. - Provider agreed with recommendation to decrease or even/eventually stop glipizide ER  Follow up with patient to provide update. Patient verbalizes understanding. Plans to reduce glipizide ER 10 mg to once daily, but understands may stop glipizide ER, particularly if needed for any further low blood sugar readings.  Patient shares that she had to reschedule her appointment with PCP, but will follow up with provider at visit, now on 10/26/2023.  Estelle Grumbles, PharmD, [facility] [phone]

## 2023-10-20 ENCOUNTER — Encounter: Payer: Self-pay | Admitting: Cardiology

## 2023-10-22 ENCOUNTER — Ambulatory Visit: Payer: Self-pay | Admitting: Nurse Practitioner

## 2023-10-22 NOTE — Progress Notes (Signed)
 Carelink Summary Report / Loop Recorder

## 2023-10-24 ENCOUNTER — Other Ambulatory Visit: Payer: Self-pay | Admitting: Nurse Practitioner

## 2023-10-24 DIAGNOSIS — K219 Gastro-esophageal reflux disease without esophagitis: Secondary | ICD-10-CM

## 2023-10-25 ENCOUNTER — Ambulatory Visit (INDEPENDENT_AMBULATORY_CARE_PROVIDER_SITE_OTHER): Admitting: Otolaryngology

## 2023-10-25 ENCOUNTER — Encounter (INDEPENDENT_AMBULATORY_CARE_PROVIDER_SITE_OTHER): Payer: Self-pay | Admitting: Otolaryngology

## 2023-10-25 VITALS — BP 196/84 | HR 84 | Ht 63.0 in | Wt 200.0 lb

## 2023-10-25 DIAGNOSIS — D3702 Neoplasm of uncertain behavior of tongue: Secondary | ICD-10-CM

## 2023-10-25 DIAGNOSIS — Z7901 Long term (current) use of anticoagulants: Secondary | ICD-10-CM

## 2023-10-25 MED ORDER — OXYMETAZOLINE HCL 0.05 % NA SOLN: 1.0000 | Freq: Two times a day (BID) | NASAL | 0 refills | Status: AC

## 2023-10-25 NOTE — Progress Notes (Unsigned)
   There were no vitals taken for this visit.   Subjective:    Patient ID: Elizabeth Scott, female    DOB: August 29, 1960, 63 y.o.   MRN: [id number]  HPI: Elizabeth Scott is a 63 y.o. female  No chief complaint on file.   Discussed the use of AI scribe software for clinical note transcription with the patient, who gave verbal consent to proceed.  History of Present Illness          06/22/2023   11:21 AM 03/16/2023    1:45 PM 12/15/2022   12:54 PM  Depression screen PHQ 2/9  Decreased Interest 0 0 0  Down, Depressed, Hopeless 0 0 0  PHQ - 2 Score 0 0 0    Relevant past medical, surgical, family and social history reviewed and updated as indicated. Interim medical history since our last visit reviewed. Allergies and medications reviewed and updated.  Review of Systems  Per HPI unless specifically indicated above     Objective:    There were no vitals taken for this visit.  {Vitals History (Optional):23777} Wt Readings from Last 3 Encounters:  10/17/23 200 lb (90.7 kg)  06/22/23 225 lb 12.8 oz (102.4 kg)  05/22/23 224 lb 13.9 oz (102 kg)    Physical Exam Physical Exam    Results for orders placed or performed in visit on 10/15/23  CUP PACEART REMOTE DEVICE CHECK   Collection Time: 10/14/23 11:20 PM  Result Value Ref Range   Date Time Interrogation Session [phone]    Pulse Generator Manufacturer MERM    Pulse Gen Model LNQ22 LINQ II    Pulse Gen Serial Number [id number]    Clinic Name [facility]    Implantable Pulse Generator Type ICM/ILR    Implantable Pulse Generator Implant Date [id number]    {Labs (Optional):23779}    Assessment & Plan:   Problem List Items Addressed This Visit   None    Assessment and Plan Assessment & Plan         Follow up plan: No follow-ups on file.

## 2023-10-25 NOTE — Progress Notes (Signed)
 ENT CONSULT:  Reason for Consult: tongue lesion x 6 months    HPI: Discussed the use of AI scribe software for clinical note transcription with the patient, who gave verbal consent to proceed.  History of Present Illness Elizabeth Scott is a 63 year old female with a history of stroke last year, on Plavix, who presents with a non-healing tongue lesion. She is accompanied by her husband. She was referred by an emergency room physician due to persistent bleeding from the tongue lesion.  She noticed a lesion on her tongue/mid dorsal surface approximately six months ago. Initially, it appeared as a small bump and has not caused any pain. However, the lesion has bled on several occasions, particularly during tooth brushing. Last Wednesday, the bleeding was significant and did not stop until she applied pressure, prompting a visit to the emergency room. The lesion has not changed in size since she first noticed it.  She is currently on Plavix due to a previous stroke, which may contribute to the bleeding. The lesion is located in the middle of her tongue, not on the side where she might accidentally bite it. No pain is associated with the lesion. Denies any other trauma to her tongue.   She denies any history of smoking, heavy alcohol use, or personal history of cancer. She uses mouthwash regularly, which may contain alcohol, and sometimes experiences a burning sensation when using it. No family history of cancer is reported.  Records Reviewed:  10/17/2023 ED Oneta Sigman is a 63 y.o. female.   Pt is a 63 yo female with pmhx significant for htn, dm2, high cholesterol, GERD, and hx CVA (on Plavix).  Pt has a spot on her tongue which has been intermittently bleeding for a few years.  Today, it was not stopping, so she came in.  She has not had it evaluated in the past.     Comments: Small, raised area to the body of the tongue.  No active bleeding.     Past Medical History:   Diagnosis Date   Allergy    Chronic sinusitis    COVID-19    Diabetes mellitus without complication (HCC)    GERD (gastroesophageal reflux disease)    High cholesterol    History of abnormal cervical Pap smear    Prior to Hysterectomy, normal paps for 2-3 years after Hysterectomy   History of gestational diabetes    Hypertension    Hypopotassemia    Microcytosis    Stroke [facility])     Past Surgical History:  Procedure Laterality Date   ABDOMINAL HYSTERECTOMY  2003   still has ovaries-for menorrhagia   CESAREAN SECTION     COLONOSCOPY  02/22/14   repeat 5 years   COLONOSCOPY WITH PROPOFOL N/A 05/22/2023   Procedure: COLONOSCOPY WITH PROPOFOL;  Surgeon: Midge Minium, MD;  Location: [facility] ENDOSCOPY;  Service: Endoscopy;  Laterality: N/A;   ESOPHAGOGASTRODUODENOSCOPY  02/22/14   TUBAL LIGATION      Family History  Problem Relation Age of Onset   Diabetes Mother    Heart disease Mother    Hypertension Mother    Stroke Mother    Diabetes Father    Hypertension Father    Asthma Sister    Diabetes Sister    Hypertension Sister    Lung disease Sister    Hypertension Brother    Heart disease Brother    Stroke Brother    Asthma Sister    Diabetes Sister    Hypertension Sister  Congestive Heart Failure Maternal Grandmother    Cancer Paternal Grandfather        stomach cancer   COPD Neg Hx     Social History:  reports that she has never smoked. She has never used smokeless tobacco. She reports that she does not drink alcohol and does not use drugs.  Allergies:  Allergies  Allergen Reactions   Metformin And Related Other (See Comments)    Rash, Thrush    Other     walnuts    Medications: I have reviewed the patient's current medications.  The PMH, PSH, Medications, Allergies, and SH were reviewed and updated.  ROS: Constitutional: Negative for fever, weight loss and weight gain. Cardiovascular: Negative for chest pain and dyspnea on exertion. Respiratory: Is  not experiencing shortness of breath at rest. Gastrointestinal: Negative for nausea and vomiting. Neurological: Negative for headaches. Psychiatric: The patient is not nervous/anxious  Blood pressure (!) 196/84, pulse 84, height 5\' 3"  (1.6 m), weight 200 lb (90.7 kg), SpO2 98%. Body mass index is 35.43 kg/m.  PHYSICAL EXAM:  Exam: General: Well-developed, well-nourished Respiratory Respiratory effort: Equal inspiration and expiration without stridor Cardiovascular Peripheral Vascular: Warm extremities with equal color/perfusion Eyes: No nystagmus with equal extraocular motion bilaterally Neuro/Psych/Balance: Patient oriented to person, place, and time; Appropriate mood and affect; Gait is intact with no imbalance; Cranial nerves I-XII are intact Head and Face Inspection: Normocephalic and atraumatic without mass or lesion Palpation: Facial skeleton intact without bony stepoffs Salivary Glands: No mass or tenderness Facial Strength: Facial motility symmetric and full bilaterally ENT Pinna: External ear intact and fully developed External canal: Canal is patent with intact skin Tympanic Membrane: Clear and mobile External Nose: No scar or anatomic deformity Internal Nose: Septum is relatively straight on anterior rhinoscopy.  Lips, Teeth, and gums: Mucosa and teeth intact and viable TMJ: No pain to palpation with full mobility Oral cavity/oropharynx: No erythema or exudate, no lesions present Tongue with a small hemangioma vs surface lesion 2-3 mm in size, in the center of the dorsal surface, no active bleeding Neck Neck and Trachea: Midline trachea without mass or lesion Thyroid: No mass or nodularity Lymphatics: No lymphadenopathy   Assessment/Plan: Encounter Diagnoses  Name Primary?   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 We discussed that most likely this is a non-cancerous lesion, but tissue diagnosis is required to determine malignancy risk. Persistent bleeding could be due to Plavix. Excision recommended for definitive diagnosis. - Schedule excision with local anesthesia. - Fax clearance form to her medical doctor regarding Plavix hold. - Prescribe Afrin nasal spray for bleeding control. Instruct on application method (cotton ball soaked in Afrin to be applied on the lesion with steady pressure) - Advised soft diet post-procedure.        Thank you for allowing me to participate in the care of this patient. Please do not hesitate to contact me with any questions or concerns.   Ashok Croon, MD Otolaryngology [facility] Phone: [phone] Fax: [phone]    10/25/2023, 10:10 AM

## 2023-10-26 ENCOUNTER — Encounter: Payer: Self-pay | Admitting: Nurse Practitioner

## 2023-10-26 ENCOUNTER — Ambulatory Visit: Admitting: Nurse Practitioner

## 2023-10-26 VITALS — BP 128/70 | HR 87 | Resp 18 | Ht 63.0 in | Wt 226.3 lb

## 2023-10-26 DIAGNOSIS — Z8673 Personal history of transient ischemic attack (TIA), and cerebral infarction without residual deficits: Secondary | ICD-10-CM | POA: Diagnosis not present

## 2023-10-26 DIAGNOSIS — Z7984 Long term (current) use of oral hypoglycemic drugs: Secondary | ICD-10-CM

## 2023-10-26 DIAGNOSIS — E1169 Type 2 diabetes mellitus with other specified complication: Secondary | ICD-10-CM | POA: Diagnosis not present

## 2023-10-26 DIAGNOSIS — E119 Type 2 diabetes mellitus without complications: Secondary | ICD-10-CM

## 2023-10-26 DIAGNOSIS — E1149 Type 2 diabetes mellitus with other diabetic neurological complication: Secondary | ICD-10-CM

## 2023-10-26 DIAGNOSIS — I10 Essential (primary) hypertension: Secondary | ICD-10-CM

## 2023-10-26 DIAGNOSIS — E785 Hyperlipidemia, unspecified: Secondary | ICD-10-CM

## 2023-10-26 DIAGNOSIS — Z7985 Long-term (current) use of injectable non-insulin antidiabetic drugs: Secondary | ICD-10-CM

## 2023-10-26 DIAGNOSIS — K219 Gastro-esophageal reflux disease without esophagitis: Secondary | ICD-10-CM

## 2023-10-26 DIAGNOSIS — Z6837 Body mass index (BMI) 37.0-37.9, adult: Secondary | ICD-10-CM

## 2023-10-26 DIAGNOSIS — E782 Mixed hyperlipidemia: Secondary | ICD-10-CM

## 2023-10-26 MED ORDER — PANTOPRAZOLE SODIUM 40 MG PO TBEC
40.0000 mg | DELAYED_RELEASE_TABLET | Freq: Every day | ORAL | 1 refills | Status: DC
Start: 2023-10-26 — End: 2024-03-10

## 2023-10-26 MED ORDER — AMLODIPINE BESYLATE 5 MG PO TABS: 5.0000 mg | ORAL_TABLET | Freq: Every day | ORAL | 1 refills | Status: DC

## 2023-10-26 MED ORDER — LOSARTAN POTASSIUM 50 MG PO TABS: 50.0000 mg | ORAL_TABLET | Freq: Every day | ORAL | 1 refills | Status: DC

## 2023-10-26 MED ORDER — PIOGLITAZONE HCL 15 MG PO TABS: 15.0000 mg | ORAL_TABLET | Freq: Every day | ORAL | 1 refills | Status: DC

## 2023-10-26 MED ORDER — EZETIMIBE 10 MG PO TABS: 10.0000 mg | ORAL_TABLET | Freq: Every day | ORAL | 1 refills | Status: DC

## 2023-10-26 MED ORDER — CLOPIDOGREL BISULFATE 75 MG PO TABS: 75.0000 mg | ORAL_TABLET | Freq: Every evening | ORAL | 1 refills | Status: DC

## 2023-10-26 MED ORDER — ATORVASTATIN CALCIUM 80 MG PO TABS: 80.0000 mg | ORAL_TABLET | Freq: Every day | ORAL | 0 refills | Status: DC

## 2023-10-26 NOTE — Telephone Encounter (Signed)
 pantoprazole (PROTONIX) 40 MG tablet 90 tablet 1 10/26/2023 --   Sig - Route: Take 1 tablet (40 mg total) by mouth daily. - Oral   Sent to pharmacy as: pantoprazole (PROTONIX) 40 MG tablet   E-Prescribing Status: Receipt confirmed by pharmacy (10/26/2023  9:13 AM EDT)     Requested Prescriptions  Pending Prescriptions Disp Refills   pantoprazole (PROTONIX) 40 MG tablet [Pharmacy Med Name: PANTOPRAZOLE SOD DR 40 MG TAB] 90 tablet 1    Sig: TAKE 1 TABLET BY MOUTH EVERY DAY     Gastroenterology: Proton Pump Inhibitors Passed - 10/26/2023 10:32 AM      Passed - Valid encounter within last 12 months    Recent Outpatient Visits           Today Essential hypertension, benign   [facility] Berniece Salines, [name]       Future Appointments             In 2 weeks [name], [name], [name] [facility], [facility]

## 2023-10-26 NOTE — Patient Instructions (Signed)
 VISIT SUMMARY:  Today, we reviewed your chronic conditions including type 2 diabetes, hypertension, hyperlipidemia, and GERD. We also discussed a recent tongue lesion and your allergy management.  YOUR PLAN:  -TYPE 2 DIABETES MELLITUS: Type 2 diabetes is a condition where your body does not use insulin properly, leading to high blood sugar levels. Your blood sugar levels are currently well-controlled. We will check your HbA1c to see if we can reduce your glipizide dose, aiming to manage your diabetes primarily with Ozempic. Continue taking Ozempic 0.5 mg weekly and Actos 15 mg daily. We will also perform a microalbumin urine test.  -TONGUE LESION: You had a significant bleeding incident from a bump on your tongue, likely due to Plavix. An ENT specialist will biopsy the area in May. We will coordinate with the ENT regarding the biopsy and possibly stopping Plavix temporarily to reduce bleeding risk.  -STROKE: You have a history of stroke and are taking Plavix to prevent another stroke. Continue taking Plavix 75 mg daily.  -HYPERTENSION: Hypertension is high blood pressure. Your blood pressure is well-controlled. Continue taking amlodipine 5 mg daily and losartan 15 mg daily.  -HYPERLIPIDEMIA: Hyperlipidemia is high cholesterol. Continue taking atorvastatin 80 mg daily and Zetia 10 mg daily.  -GASTROESOPHAGEAL REFLUX DISEASE (GERD): GERD is a condition where stomach acid frequently flows back into the tube connecting your mouth and stomach. Continue taking pantoprazole 40 mg daily. We will ensure it is prescribed as a 90-day supply. Contact our office if you continue to have issues with the pharmacy.  -ALLERGIC RHINITIS: Allergic rhinitis is an allergic reaction that causes sneezing, stuffy nose, and other similar symptoms. Since Zyrtec is no longer effective, switch to Allegra or Claritin. Use Flonase as needed for nasal symptoms.  INSTRUCTIONS:  Please follow up with the lab for your HbA1c and  microalbumin urine tests. Coordinate with the ENT for your tongue biopsy and discuss the temporary discontinuation of Plavix. Contact our office if you have any issues with your pantoprazole prescription.

## 2023-10-27 LAB — LIPID PANEL
Cholesterol: 108 mg/dL (ref <200)
HDL: 40 mg/dL — ABNORMAL LOW (ref >=50)
LDL Cholesterol (Calc): 56 mg/dL
Non-HDL Cholesterol (Calc): 68 mg/dL (ref <130)
Total CHOL/HDL Ratio: 2.7 (calc) (ref <5.0)
Triglycerides: 50 mg/dL (ref <150)

## 2023-10-27 LAB — MICROALBUMIN / CREATININE URINE RATIO
Creatinine, Urine: 79 mg/dL (ref 20–275)
Microalb Creat Ratio: 3 mg/g{creat} (ref <30)
Microalb, Ur: 0.2 mg/dL

## 2023-10-27 LAB — COMPLETE METABOLIC PANEL WITHOUT GFR
AG Ratio: 1.8 (calc) (ref 1.0–2.5)
ALT: 17 U/L (ref 6–29)
AST: 13 U/L (ref 10–35)
Albumin: 4.2 g/dL (ref 3.6–5.1)
Alkaline phosphatase (APISO): 68 U/L (ref 37–153)
BUN: 19 mg/dL (ref 7–25)
CO2: 31 mmol/L (ref 20–32)
Calcium: 9.7 mg/dL (ref 8.6–10.4)
Chloride: 105 mmol/L (ref 98–110)
Creat: 0.6 mg/dL (ref 0.50–1.05)
Globulin: 2.3 g/dL (ref 1.9–3.7)
Glucose, Bld: 87 mg/dL (ref 65–99)
Potassium: 4.4 mmol/L (ref 3.5–5.3)
Sodium: 140 mmol/L (ref 135–146)
Total Bilirubin: 0.8 mg/dL (ref 0.2–1.2)
Total Protein: 6.5 g/dL (ref 6.1–8.1)

## 2023-10-27 LAB — CBC WITH DIFFERENTIAL/PLATELET
Absolute Lymphocytes: 1509 {cells}/uL (ref 850–3900)
Absolute Monocytes: 582 {cells}/uL (ref 200–950)
Basophils Absolute: 57 {cells}/uL (ref 0–200)
Basophils Relative: 0.7 %
Eosinophils Absolute: 90 {cells}/uL (ref 15–500)
Eosinophils Relative: 1.1 %
HCT: 38 % (ref 35.0–45.0)
Hemoglobin: 12.2 g/dL (ref 11.7–15.5)
MCH: 24.2 pg — ABNORMAL LOW (ref 27.0–33.0)
MCHC: 32.1 g/dL (ref 32.0–36.0)
MCV: 75.2 fL — ABNORMAL LOW (ref 80.0–100.0)
MPV: 12.4 fL (ref 7.5–12.5)
Monocytes Relative: 7.1 %
Neutro Abs: 5961 {cells}/uL (ref 1500–7800)
Neutrophils Relative %: 72.7 %
Platelets: 237 10*3/uL (ref 140–400)
RBC: 5.05 10*6/uL (ref 3.80–5.10)
RDW: 16.9 % — ABNORMAL HIGH (ref 11.0–15.0)
Total Lymphocyte: 18.4 %
WBC: 8.2 10*3/uL (ref 3.8–10.8)

## 2023-10-27 LAB — HEMOGLOBIN A1C
Hgb A1c MFr Bld: 6.2 %{Hb} — ABNORMAL HIGH (ref <5.7)
Mean Plasma Glucose: 131 mg/dL
eAG (mmol/L): 7.3 mmol/L

## 2023-10-29 ENCOUNTER — Encounter: Payer: Self-pay | Admitting: Nurse Practitioner

## 2023-10-29 ENCOUNTER — Other Ambulatory Visit: Payer: Self-pay | Admitting: Nurse Practitioner

## 2023-10-30 ENCOUNTER — Telehealth (INDEPENDENT_AMBULATORY_CARE_PROVIDER_SITE_OTHER): Payer: Self-pay

## 2023-10-30 NOTE — Telephone Encounter (Signed)
 Spoke to the patient and notified her to stop the Plavix 7 days prior to surgery she can resume post -op after that.

## 2023-10-30 NOTE — Telephone Encounter (Signed)
-----   Message from [place] P sent at 10/30/2023 10:49 AM EDT ----- Regarding: Clearance The clearance has been received and uploaded to the chart.  It says for the patient to stop plavix for 7 days prior.  Not sure if you have received a copy and need to call the patient to let her know.  Thanks

## 2023-11-08 NOTE — Progress Notes (Unsigned)
 Name: Elizabeth Scott   MRN: [id number]    DOB: [birth year]   Date:11/09/2023       Progress Note  Subjective  Chief Complaint  Chief Complaint  Patient presents with   Annual Exam    HPI  Patient presents for annual CPE.  Diet: well balanced diet Exercise: walks daily at work 8 hrs a day  Sleep: average 6 hrs a night Last dental exam: about a year ago Last eye exam: about 6 months ago  [employer] Visit from 06/22/2023 in [facility]  AUDIT-C Score 0      Depression: Phq 9 is  negative    11/09/2023    2:41 PM 10/26/2023    8:59 AM 06/22/2023   11:21 AM 03/16/2023    1:45 PM 12/15/2022   12:54 PM  Depression screen PHQ 2/9  Decreased Interest 0 0 0 0 0  Down, Depressed, Hopeless 0 0 0 0 0  PHQ - 2 Score 0 0 0 0 0  Altered sleeping 1 0     Tired, decreased energy 0 0     Change in appetite 0 0     Feeling bad or failure about yourself  0 0     Trouble concentrating 0 0     Moving slowly or fidgety/restless 0 0     Suicidal thoughts 0 0     PHQ-9 Score 1 0     Difficult doing work/chores Not difficult at all Not difficult at all      Hypertension: BP Readings from Last 3 Encounters:  11/09/23 (!) 142/78  10/26/23 128/70  10/25/23 (!) 196/84   Obesity: Wt Readings from Last 3 Encounters:  11/09/23 226 lb 11.2 oz (102.8 kg)  10/26/23 226 lb 4.8 oz (102.6 kg)  10/25/23 200 lb (90.7 kg)   BMI Readings from Last 3 Encounters:  11/09/23 40.16 kg/m  10/26/23 40.09 kg/m  10/25/23 35.43 kg/m     Vaccines:  HPV: up to at age 75 , ask insurance if age between 78-45  Shingrix: 42-64 yo and ask insurance if covered when patient above 1 yo Pneumonia: educated and discussed with patient. Flu: educated and discussed with patient.  Hep C Screening: complete 10/17/17 STD testing and prevention (HIV/chl/gon/syphilis): completed 12/07/2022 Intimate partner violence:none Sexual History : sexually active Menstrual  History/LMP/Abnormal Bleeding: hysterectomy in early 2000s Incontinence Symptoms: none  Breast cancer:  - Last Mammogram: 12/14/2022 - BRCA gene screening: n/a  Osteoporosis: Discussed high calcium and vitamin D supplementation, weight bearing exercises  Cervical cancer screening: completed 11/07/2022, due 2029  Skin cancer: Discussed monitoring for atypical lesions  Colorectal cancer: completed 05/22/2023, due 2029 Lung cancer:  n/a Low Dose CT Chest recommended if Age 84-80 years, 20 pack-year currently smoking OR have quit w/in 15years. Patient does not qualify.   ECG: 01/23/2023  Advanced Care Planning: A voluntary discussion about advance care planning including the explanation and discussion of advance directives.  Discussed health care proxy and Living will, and the patient was able to identify a health care proxy as  Husband, children.  Patient does not have a living will at present time. If patient does have living will, I have requested they bring this to the clinic to be scanned in to their chart.  Lipids: Lab Results  Component Value Date   CHOL 108 10/26/2023   CHOL 139 12/08/2022   CHOL 231 (H) 11/07/2022   Lab Results  Component Value Date   HDL  40 (L) 10/26/2023   HDL 39 (L) 12/08/2022   HDL 45 (L) 11/07/2022   Lab Results  Component Value Date   LDLCALC 56 10/26/2023   LDLCALC 78 12/08/2022   LDLCALC 157 (H) 11/07/2022   Lab Results  Component Value Date   TRIG 50 10/26/2023   TRIG 111 12/08/2022   TRIG 159 (H) 11/07/2022   Lab Results  Component Value Date   CHOLHDL 2.7 10/26/2023   CHOLHDL 3.6 12/08/2022   CHOLHDL 5.1 (H) 11/07/2022   No results found for: "LDLDIRECT"  Glucose: Glucose, Bld  Date Value Ref Range Status  10/26/2023 87 65 - 99 mg/dL Final    Comment:    .            Fasting reference interval .   03/16/2023 108 (H) 65 - 99 mg/dL Final    Comment:    .            Fasting reference interval . For someone without known  diabetes, a glucose value between 100 and 125 mg/dL is consistent with prediabetes and should be confirmed with a follow-up test. .   12/10/2022 240 (H) 70 - 99 mg/dL Final    Comment:    Glucose reference range applies only to samples taken after fasting for at least 8 hours.   Glucose-Capillary  Date Value Ref Range Status  05/22/2023 137 (H) 70 - 99 mg/dL Final    Comment:    Glucose reference range applies only to samples taken after fasting for at least 8 hours.  12/10/2022 173 (H) 70 - 99 mg/dL Final    Comment:    Glucose reference range applies only to samples taken after fasting for at least 8 hours.  12/08/2022 177 (H) 70 - 99 mg/dL Final    Comment:    Glucose reference range applies only to samples taken after fasting for at least 8 hours.    Patient Active Problem List   Diagnosis Date Noted   History of stroke 03/16/2023   Acute left PCA stroke (HCC) 12/07/2022   Headache 12/07/2022   Paronychia of great toe, right 02/11/2020   Colon cancer screening 09/08/2016   Left shoulder pain 09/08/2016   Encounter for medication monitoring 03/31/2016   Walnut allergy 08/30/2015   Preventative health care 08/30/2015   Type 2 diabetes mellitus without complication, without long-term current use of insulin (HCC) 04/27/2015   Hyperlipidemia 04/27/2015   Essential hypertension, benign 04/27/2015   Microcytosis 04/27/2015   Class 2 severe obesity with serious comorbidity and body mass index (BMI) of 37.0 to 37.9 in adult (HCC) 04/27/2015   Gastroesophageal reflux disease without esophagitis 01/08/2015    Past Surgical History:  Procedure Laterality Date   ABDOMINAL HYSTERECTOMY  2003   still has ovaries-for menorrhagia   CESAREAN SECTION     COLONOSCOPY  02/22/14   repeat 5 years   COLONOSCOPY WITH PROPOFOL N/A 05/22/2023   Procedure: COLONOSCOPY WITH PROPOFOL;  Surgeon: Midge Minium, MD;  Location: [facility] ENDOSCOPY;  Service: Endoscopy;  Laterality: N/A;    ESOPHAGOGASTRODUODENOSCOPY  02/22/14   TUBAL LIGATION      Family History  Problem Relation Age of Onset   Diabetes Mother    Heart disease Mother    Hypertension Mother    Stroke Mother    Diabetes Father    Hypertension Father    Asthma Sister    Diabetes Sister    Hypertension Sister    Lung disease Sister  Hypertension Brother    Heart disease Brother    Stroke Brother    Asthma Sister    Diabetes Sister    Hypertension Sister    Congestive Heart Failure Maternal Grandmother    Cancer Paternal Grandfather        stomach cancer   COPD Neg Hx     Social History   Socioeconomic History   Marital status: Married    Spouse name: Not on file   Number of children: Not on file   Years of education: Not on file   Highest education level: Not on file  Occupational History   Not on file  Tobacco Use   Smoking status: Never   Smokeless tobacco: Never  Vaping Use   Vaping status: Never Used  Substance and Sexual Activity   Alcohol use: No   Drug use: No   Sexual activity: Yes  Other Topics Concern   Not on file  Social History Narrative   Not on file   Social Drivers of Health   Financial Resource Strain: Low Risk  (11/07/2022)   Overall Financial Resource Strain (CARDIA)    Difficulty of Paying Living Expenses: Not hard at all  Food Insecurity: No Food Insecurity (12/08/2022)   Hunger Vital Sign    Worried About Running Out of Food in the Last Year: Never true    Ran Out of Food in the Last Year: Never true  Transportation Needs: No Transportation Needs (12/08/2022)   PRAPARE - Administrator, Civil Service (Medical): No    Lack of Transportation (Non-Medical): No  Physical Activity: Inactive (11/07/2022)   Exercise Vital Sign    Days of Exercise per Week: 0 days    Minutes of Exercise per Session: 0 min  Stress: No Stress Concern Present (11/07/2022)   [employer] of Occupational Health - Occupational Stress Questionnaire    Feeling of Stress  : Only a little  Social Connections: Socially Integrated (11/07/2022)   Social Connection and Isolation Panel [NHANES]    Frequency of Communication with Friends and Family: More than three times a week    Frequency of Social Gatherings with Friends and Family: More than three times a week    Attends Religious Services: More than 4 times per year    Active Member of [employer] or Organizations: No    Attends Engineer, structural: More than 4 times per year    Marital Status: Married  Catering manager Violence: Not At Risk (12/08/2022)   Humiliation, Afraid, Rape, and Kick questionnaire    Fear of Current or Ex-Partner: No    Emotionally Abused: No    Physically Abused: No    Sexually Abused: No     Current Outpatient Medications:    albuterol (VENTOLIN HFA) 108 (90 Base) MCG/ACT inhaler, Inhale 1-2 puffs into the lungs every 6 (six) hours as needed., Disp: 8 g, Rfl: 0   amLODipine (NORVASC) 5 MG tablet, Take 1 tablet (5 mg total) by mouth daily., Disp: 90 tablet, Rfl: 1   aspirin EC 81 MG tablet, Take 81 mg by mouth daily., Disp: , Rfl:    atorvastatin (LIPITOR) 80 MG tablet, Take 1 tablet (80 mg total) by mouth daily., Disp: 90 tablet, Rfl: 0   clopidogrel (PLAVIX) 75 MG tablet, Take 1 tablet (75 mg total) by mouth every evening., Disp: 90 tablet, Rfl: 1   ezetimibe (ZETIA) 10 MG tablet, Take 1 tablet (10 mg total) by mouth daily., Disp: 90 tablet, Rfl:  1   fluticasone (FLONASE) 50 MCG/ACT nasal spray, Place 2 sprays into both nostrils daily., Disp: 16 g, Rfl: 0   losartan (COZAAR) 50 MG tablet, Take 1 tablet (50 mg total) by mouth daily., Disp: 90 tablet, Rfl: 1   methocarbamol (ROBAXIN) 500 MG tablet, TAKE 1 TABLET (500 MG TOTAL) BY MOUTH 2 (TWO) TIMES DAILY AS NEEDED FOR MUSCLE SPASMS., Disp: 20 tablet, Rfl: 0   oxymetazoline (AFRIN NASAL SPRAY) 0.05 % nasal spray, Place 1 spray into both nostrils 2 (two) times daily., Disp: 30 mL, Rfl: 0   pantoprazole (PROTONIX) 40 MG tablet, Take  1 tablet (40 mg total) by mouth daily., Disp: 90 tablet, Rfl: 1   pioglitazone (ACTOS) 15 MG tablet, Take 1 tablet (15 mg total) by mouth daily., Disp: 90 tablet, Rfl: 1   Semaglutide,0.25 or 0.5MG /DOS, (OZEMPIC, 0.25 OR 0.5 MG/DOSE,) 2 MG/1.5ML SOPN, Inject 0.5 mg into the skin once a week., Disp: , Rfl:   Allergies  Allergen Reactions   Metformin And Related Other (See Comments)    Rash, Thrush    Other     walnuts     ROS  Constitutional: Negative for fever or weight change.  Respiratory: Negative for cough and shortness of breath.   Cardiovascular: Negative for chest pain or palpitations.  Gastrointestinal: Negative for abdominal pain, no bowel changes.  Musculoskeletal: Negative for gait problem or joint swelling.  Skin: Negative for rash.  Neurological: Negative for dizziness or headache.  No other specific complaints in a complete review of systems (except as listed in HPI above).  Objective  Vitals:   11/09/23 1417  BP: (!) 142/78  Pulse: 99  Resp: 18  Temp: (!) 97.4 F (36.3 C)  Weight: 226 lb 11.2 oz (102.8 kg)  Height: 5\' 3"  (1.6 m)    Body mass index is 40.16 kg/m.  Physical Exam Vitals reviewed.  Constitutional:      Appearance: Normal appearance.  HENT:     Head: Normocephalic.     Right Ear: Tympanic membrane normal.     Left Ear: Tympanic membrane normal.     Nose: Nose normal.  Eyes:     Extraocular Movements: Extraocular movements intact.     Conjunctiva/sclera: Conjunctivae normal.     Pupils: Pupils are equal, round, and reactive to light.  Neck:     Thyroid: No thyroid mass, thyromegaly or thyroid tenderness.  Cardiovascular:     Rate and Rhythm: Normal rate and regular rhythm.     Pulses: Normal pulses.     Heart sounds: Normal heart sounds.  Pulmonary:     Effort: Pulmonary effort is normal.     Breath sounds: Normal breath sounds.  Abdominal:     General: Bowel sounds are normal.     Palpations: Abdomen is soft.   Musculoskeletal:        General: Normal range of motion.     Cervical back: Normal range of motion and neck supple.     Right lower leg: No edema.     Left lower leg: No edema.  Skin:    General: Skin is warm and dry.     Capillary Refill: Capillary refill takes less than 2 seconds.  Neurological:     General: No focal deficit present.     Mental Status: She is alert and oriented to person, place, and time. Mental status is at baseline.  Psychiatric:        Mood and Affect: Mood normal.  Behavior: Behavior normal.        Thought Content: Thought content normal.        Judgment: Judgment normal.      Recent Results (from the past 2160 hours)  CUP PACEART REMOTE DEVICE CHECK     Status: None   Collection Time: 09/09/23 11:22 PM  Result Value Ref Range   Date Time Interrogation Session [id number]    Pulse Generator Manufacturer MERM    Pulse Gen Model LNQ22 LINQ II    Pulse Gen Serial Number [id number]    Clinic Name [facility]    Implantable Pulse Generator Type ICM/ILR    Implantable Pulse Generator Implant Date [id number]    Eval Rhythm SR at 70 bpm   CUP PACEART REMOTE DEVICE CHECK     Status: None   Collection Time: 10/14/23 11:20 PM  Result Value Ref Range   Date Time Interrogation Session [phone]    Pulse Generator Manufacturer MERM    Pulse Gen Model LNQ22 LINQ II    Pulse Gen Serial Number [id number]    Clinic [facility]    Implantable Pulse Generator Type ICM/ILR    Implantable Pulse Generator Implant Date [id number]   CBC with Differential/Platelet     Status: Abnormal   Collection Time: 10/26/23  9:16 AM  Result Value Ref Range   WBC 8.2 3.8 - 10.8 Thousand/uL   RBC 5.05 3.80 - 5.10 Million/uL   Hemoglobin 12.2 11.7 - 15.5 g/dL   HCT 32.3 55.7 - 32.2 %   MCV 75.2 (L) 80.0 - 100.0 fL   MCH 24.2 (L) 27.0 - 33.0 pg   MCHC 32.1 32.0 - 36.0 g/dL    Comment: For adults, a slight decrease in the calculated MCHC value (in the range of  30 to 32 g/dL) is most likely not clinically significant; however, it should be interpreted with caution in correlation with other red cell parameters and the patient's clinical condition.    RDW 16.9 (H) 11.0 - 15.0 %   Platelets 237 140 - 400 Thousand/uL   MPV 12.4 7.5 - 12.5 fL   Neutro Abs 5,961 1,500 - 7,800 cells/uL   Absolute Lymphocytes 1,509 850 - 3,900 cells/uL   Absolute Monocytes 582 200 - 950 cells/uL   Eosinophils Absolute 90 15 - 500 cells/uL   Basophils Absolute 57 0 - 200 cells/uL   Neutrophils Relative % 72.7 %   Total Lymphocyte 18.4 %   Monocytes Relative 7.1 %   Eosinophils Relative 1.1 %   Basophils Relative 0.7 %  COMPLETE METABOLIC PANEL WITHOUT GFR     Status: None   Collection Time: 10/26/23  9:16 AM  Result Value Ref Range   Glucose, Bld 87 65 - 99 mg/dL    Comment: .            Fasting reference interval .    BUN 19 7 - 25 mg/dL   Creat 0.25 4.27 - 0.62 mg/dL   BUN/Creatinine Ratio SEE NOTE: 6 - 22 (calc)    Comment:    Not Reported: BUN and Creatinine are within    reference range. .    Sodium 140 135 - 146 mmol/L   Potassium 4.4 3.5 - 5.3 mmol/L   Chloride 105 98 - 110 mmol/L   CO2 31 20 - 32 mmol/L   Calcium 9.7 8.6 - 10.4 mg/dL   Total Protein 6.5 6.1 - 8.1 g/dL   Albumin 4.2 3.6 - 5.1 g/dL   Globulin 2.3  1.9 - 3.7 g/dL (calc)   AG Ratio 1.8 1.0 - 2.5 (calc)   Total Bilirubin 0.8 0.2 - 1.2 mg/dL   Alkaline phosphatase (APISO) 68 37 - 153 U/L   AST 13 10 - 35 U/L   ALT 17 6 - 29 U/L  Lipid panel     Status: Abnormal   Collection Time: 10/26/23  9:16 AM  Result Value Ref Range   Cholesterol 108 <200 mg/dL   HDL 40 (L) > OR = 50 mg/dL   Triglycerides 50 <161 mg/dL   LDL Cholesterol (Calc) 56 mg/dL (calc)    Comment: Reference range: <100 . Desirable range <100 mg/dL for primary prevention;   <70 mg/dL for patients with CHD or diabetic patients  with > or = 2 CHD risk factors. Marland Kitchen LDL-C is now calculated using the Martin-Hopkins   calculation, which is a validated novel method providing  better accuracy than the Friedewald equation in the  estimation of LDL-C.  Horald Pollen et al. Lenox Ahr. 0960;454(09): 2061-2068  (http://education.[facility].com/faq/FAQ164)    Total CHOL/HDL Ratio 2.7 <5.0 (calc)   Non-HDL Cholesterol (Calc) 68 <811 mg/dL (calc)    Comment: For patients with diabetes plus 1 major ASCVD risk  factor, treating to a non-HDL-C goal of <100 mg/dL  (LDL-C of <91 mg/dL) is considered a therapeutic  option.   Hemoglobin A1c     Status: Abnormal   Collection Time: 10/26/23  9:16 AM  Result Value Ref Range   Hgb A1c MFr Bld 6.2 (H) <5.7 % of total Hgb    Comment: For someone without known diabetes, a hemoglobin  A1c value between 5.7% and 6.4% is consistent with prediabetes and should be confirmed with a  follow-up test. . For someone with known diabetes, a value <7% indicates that their diabetes is well controlled. A1c targets should be individualized based on duration of diabetes, age, comorbid conditions, and other considerations. . This assay result is consistent with an increased risk of diabetes. . Currently, no consensus exists regarding use of hemoglobin A1c for diagnosis of diabetes for children. .    Mean Plasma Glucose 131 mg/dL   eAG (mmol/L) 7.3 mmol/L  Microalbumin / creatinine urine ratio     Status: None   Collection Time: 10/26/23  9:16 AM  Result Value Ref Range   Creatinine, Urine 79 20 - 275 mg/dL   Microalb, Ur 0.2 mg/dL    Comment: Reference Range Not established    Microalb Creat Ratio 3 <30 mg/g creat    Comment: . The ADA defines abnormalities in albumin excretion as follows: Marland Kitchen Albuminuria Category        Result (mg/g creatinine) . Normal to Mildly increased   <30 Moderately increased         30-299  Severely increased           > OR = 300 . The ADA recommends that at least two of three specimens collected within a 3-6 month period be abnormal before  considering a patient to be within a diagnostic category.     Diabetic Foot Exam: Diabetic Foot Exam - Simple   Simple Foot Form Visual Inspection No deformities, no ulcerations, no other skin breakdown bilaterally: Yes Sensation Testing Intact to touch and monofilament testing bilaterally: Yes Pulse Check Posterior Tibialis and Dorsalis pulse intact bilaterally: Yes Comments      Fall Risk:    11/09/2023    2:37 PM 10/26/2023    8:59 AM 06/22/2023   11:21 AM 03/16/2023  1:44 PM 12/15/2022   12:54 PM  Fall Risk   Falls in the past year? 0 0 0 1 0  Number falls in past yr: 0 0 0 0 0  Injury with Fall? 0 0 0 0 0  Risk for fall due to :   No Fall Risks History of fall(s);No Fall Risks   Follow up  Falls evaluation completed Falls prevention discussed       Functional Status Survey: Is the patient deaf or have difficulty hearing?: No Does the patient have difficulty seeing, even when wearing glasses/contacts?: No Does the patient have difficulty concentrating, remembering, or making decisions?: No Does the patient have difficulty walking or climbing stairs?: No Does the patient have difficulty dressing or bathing?: No Does the patient have difficulty doing errands alone such as visiting a doctor's office or shopping?: No   Assessment & Plan   1. Annual physical exam (Primary) - had labs 2 weeks ago - [facility] Diabetes Foot Exam - MM 3D SCREENING MAMMOGRAM BILATERAL BREAST  2. Type 2 diabetes mellitus without complication, without long-term current use of insulin (HCC) - doing well off of glipizide - [facility] Diabetes Foot Exam  3. Encounter for screening mammogram for malignant neoplasm of breast  - MM 3D SCREENING MAMMOGRAM BILATERAL BREAST  -USPSTF grade A and B recommendations reviewed with patient; age-appropriate recommendations, preventive care, screening tests, etc discussed and encouraged; healthy living encouraged; see AVS for patient education given to  patient -Discussed importance of 150 minutes of physical activity weekly, eat two servings of fish weekly, eat one serving of tree nuts ( cashews, pistachios, pecans, almonds.Marland Kitchen) every other day, eat 6 servings of fruit/vegetables daily and drink plenty of water and avoid sweet beverages.   -Reviewed Health Maintenance: yes

## 2023-11-09 ENCOUNTER — Encounter: Payer: Self-pay | Admitting: Nurse Practitioner

## 2023-11-09 ENCOUNTER — Ambulatory Visit: Payer: Self-pay | Admitting: Nurse Practitioner

## 2023-11-09 VITALS — BP 142/78 | HR 99 | Temp 97.4°F | Resp 18 | Ht 63.0 in | Wt 226.7 lb

## 2023-11-09 DIAGNOSIS — E119 Type 2 diabetes mellitus without complications: Secondary | ICD-10-CM | POA: Diagnosis not present

## 2023-11-09 DIAGNOSIS — Z0001 Encounter for general adult medical examination with abnormal findings: Secondary | ICD-10-CM | POA: Diagnosis not present

## 2023-11-09 DIAGNOSIS — Z0000 Encounter for general adult medical examination without abnormal findings: Secondary | ICD-10-CM

## 2023-11-09 DIAGNOSIS — Z1231 Encounter for screening mammogram for malignant neoplasm of breast: Secondary | ICD-10-CM | POA: Diagnosis not present

## 2023-11-19 ENCOUNTER — Ambulatory Visit: Payer: No Typology Code available for payment source | Attending: Cardiology

## 2023-11-19 DIAGNOSIS — I639 Cerebral infarction, unspecified: Secondary | ICD-10-CM | POA: Diagnosis not present

## 2023-11-19 LAB — CUP PACEART REMOTE DEVICE CHECK
Date Time Interrogation Session: 20250420232126
Implantable Pulse Generator Implant Date: 20240815

## 2023-11-20 ENCOUNTER — Encounter: Payer: Self-pay | Admitting: Cardiology

## 2023-11-26 ENCOUNTER — Other Ambulatory Visit: Payer: Self-pay | Admitting: Pharmacist

## 2023-11-26 DIAGNOSIS — I10 Essential (primary) hypertension: Secondary | ICD-10-CM

## 2023-11-26 DIAGNOSIS — E119 Type 2 diabetes mellitus without complications: Secondary | ICD-10-CM

## 2023-11-26 NOTE — Patient Instructions (Signed)
 Goals Addressed             This Visit's Progress    Pharmacy Goals       The goal A1c is less than 7%. This is the best way to reduce the risk of the long term complications of diabetes, including heart disease, kidney disease, eye disease, strokes, and nerve damage. An A1c of less than 7% corresponds with fasting sugars less than 130 and 2 hour after meal sugars less than 180.   Check your blood pressure once weekly, and any time you have concerning symptoms like headache, chest pain, dizziness, shortness of breath, or vision changes.   Our goal is less than 130/80.  To appropriately check your blood pressure, make sure you do the following:  1) Avoid caffeine, exercise, or tobacco products for 30 minutes before checking. Empty your bladder. 2) Sit with your back supported in a flat-backed chair. Rest your arm on something flat (arm of the chair, table, etc). 3) Sit still with your feet flat on the floor, resting, for at least 5 minutes.  4) Check your blood pressure. Take 1-2 readings.  5) Write down these readings and bring with you to any provider appointments.  Bring your home blood pressure machine with you to a provider's office for accuracy comparison at least once a year.   Make sure you take your blood pressure medications before you come to any office visit, even if you were asked to fast for labs.   Arthur Lash, PharmD, [facility] [phone]

## 2023-11-26 NOTE — Progress Notes (Signed)
 11/26/2023 Name: Elizabeth Scott MRN: [id number] DOB: 1960/10/19  Chief Complaint  Patient presents with   Medication Management    Deklynn Luecke is a 63 y.o. year old female who presented for a telephone visit.   They were referred to the pharmacist by their PCP for assistance in managing medication access.      Subjective:   Care Team: Primary Care Provider: Quinton Buckler, [name] ; Next Scheduled Visit: 02/26/2024 Cardiologist: Boyce Byes, MD Neurologist: Roby Chris, MD  Medication Access/Adherence  Current Pharmacy:  [phone] - [place], [place] - [address] AT [facility] [address] [place] [address] Phone: [phone] Fax: [phone]  [id number] - [place], [place] - [address] 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 5  days/week    Objective:  Lab Results  Component Value Date   HGBA1C 6.2 (H) 10/26/2023    Lab Results  Component Value Date   CREATININE 0.60 10/26/2023   BUN 19 10/26/2023   NA 140 10/26/2023   K 4.4 10/26/2023   CL 105 10/26/2023   CO2 31 10/26/2023    Lab Results  Component Value Date   CHOL 108 10/26/2023   HDL 40 (L) 10/26/2023   LDLCALC 56 10/26/2023   TRIG 50 10/26/2023   CHOLHDL 2.7 10/26/2023   BP Readings from Last 3 Encounters:  11/09/23 (!) 142/78  10/26/23 128/70  10/25/23 (!) 196/84   Pulse Readings from Last 3 Encounters:  11/09/23 99  10/26/23 87  10/25/23 84     Medications Reviewed Today     Reviewed by Ardis Becton, RPH-CPP (Pharmacist) on 11/26/23 at 1345  Med List Status: <None>   Medication Order Taking? Sig Documenting Provider Last Dose Status Informant  albuterol  (VENTOLIN  HFA) 108 (90 Base) MCG/ACT inhaler [id number]  Inhale 1-2 puffs into the lungs every 6 (six) hours as needed. Nellie Banas M, [name]  Active   amLODipine  (NORVASC ) 5 MG tablet [id number] Yes Take 1 tablet (5 mg total) by mouth daily. Quinton Buckler, [name] Taking Active   aspirin  EC 81 MG tablet [id number]  Take 81 mg by mouth daily. [provider]  Active Self  atorvastatin  (LIPITOR) 80 MG tablet [id number]  Take 1 tablet (80 mg total) by mouth daily. Quinton Buckler, [name]  Active   clopidogrel  (PLAVIX ) 75 MG tablet [id number]  Take 1 tablet (75 mg total) by  mouth every evening. Quinton Buckler, [name]  Active   ezetimibe  (ZETIA ) 10 MG tablet [id number]  Take 1 tablet (10 mg total) by mouth daily. Pender, Julie F, [name]  Active   fluticasone  (FLONASE ) 50 MCG/ACT nasal spray [id number]  Place 2 sprays into both nostrils daily. Nellie Banas M, [name]  Active   losartan  (COZAAR ) 50 MG tablet [id number] Yes Take 1 tablet (50 mg total) by mouth daily. Quinton Buckler, [name] Taking Active   methocarbamol  (ROBAXIN ) 500 MG tablet [id number]  TAKE 1 TABLET (500 MG TOTAL)  BY MOUTH 2 (TWO) TIMES DAILY AS NEEDED FOR MUSCLE SPASMS. Quinton Buckler, [name]  Active   oxymetazoline  (AFRIN NASAL SPRAY) 0.05 % nasal spray [id number]  Place 1 spray into both nostrils 2 (two) times daily. Soldatova, Li[place], MD  Active   pantoprazole  (PROTONIX ) 40 MG tablet [id number]  Take 1 tablet (40 mg total) by mouth daily. Quinton Buckler, [name]  Active   pioglitazone  (ACTOS ) 15 MG tablet [id number] Yes Take 1 tablet (15 mg total) by mouth daily. Quinton Buckler, [name] Taking Active   Semaglutide ,0.25 or 0.5MG /DOS, (OZEMPIC , 0.25 OR 0.5 MG/DOSE,) 2 MG/1.5ML SOPN [id number] Yes Inject 0.5 mg into the skin once a week. [provider] Taking Active               Assessment/Plan:   Diabetes: - Reviewed long term cardiovascular and renal outcomes of uncontrolled blood sugar - Reviewed goal A1c, goal fasting, and goal 2 hour post prandial glucose - Reviewed dietary modifications including importance of having regular well-balanced meals and snacks throughout the day, while controlling carbohydrate portion sizes - Recommend to check glucose, keep log of results and have this record to review at upcoming medical appointments. Patient to contact provider office sooner if needed for readings outside of established parameters or symptoms, particularly for any signs of hypoglycemia   Hypertension: - Encourage patient to reduce salt/sodium intake - Recommend to monitor home blood pressure if able in future, keep log of results and have this record to review at upcoming medical appointments. Patient to contact provider office sooner if needed for readings outside of established parameters or symptoms     Follow Up Plan:   Patient denies further medication questions or concerns today Provide patient with contact information for clinic pharmacist to contact if needed in future for medication questions/concerns    Arthur Lash, PharmD, [facility] [phone]

## 2023-12-06 NOTE — Addendum Note (Signed)
 Addended by: Edra Govern D on: 12/06/2023 11:27 AM   Modules accepted: Orders

## 2023-12-06 NOTE — Progress Notes (Signed)
 Carelink Summary Report / Loop Recorder

## 2023-12-13 ENCOUNTER — Other Ambulatory Visit (HOSPITAL_COMMUNITY)
Admission: RE | Admit: 2023-12-13 | Discharge: 2023-12-13 | Disposition: A | Discharge status: Home / Self Care | Source: Ambulatory Visit | Attending: Otolaryngology | Admitting: Otolaryngology

## 2023-12-13 ENCOUNTER — Encounter (INDEPENDENT_AMBULATORY_CARE_PROVIDER_SITE_OTHER): Payer: Self-pay | Admitting: Otolaryngology

## 2023-12-13 ENCOUNTER — Ambulatory Visit (INDEPENDENT_AMBULATORY_CARE_PROVIDER_SITE_OTHER): Admitting: Otolaryngology

## 2023-12-13 VITALS — BP 180/96 | HR 96

## 2023-12-13 DIAGNOSIS — D3702 Neoplasm of uncertain behavior of tongue: Secondary | ICD-10-CM | POA: Insufficient documentation

## 2023-12-13 DIAGNOSIS — K1379 Other lesions of oral mucosa: Secondary | ICD-10-CM

## 2023-12-13 MED ORDER — FLUCONAZOLE 150 MG PO TABS: 150.0000 mg | ORAL_TABLET | Freq: Once | ORAL | 0 refills | Status: AC

## 2023-12-13 MED ORDER — AMOXICILLIN-POT CLAVULANATE 500-125 MG PO TABS: 1.0000 | ORAL_TABLET | Freq: Two times a day (BID) | ORAL | 0 refills | Status: DC

## 2023-12-13 MED ORDER — CHLORHEXIDINE GLUCONATE 0.12 % MT SOLN: 15.0000 mL | Freq: Two times a day (BID) | OROMUCOSAL | 0 refills | Status: AC

## 2023-12-13 NOTE — Progress Notes (Signed)
 Procedure: Excision of oral tongue lesion without closure (25366)  ATTENDING: Artice Last, M.D.   PREOPERATIVE DIAGNOSIS(ES): dorsal tongue raised lesion   POSTOPERATIVE DIAGNOSIS(ES): Same  PROCEDURE PERFORMED: biopsy of the dorsal tongue lesion  anterior 2/3  INDICATIONS FOR PROCEDURE: oral tongue lesion  The risks and benefits of the surgical procedure have been explained in detail to the patient and they have elected to proceed.  CONSENT:  Informed consent was obtained prior to the procedure after discussion of risks, benefits, and alternatives and expected outcomes were discussed with the patient; consent placed in chart. The possibilities of reaction to medication, bleeding, infection, inadequate biopsy, the need for additional procedures, failure to diagnose a condition, and creating a complication requiring transfusion or operation were discussed with the patient. The patient concurred with the proposed plan, giving informed consent.    UNIVERSAL PROTOCOL/ TIMEOUT: Preprocedure verification is complete- patient verified and consents confirmed.  H&P REVIEW: The patient's history and physical were reviewed today prior to procedure. All medications were reviewed and updated as well.  ANESTHESIA: local anesthesia with 1% lidocaine , 1:100,000 epinephrine   PROCEDURE DETAILS: The patient was brought to the clinic and placed in a seated position.  The mucosa over the lesion was anesthetized.  A punch biopsy was used to isolate and excise the lesion and the lesion was then completely removed using pickup and #15 blade. It was sent for permanent pathology. Hemostasis was obtained using Afrin soaked pledgets and Silver  Nitrate. There were no complications and the patient tolerated the procedure well.  ESTIMATED BLOOD LOSS: Minimal  SPECIMEN(S) REMOVED: dorsal tongue lesion anterior 2/3  DISPOSITION OF SPECIMEN(S): Permanent pathology  FINDINGS:  No evidence of significant bleeding or  other complication  CONDITION: Stable  COMPLICATIONS:The patient tolerated the procedure well without apparent complications and was ambulatory.  NOTES: given rx for abx and Peridex mouthwash and instructed to Apply Afrin and gauze if has oozing at home while eating (on anticoagulation), soft foods for a few days

## 2023-12-19 ENCOUNTER — Other Ambulatory Visit: Payer: Self-pay | Admitting: Nurse Practitioner

## 2023-12-19 DIAGNOSIS — E1149 Type 2 diabetes mellitus with other diabetic neurological complication: Secondary | ICD-10-CM

## 2023-12-19 LAB — SURGICAL PATHOLOGY

## 2023-12-20 ENCOUNTER — Ambulatory Visit (INDEPENDENT_AMBULATORY_CARE_PROVIDER_SITE_OTHER): Payer: Self-pay

## 2023-12-20 ENCOUNTER — Other Ambulatory Visit: Payer: Self-pay

## 2023-12-20 MED ORDER — OZEMPIC (0.25 OR 0.5 MG/DOSE) 2 MG/1.5ML ~~LOC~~ SOPN: 0.5000 mg | PEN_INJECTOR | SUBCUTANEOUS | 1 refills | Status: DC

## 2023-12-20 NOTE — Telephone Encounter (Signed)
 Patient states she has been on this medication dose of 0.5

## 2023-12-20 NOTE — Telephone Encounter (Signed)
 Unable to refill per protocol, Rx expired. Discontinued 09/27/23.Aaron Aas Requested Prescriptions  Pending Prescriptions Disp Refills   OZEMPIC , 0.25 OR 0.5 MG/DOSE, 2 MG/3ML SOPN [Pharmacy Med Name: OZEMPIC  0.25-0.5 MG/DOSE PEN]      Sig: INJECT 0.5 MG INTO THE SKIN ONE TIME PER WEEK     Endocrinology:  Diabetes - GLP-1 Receptor Agonists - semaglutide  Failed - 12/20/2023  2:00 PM      Failed - HBA1C in normal range and within 180 days    Hgb A1c MFr Bld  Date Value Ref Range Status  10/26/2023 6.2 (H) <5.7 % of total Hgb Final    Comment:    For someone without known diabetes, a hemoglobin  A1c value between 5.7% and 6.4% is consistent with prediabetes and should be confirmed with a  follow-up test. . For someone with known diabetes, a value <7% indicates that their diabetes is well controlled. A1c targets should be individualized based on duration of diabetes, age, comorbid conditions, and other considerations. . This assay result is consistent with an increased risk of diabetes. . Currently, no consensus exists regarding use of hemoglobin A1c for diagnosis of diabetes for children. .          Passed - Cr in normal range and within 360 days    Creat  Date Value Ref Range Status  10/26/2023 0.60 0.50 - 1.05 mg/dL Final   Creatinine, Urine  Date Value Ref Range Status  10/26/2023 79 20 - 275 mg/dL Final         Passed - Valid encounter within last 6 months    Recent Outpatient Visits           1 month ago Annual physical exam   [facility] Quinton Buckler, [name]   1 month ago Essential hypertension, benign   [facility] Quinton Buckler, [place]

## 2023-12-20 NOTE — Telephone Encounter (Signed)
 Spoke with patient regarding results of biopsy. Patient understood.

## 2023-12-20 NOTE — Telephone Encounter (Signed)
-----   Message from Garza-Salinas II sent at 12/19/2023  3:30 PM EDT ----- Tongue biopsy result positive for a benign vessel growth called hemangioma - this is a benign lesion - please let the patient know thnx ----- Message ----- From: Interface, Lab In [facility] Sent: 12/19/2023   9:58 AM EDT To: Artice Last, MD

## 2023-12-25 ENCOUNTER — Ambulatory Visit (INDEPENDENT_AMBULATORY_CARE_PROVIDER_SITE_OTHER): Payer: No Typology Code available for payment source

## 2023-12-25 DIAGNOSIS — I639 Cerebral infarction, unspecified: Secondary | ICD-10-CM

## 2023-12-25 LAB — CUP PACEART REMOTE DEVICE CHECK
Date Time Interrogation Session: 20250526233034
Implantable Pulse Generator Implant Date: 20240815

## 2023-12-26 ENCOUNTER — Ambulatory Visit: Payer: Self-pay | Admitting: Cardiology

## 2024-01-02 ENCOUNTER — Ambulatory Visit (INDEPENDENT_AMBULATORY_CARE_PROVIDER_SITE_OTHER): Admitting: Otolaryngology

## 2024-01-02 ENCOUNTER — Encounter (INDEPENDENT_AMBULATORY_CARE_PROVIDER_SITE_OTHER): Payer: Self-pay | Admitting: Otolaryngology

## 2024-01-02 VITALS — BP 167/82 | HR 87

## 2024-01-02 DIAGNOSIS — D1809 Hemangioma of other sites: Secondary | ICD-10-CM | POA: Diagnosis not present

## 2024-01-02 NOTE — Progress Notes (Signed)
 ENT Progress Note:   Update 01/02/2024  Discussed the use of AI scribe software for clinical note transcription with the patient, who gave verbal consent to proceed.  History of Present Illness  63 yoF with hx of tongue lesion s/p biopsy 2 weeks ago, here for f/u. Pathology was positive for hemangioma. No issues with eating, no significant pain.   Records Reviewed:  Initial Evaluation  Reason for Consult: tongue lesion x 6 months    HPI: Discussed the use of AI scribe software for clinical note transcription with the patient, who gave verbal consent to proceed.  History of Present Illness Elizabeth Scott is a 63 year old female with a history of stroke last year, on Plavix , who presents with a non-healing tongue lesion. She is accompanied by her husband. She was referred by an emergency room physician due to persistent bleeding from the tongue lesion.  She noticed a lesion on her tongue/mid dorsal surface approximately six months ago. Initially, it appeared as a small bump and has not caused any pain. However, the lesion has bled on several occasions, particularly during tooth brushing. Last Wednesday, the bleeding was significant and did not stop until she applied pressure, prompting a visit to the emergency room. The lesion has not changed in size since she first noticed it.  She is currently on Plavix  due to a previous stroke, which may contribute to the bleeding. The lesion is located in the middle of her tongue, not on the side where she might accidentally bite it. No pain is associated with the lesion. Denies any other trauma to her tongue.   She denies any history of smoking, heavy alcohol use, or personal history of cancer. She uses mouthwash regularly, which may contain alcohol, and sometimes experiences a burning sensation when using it. No family history of cancer is reported.   Records Reviewed:  10/17/2023 ED [name] is a 63 y.o. female.   Pt is a 63  yo female with pmhx significant for htn, dm2, high cholesterol, GERD, and hx CVA (on Plavix ).  Pt has a spot on her tongue which has been intermittently bleeding for a few years.  Today, it was not stopping, so she came in.  She has not had it evaluated in the past.     Comments: Small, raised area to the body of the tongue.  No active bleeding.     Past Medical History:  Diagnosis Date   Allergy    Chronic sinusitis    COVID-19    Diabetes mellitus without complication (HCC)    GERD (gastroesophageal reflux disease)    High cholesterol    History of abnormal cervical Pap smear    Prior to Hysterectomy, normal paps for 2-3 years after Hysterectomy   History of gestational diabetes    Hypertension    Hypopotassemia    Microcytosis    Stroke [facility])     Past Surgical History:  Procedure Laterality Date   ABDOMINAL HYSTERECTOMY  2003   still has ovaries-for menorrhagia   CESAREAN SECTION     COLONOSCOPY  02/22/14   repeat 5 years   COLONOSCOPY WITH PROPOFOL  N/A 05/22/2023   Procedure: COLONOSCOPY WITH PROPOFOL ;  Surgeon: Marnee Sink, MD;  Location: [facility] ENDOSCOPY;  Service: Endoscopy;  Laterality: N/A;   ESOPHAGOGASTRODUODENOSCOPY  02/22/14   TUBAL LIGATION      Family History  Problem Relation Age of Onset   Diabetes Mother    Heart disease Mother    Hypertension Mother  Stroke Mother    Diabetes Father    Hypertension Father    Asthma Sister    Diabetes Sister    Hypertension Sister    Lung disease Sister    Hypertension Brother    Heart disease Brother    Stroke Brother    Asthma Sister    Diabetes Sister    Hypertension Sister    Congestive Heart Failure Maternal Grandmother    Cancer Paternal Grandfather        stomach cancer   COPD Neg Hx     Social History:  reports that she has never smoked. She has never used smokeless tobacco. She reports that she does not drink alcohol and does not use drugs.  Allergies:  Allergies  Allergen Reactions   Metformin  And Related Other (See Comments)    Rash, Thrush    Other     walnuts    Medications: I have reviewed the patient's current medications.  The PMH, PSH, Medications, Allergies, and SH were reviewed and updated.  ROS: Constitutional: Negative for fever, weight loss and weight gain. Cardiovascular: Negative for chest pain and dyspnea on exertion. Respiratory: Is not experiencing shortness of breath at rest. Gastrointestinal: Negative for nausea and vomiting. Neurological: Negative for headaches. Psychiatric: The patient is not nervous/anxious  Blood pressure (!) 167/82, pulse 87, SpO2 95%. There is no height or weight on file to calculate BMI.  PHYSICAL EXAM:  Exam: General: Well-developed, well-nourished Respiratory Respiratory effort: Equal inspiration and expiration without stridor Cardiovascular Peripheral Vascular: Warm extremities with equal color/perfusion Eyes: No nystagmus with equal extraocular motion bilaterally Neuro/Psych/Balance: Patient oriented to person, place, and time; Appropriate mood and affect; Gait is intact with no imbalance; Cranial nerves I-XII are intact Head and Face Inspection: Normocephalic and atraumatic without mass or lesion Palpation: Facial skeleton intact without bony stepoffs Salivary Glands: No mass or tenderness Facial Strength: Facial motility symmetric and full bilaterally ENT Pinna: External ear intact and fully developed External canal: Canal is patent with intact skin Tympanic Membrane: Clear and mobile External Nose: No scar or anatomic deformity Internal Nose: Septum is relatively straight on anterior rhinoscopy.  Lips, Teeth, and gums: Mucosa and teeth intact and viable TMJ: No pain to palpation with full mobility Oral cavity/oropharynx: No erythema or exudate, no lesions present Tongue biopsy site healed completely small scab no bleeding  Neck and Trachea: Midline trachea without mass or lesion Thyroid : No mass or  nodularity Lymphatics: No lymphadenopathy  Pathology from 12/13/23 (+) Hemangioma    Assessment/Plan: Encounter Diagnosis  Name Primary?   Hemangioma of tongue Yes     Assessment and Plan Assessment & Plan Tongue lesion Bleeding from a small tongue lesion at the center of the dorsal surface, lesion could represent a hemangioma vs neoplasm, will require biopsy for tissue diagnosis. We discussed that most likely this is a non-cancerous lesion, but tissue diagnosis is required to determine malignancy risk. Persistent bleeding could be due to Plavix . Excision recommended for definitive diagnosis. - Schedule excision with local anesthesia. - Fax clearance form to her medical doctor regarding Plavix  hold. - Prescribe Afrin nasal spray for bleeding control. Instruct on application method (cotton ball soaked in Afrin to be applied on the lesion with steady pressure) - Advised soft diet post-procedure.  Update 01/02/2024  Assessment & Plan  Hemangioma of the tongue  Biopsy confirmed benign hemangioma. Lesion healed well  - No further treatment required. - Advised to return for future otolaryngological issues.   Thank you for  allowing me to participate in the care of this patient. Please do not hesitate to contact me with any questions or concerns.   Artice Last, MD Otolaryngology [facility] Phone: [phone] Fax: [phone]    01/02/2024, 3:00 PM

## 2024-01-09 NOTE — Progress Notes (Signed)
 Carelink Summary Report / Loop Recorder

## 2024-01-24 ENCOUNTER — Ambulatory Visit (INDEPENDENT_AMBULATORY_CARE_PROVIDER_SITE_OTHER)

## 2024-01-24 DIAGNOSIS — I639 Cerebral infarction, unspecified: Secondary | ICD-10-CM

## 2024-01-25 ENCOUNTER — Ambulatory Visit: Payer: Self-pay | Admitting: Cardiology

## 2024-01-25 LAB — CUP PACEART REMOTE DEVICE CHECK
Date Time Interrogation Session: 20250626234902
Implantable Pulse Generator Implant Date: 20240815

## 2024-02-06 ENCOUNTER — Ambulatory Visit: Payer: Self-pay

## 2024-02-06 NOTE — Telephone Encounter (Signed)
 FYI Only or Action Required?: Action required by provider: requesting a phone call for PCP recommendation in regards to anxiety.  Patient was last seen in primary care on 11/09/2023 by Gareth Mliss FALCON, [name].  Called Nurse Triage reporting Anxiety.  Symptoms began 2 months ago.  Interventions attempted: Rest, hydration, or home remedies.  Symptoms are: unchanged.  Triage Disposition: See PCP When Office is Open (Within 3 Days)-asking for a phone call back  Patient/caregiver understands and will follow disposition?: No, wishes to speak with PCP  Copied from CRM [id number]. Topic: Clinical - Red Word Triage >> Feb 06, 2024  8:32 AM Vena H wrote: Red Word that prompted transfer to Nurse Triage: Pt called in wanting to get scheduled for anxiety attacks, states she has been having them and had one this morning when she got to work. Reason for Disposition  MODERATE anxiety (e.g., persistent or frequent anxiety symptoms; interferes with sleep, school, or work)  Answer Assessment - Initial Assessment Questions 1. CONCERN: Did anything happen that prompted you to call today?      Patient reports having anxiety attacks that have been going on for the last two months. Patient reports an anxiety attack while going to work today.  2. ANXIETY SYMPTOMS: Can you describe how you (your loved one; patient) have been feeling? (e.g., tense, restless, panicky, anxious, keyed up, overwhelmed, sense of impending doom).      Anxious, jittery 3. ONSET: How long have you been feeling this way? (e.g., hours, days, weeks)     2 weeks ago 4. SEVERITY: How would you rate the level of anxiety? (e.g., 0 - 10; or mild, moderate, severe).     Mild-moderate 5. FUNCTIONAL IMPAIRMENT: How have these feelings affected your ability to do daily activities? Have you had more difficulty than usual doing your normal daily activities? (e.g., getting better, same, worse; self-care, school, work, interactions)     Patient  reports not effecting her ability to complete daily activities or work but patient does feel anxious. 6. HISTORY: Have you felt this way before? Have you ever been diagnosed with an anxiety problem in the past? (e.g., generalized anxiety disorder, panic attacks, PTSD). If Yes, ask: How was this problem treated? (e.g., medicines, counseling, etc.)     no 7. RISK OF HARM - SUICIDAL IDEATION: Do you ever have thoughts of hurting or killing yourself? If Yes, ask:  Do you have these feelings now? Do you have a plan on how you would do this?     no 8. TREATMENT:  What has been done so far to treat this anxiety? (e.g., medicines, relaxation strategies). What has helped?     Patient reports trying to use breathing exercises to get her through them 9. TREATMENT - THERAPIST: Do you have a counselor or therapist? Name?     N/a 10. POTENTIAL TRIGGERS: Do you drink caffeinated beverages (e.g., coffee, colas, teas), and how much daily? Do you drink alcohol or use any drugs? Have you started any new medicines recently?       Colas-3 a day. No alcohol or drug use. 11. PATIENT SUPPORT: Who is with you now? Who do you live with? Do you have family or friends who you can talk to?        Patient reports she lives with her husband. Able to talk to her sister about her feelings.  12. OTHER SYMPTOMS: Do you have any other symptoms? (e.g., feeling depressed, trouble concentrating, trouble sleeping, trouble breathing, palpitations or fast  heartbeat, chest pain, sweating, nausea, or diarrhea)       Patient reports sweating at times 13. PREGNANCY: Is there any chance you are pregnant? When was your last menstrual period?       No  Patient is requesting for medication to be sent to the pharmacy to help with her anxiety. Patient is requesting if PCP wants her seen in the office, she would need to be seen today due to availability. Patient is requesting a phone call from staff.  Protocols  used: Anxiety and Panic Attack-A-AH

## 2024-02-06 NOTE — Telephone Encounter (Signed)
 Appt sch'd with Julie for 02/07/24

## 2024-02-06 NOTE — Telephone Encounter (Signed)
 Needs appt

## 2024-02-07 ENCOUNTER — Ambulatory Visit: Admitting: Nurse Practitioner

## 2024-02-08 ENCOUNTER — Ambulatory Visit (INDEPENDENT_AMBULATORY_CARE_PROVIDER_SITE_OTHER): Admitting: Nurse Practitioner

## 2024-02-08 ENCOUNTER — Encounter: Payer: Self-pay | Admitting: Nurse Practitioner

## 2024-02-08 VITALS — BP 134/82 | HR 94 | Temp 97.9°F | Ht 63.0 in | Wt 224.8 lb

## 2024-02-08 DIAGNOSIS — F419 Anxiety disorder, unspecified: Secondary | ICD-10-CM | POA: Insufficient documentation

## 2024-02-08 MED ORDER — BUSPIRONE HCL 5 MG PO TABS: 5.0000 mg | ORAL_TABLET | Freq: Three times a day (TID) | ORAL | 0 refills | Status: DC | PRN

## 2024-02-08 NOTE — Progress Notes (Signed)
 BP 134/82 (BP Location: Right Arm, Patient Position: Sitting, Cuff Size: Normal)   Pulse 94   Temp 97.9 F (36.6 C) (Oral)   Ht 5' 3 (1.6 m)   Wt 224 lb 12.8 oz (102 kg)   SpO2 99%   BMI 39.82 kg/m    Subjective:    Patient ID: Elizabeth Scott, female    DOB: 03-24-1961, 63 y.o.   MRN: [id number]  HPI: Glenetta Kiger is a 63 y.o. female  Chief Complaint  Patient presents with   Anxiety    Onset 2-3 months. Has episodes a few times a month of panic attacks/ anxiety. Patient uses breathing exercises to get through     Discussed the use of AI scribe software for clinical note transcription with the patient, who gave verbal consent to proceed.  History of Present Illness Raizy Auzenne is a 63 year old female who presents with anxiety and panic attacks.  She has been experiencing anxiety and panic attacks for the past two to three months, particularly triggered in situations such as driving in heavy traffic, which previously did not cause her distress. A recent incident involved significant anxiety while driving to work, necessitating a pause to calm down before entering her workplace. She has no prior history of similar symptoms.  She manages her anxiety with breathing exercises and prefers to use medication on an as-needed basis rather than daily. The frequency of anxiety episodes varies, with some weeks passing without any incidents.  She reports that she does not feel clinically depressed. She experiences normal mood fluctuations without prolonged sadness.  Her sleep is disrupted due to her husband's sleep pattern, as he wakes up when she is trying to sleep, leading to poor sleep quality for her.         02/08/2024    7:50 AM 11/09/2023    2:41 PM 10/26/2023    8:59 AM  Depression screen PHQ 2/9  Decreased Interest 1 0 0  Down, Depressed, Hopeless 1 0 0  PHQ - 2 Score 2 0 0  Altered sleeping 2 1 0  Tired, decreased energy 1 0 0  Change in  appetite 0 0 0  Feeling bad or failure about yourself  0 0 0  Trouble concentrating 1 0 0  Moving slowly or fidgety/restless 0 0 0  Suicidal thoughts 0 0 0  PHQ-9 Score 6 1 0  Difficult doing work/chores Somewhat difficult Not difficult at all Not difficult at all       02/08/2024    7:53 AM 10/26/2023    9:00 AM 09/30/2021    8:29 AM 02/11/2020    8:35 AM  GAD 7 : Generalized Anxiety Score  Nervous, Anxious, on Edge 1 0 0 0  Control/stop worrying 1 0 0 0  Worry too much - different things 1 0 0 0  Trouble relaxing 1 0 0 0  Restless 0 0 0 0  Easily annoyed or irritable 1 0 0 0  Afraid - awful might happen 1 0 0 0  Total GAD 7 Score 6 0 0 0  Anxiety Difficulty Somewhat difficult Not difficult at all Not difficult at all      Relevant past medical, surgical, family and social history reviewed and updated as indicated. Interim medical history since our last visit reviewed. Allergies and medications reviewed and updated.  Review of Systems  Constitutional: Negative for fever or weight change.  Respiratory: Negative for cough and shortness of breath.  Cardiovascular: Negative for chest pain or palpitations.  Gastrointestinal: Negative for abdominal pain, no bowel changes.  Musculoskeletal: Negative for gait problem or joint swelling.  Skin: Negative for rash.  Neurological: Negative for dizziness or headache.  No other specific complaints in a complete review of systems (except as listed in HPI above).      Objective:     BP 134/82 (BP Location: Right Arm, Patient Position: Sitting, Cuff Size: Normal)   Pulse 94   Temp 97.9 F (36.6 C) (Oral)   Ht 5' 3 (1.6 m)   Wt 224 lb 12.8 oz (102 kg)   SpO2 99%   BMI 39.82 kg/m    Wt Readings from Last 3 Encounters:  02/08/24 224 lb 12.8 oz (102 kg)  11/09/23 226 lb 11.2 oz (102.8 kg)  10/26/23 226 lb 4.8 oz (102.6 kg)    Physical Exam Physical Exam GENERAL: Alert, cooperative, well developed, no acute distress. HEENT:  Normocephalic, normal oropharynx, moist mucous membranes. CHEST: Clear to auscultation bilaterally, no wheezes, rhonchi, or crackles. CARDIOVASCULAR: Normal heart rate and rhythm, S1 and S2 normal without murmurs. ABDOMEN: Soft, non-tender, non-distended, without organomegaly, normal bowel sounds. EXTREMITIES: No cyanosis or edema. NEUROLOGICAL: Cranial nerves grossly intact, moves all extremities without gross motor or sensory deficit.   Results for orders placed or performed in visit on 01/24/24  CUP PACEART REMOTE DEVICE CHECK   Collection Time: 01/24/24 11:49 PM  Result Value Ref Range   Date Time Interrogation Session [id number]    Pulse Generator Manufacturer MERM    Pulse Gen Model LNQ22 LINQ II    Pulse Gen Serial Number [id number]    Clinic Name [facility]    Implantable Pulse Generator Type ICM/ILR    Implantable Pulse Generator Implant Date [id number]           Assessment & Plan:   Problem List Items Addressed This Visit   None Visit Diagnoses       Anxiety    -  Primary   Relevant Medications   busPIRone  (BUSPAR ) 5 MG tablet        Assessment and Plan Assessment & Plan Anxiety disorder Anxiety symptoms began 2-3 months ago, with episodes of panic attacks. Symptoms are situational, occurring during driving or in high-traffic areas. Anxiety symptoms began after a recent stroke. Positive anxiety screening. Prefers as-needed medication for management. - Prescribe Buspar  to be taken up to three times a day as needed. - Schedule follow-up appointment in four weeks to assess response to medication. - Instruct to contact the office if symptoms worsen.  Depression Positive depression screening, but denies feeling depressed. Experiences occasional sadness but does not remain in a depressive state.        Follow up plan: Return in about 4 weeks (around 03/07/2024) for follow up.

## 2024-02-18 NOTE — Progress Notes (Signed)
 Carelink Summary Report / Loop Recorder

## 2024-02-25 ENCOUNTER — Ambulatory Visit (INDEPENDENT_AMBULATORY_CARE_PROVIDER_SITE_OTHER)

## 2024-02-25 DIAGNOSIS — I639 Cerebral infarction, unspecified: Secondary | ICD-10-CM

## 2024-02-25 LAB — CUP PACEART REMOTE DEVICE CHECK
Date Time Interrogation Session: 20250727234816
Implantable Pulse Generator Implant Date: 20240815

## 2024-02-26 ENCOUNTER — Ambulatory Visit: Admitting: Nurse Practitioner

## 2024-03-03 ENCOUNTER — Ambulatory Visit: Payer: Self-pay | Admitting: Cardiology

## 2024-03-09 ENCOUNTER — Other Ambulatory Visit: Payer: Self-pay | Admitting: Nurse Practitioner

## 2024-03-09 DIAGNOSIS — F419 Anxiety disorder, unspecified: Secondary | ICD-10-CM

## 2024-03-10 ENCOUNTER — Encounter: Payer: Self-pay | Admitting: Nurse Practitioner

## 2024-03-10 ENCOUNTER — Ambulatory Visit: Admitting: Nurse Practitioner

## 2024-03-10 VITALS — BP 132/72 | HR 78 | Temp 98.0°F | Resp 18 | Ht 63.0 in | Wt 222.6 lb

## 2024-03-10 DIAGNOSIS — I10 Essential (primary) hypertension: Secondary | ICD-10-CM | POA: Diagnosis not present

## 2024-03-10 DIAGNOSIS — E66812 Obesity, class 2: Secondary | ICD-10-CM

## 2024-03-10 DIAGNOSIS — E1149 Type 2 diabetes mellitus with other diabetic neurological complication: Secondary | ICD-10-CM | POA: Diagnosis not present

## 2024-03-10 DIAGNOSIS — K219 Gastro-esophageal reflux disease without esophagitis: Secondary | ICD-10-CM

## 2024-03-10 DIAGNOSIS — Z8673 Personal history of transient ischemic attack (TIA), and cerebral infarction without residual deficits: Secondary | ICD-10-CM

## 2024-03-10 DIAGNOSIS — F419 Anxiety disorder, unspecified: Secondary | ICD-10-CM | POA: Diagnosis not present

## 2024-03-10 DIAGNOSIS — E782 Mixed hyperlipidemia: Secondary | ICD-10-CM

## 2024-03-10 DIAGNOSIS — M25561 Pain in right knee: Secondary | ICD-10-CM

## 2024-03-10 DIAGNOSIS — Z1211 Encounter for screening for malignant neoplasm of colon: Secondary | ICD-10-CM

## 2024-03-10 DIAGNOSIS — Z6837 Body mass index (BMI) 37.0-37.9, adult: Secondary | ICD-10-CM

## 2024-03-10 DIAGNOSIS — Z7984 Long term (current) use of oral hypoglycemic drugs: Secondary | ICD-10-CM

## 2024-03-10 DIAGNOSIS — E785 Hyperlipidemia, unspecified: Secondary | ICD-10-CM

## 2024-03-10 LAB — POCT GLYCOSYLATED HEMOGLOBIN (HGB A1C): Hemoglobin A1C: 7.3 % — AB (ref 4.0–5.6)

## 2024-03-10 MED ORDER — PANTOPRAZOLE SODIUM 40 MG PO TBEC: 40.0000 mg | DELAYED_RELEASE_TABLET | Freq: Every day | ORAL | 1 refills | Status: DC

## 2024-03-10 MED ORDER — OZEMPIC (0.25 OR 0.5 MG/DOSE) 2 MG/1.5ML ~~LOC~~ SOPN: 0.5000 mg | PEN_INJECTOR | SUBCUTANEOUS | 1 refills | Status: AC

## 2024-03-10 MED ORDER — PIOGLITAZONE HCL 15 MG PO TABS: 15.0000 mg | ORAL_TABLET | Freq: Every day | ORAL | 1 refills | Status: AC

## 2024-03-10 MED ORDER — AMLODIPINE BESYLATE 5 MG PO TABS
5.0000 mg | ORAL_TABLET | Freq: Every day | ORAL | 1 refills | Status: AC
Start: 2024-03-10 — End: ?

## 2024-03-10 MED ORDER — CLOPIDOGREL BISULFATE 75 MG PO TABS: 75.0000 mg | ORAL_TABLET | Freq: Every evening | ORAL | 1 refills | Status: AC

## 2024-03-10 MED ORDER — CELECOXIB 100 MG PO CAPS: 100.0000 mg | ORAL_CAPSULE | Freq: Two times a day (BID) | ORAL | 0 refills | Status: DC

## 2024-03-10 MED ORDER — LOSARTAN POTASSIUM 50 MG PO TABS: 50.0000 mg | ORAL_TABLET | Freq: Every day | ORAL | 1 refills | Status: AC

## 2024-03-10 MED ORDER — EZETIMIBE 10 MG PO TABS: 10.0000 mg | ORAL_TABLET | Freq: Every day | ORAL | 1 refills | Status: AC

## 2024-03-10 MED ORDER — ATORVASTATIN CALCIUM 80 MG PO TABS: 80.0000 mg | ORAL_TABLET | Freq: Every day | ORAL | 0 refills | Status: DC

## 2024-03-10 NOTE — Progress Notes (Signed)
 BP 132/72   Pulse 78   Temp 98 F (36.7 C)   Resp 18   Ht 5' 3 (1.6 m)   Wt 222 lb 9.6 oz (101 kg)   SpO2 96%   BMI 39.43 kg/m    Subjective:    Patient ID: Elizabeth Scott, female    DOB: 1961/01/29, 63 y.o.   MRN: [id number]  HPI: Elizabeth Scott is a 63 y.o. female  Chief Complaint  Patient presents with   Medical Management of Chronic Issues   Diabetes   Knee Pain    Onset 2 days right side of knee    Discussed the use of AI scribe software for clinical note transcription with the patient, who gave verbal consent to proceed.  History of Present Illness Elizabeth Scott is a 63 year old female who presents for a routine follow-up focused on anxiety management.  Anxiety and panic symptoms - Anxiety and panic attacks ongoing for the past 2-3 months - Episodes are particularly triggered by driving in heavy traffic - Utilizes breathing exercises for symptom management - Prescribed Buspar  5 mg three times daily as needed; has taken one pill since last visit - Increased stress due to being short-staffed at work - Currently on vacation ('staycation')  Right knee pain - Severe right knee pain, especially with ambulation - Pain localized mostly to the lateral aspect of the knee - Causes limping - No recent falls or specific traumatic incident - Suspects possible twisting injury - Tylenol  used for pain relief with minimal effect  Type 2 diabetes mellitus - Managed with Actos  15 mg daily and Ozempic  0.5 mg weekly - Last hemoglobin A1c was 7.3% - Blood glucose typically in the 120s to 130s - Overdue for diabetic eye examination  Hypertension - Managed with amlodipine  5 mg daily and losartan  50 mg daily  Gastroesophageal reflux disease (gerd) - Well-controlled with pantoprazole  40 mg daily - No recent symptoms  Hyperlipidemia - Managed with atorvastatin  80 mg daily and Zetia  10 mg daily - LDL previously within normal range  Cerebrovascular  disease - History of stroke - On aspirin  81 mg daily and Plavix  75 mg daily for secondary prevention  Obesity - Weight at last appointment was 224 pounds - BMI 39.82     Body mass index is 39.43 kg/m.  Filed Weights   03/10/24 1440  Weight: 222 lb 9.6 oz (101 kg)    Waist Measurement : 45 inches      03/10/2024    2:47 PM 02/08/2024    7:50 AM 11/09/2023    2:41 PM  Depression screen PHQ 2/9  Decreased Interest 0 1 0  Down, Depressed, Hopeless 0 1 0  PHQ - 2 Score 0 2 0  Altered sleeping 0 2 1  Tired, decreased energy 0 1 0  Change in appetite 0 0 0  Feeling bad or failure about yourself  0 0 0  Trouble concentrating 0 1 0  Moving slowly or fidgety/restless 0 0 0  Suicidal thoughts 0 0 0  PHQ-9 Score 0 6 1  Difficult doing work/chores Not difficult at all Somewhat difficult Not difficult at all       03/10/2024    2:47 PM 02/08/2024    7:53 AM 10/26/2023    9:00 AM 09/30/2021    8:29 AM  GAD 7 : Generalized Anxiety Score  Nervous, Anxious, on Edge 0 1 0 0  Control/stop worrying 0 1 0 0  Worry too much -  different things 0 1 0 0  Trouble relaxing 0 1 0 0  Restless 0 0 0 0  Easily annoyed or irritable 0 1 0 0  Afraid - awful might happen 0 1 0 0  Total GAD 7 Score 0 6 0 0  Anxiety Difficulty Not difficult at all Somewhat difficult Not difficult at all Not difficult at all     Relevant past medical, surgical, family and social history reviewed and updated as indicated. Interim medical history since our last visit reviewed. Allergies and medications reviewed and updated.  Review of Systems  Ten systems reviewed and is negative except as mentioned in HPI      Objective:     BP 132/72   Pulse 78   Temp 98 F (36.7 C)   Resp 18   Ht 5' 3 (1.6 m)   Wt 222 lb 9.6 oz (101 kg)   SpO2 96%   BMI 39.43 kg/m    Wt Readings from Last 3 Encounters:  03/10/24 222 lb 9.6 oz (101 kg)  02/08/24 224 lb 12.8 oz (102 kg)  11/09/23 226 lb 11.2 oz (102.8 kg)     Physical Exam Physical Exam VITALS: BP- 132/132 GENERAL: Alert, cooperative, well developed, no acute distress. HEENT: Normocephalic, normal oropharynx, moist mucous membranes. CHEST: Clear to auscultation bilaterally, no wheezes, rhonchi, or crackles. CARDIOVASCULAR: Normal heart rate and rhythm, S1 and S2 normal without murmurs. ABDOMEN: Soft, non-tender, non-distended, without organomegaly, normal bowel sounds. EXTREMITIES: No cyanosis or edema. MUSCULOSKELETAL: Right knee pain on lateral side, non-tender on palpation. NEUROLOGICAL: Cranial nerves grossly intact, moves all extremities without gross motor or sensory deficit.   Results for orders placed or performed in visit on 03/10/24  POCT HgB A1C   Collection Time: 03/10/24  2:56 PM  Result Value Ref Range   Hemoglobin A1C 7.3 (A) 4.0 - 5.6 %   HbA1c POC (<> result, manual entry)     HbA1c, POC (prediabetic range)     HbA1c, POC (controlled diabetic range)            Assessment & Plan:   Problem List Items Addressed This Visit       Cardiovascular and Mediastinum   Essential hypertension, benign (Chronic)   Relevant Medications   atorvastatin  (LIPITOR) 80 MG tablet   amLODipine  (NORVASC ) 5 MG tablet   ezetimibe  (ZETIA ) 10 MG tablet   losartan  (COZAAR ) 50 MG tablet     Digestive   Gastroesophageal reflux disease without esophagitis   Relevant Medications   pantoprazole  (PROTONIX ) 40 MG tablet     Endocrine   Diabetes mellitus (HCC) - Primary   Relevant Medications   atorvastatin  (LIPITOR) 80 MG tablet   losartan  (COZAAR ) 50 MG tablet   pioglitazone  (ACTOS ) 15 MG tablet   Semaglutide ,0.25 or 0.5MG /DOS, (OZEMPIC , 0.25 OR 0.5 MG/DOSE,) 2 MG/1.5ML SOPN   Other Relevant Orders   POCT HgB A1C (Completed)     Other   Hyperlipidemia   Relevant Medications   atorvastatin  (LIPITOR) 80 MG tablet   amLODipine  (NORVASC ) 5 MG tablet   ezetimibe  (ZETIA ) 10 MG tablet   losartan  (COZAAR ) 50 MG tablet   Class 2 severe  obesity with serious comorbidity and body mass index (BMI) of 37.0 to 37.9 in adult [facility])   Relevant Medications   pioglitazone  (ACTOS ) 15 MG tablet   Semaglutide ,0.25 or 0.5MG /DOS, (OZEMPIC , 0.25 OR 0.5 MG/DOSE,) 2 MG/1.5ML SOPN   History of stroke   Relevant Medications   clopidogrel  (PLAVIX ) 75 MG  tablet   Anxiety   Other Visit Diagnoses       Screening for colon cancer       Relevant Orders   MM 3D SCREENING MAMMOGRAM BILATERAL BREAST     Acute pain of right knee       Relevant Medications   celecoxib  (CELEBREX ) 100 MG capsule        Assessment and Plan Assessment & Plan Anxiety disorder Anxiety and panic attacks have been present for the past two to three months, particularly triggered in situations such as driving in heavy traffic. She has been using breathing exercises and Buspar  as needed, with improvement noted. She reports having to take only one pill since the last visit, indicating better control of symptoms. - Continue Buspar  5 mg three times a day as needed for anxiety - Encourage use of breathing exercises for anxiety management  Right knee pain Right knee pain with no clear history of trauma or injury. Pain is localized mostly on the side of the knee, with some swelling. Pain is exacerbated by walking, causing a limp. Tylenol  has been used for pain relief but is not very effective. Celebrex  is considered for pain management, with a plan to monitor response and consider orthopedic referral if no improvement. - Prescribe Celebrex  for pain management - Advise to monitor knee pain and report back by Friday - Consider referral to orthopedics if no improvement with Celebrex   Type 2 diabetes mellitus A1c has increased to 7.3 from a previous 6.2, indicating suboptimal glycemic control. Blood sugar levels have been running in the 120s to 130s, which is acceptable but requires monitoring. - Perform point-of-care A1c test - Ensure diabetic eye exam is scheduled -continue  taking ozempic  0.5 mg weekly, actos  15 mg daily  Hypertension Blood pressure is 132, which is within acceptable range for her condition. - Continue current antihypertensive regimen  Hyperlipidemia LDL levels are within normal range. - Continue current lipid-lowering therapy  Hx of cva History of cerebrovascular accident with no new neurological symptoms reported. - Continue current medications including aspirin  and Plavix         Follow up plan: Return in about 6 months (around 09/10/2024) for follow up.

## 2024-03-12 NOTE — Telephone Encounter (Signed)
 Requested Prescriptions  Pending Prescriptions Disp Refills   busPIRone  (BUSPAR ) 5 MG tablet [Pharmacy Med Name: BUSPIRONE  HCL 5 MG TABLET] 270 tablet 0    Sig: TAKE 1 TABLET BY MOUTH 3 TIMES A DAY AS NEEDED FOR ANXIETY     Psychiatry: Anxiolytics/Hypnotics - Non-controlled Passed - 03/12/2024  2:46 PM      Passed - Valid encounter within last 12 months    Recent Outpatient Visits           2 days ago Type 2 diabetes mellitus with other neurologic complication, without long-term current use of insulin  [facility])   [facility] Gareth Mliss FALCON, [name]   1 month ago Anxiety   [facility] Gareth Mliss FALCON, [name]   4 months ago Annual physical exam   [facility] Gareth Mliss FALCON, [name]   4 months ago Essential hypertension, benign   [facility] Gareth Mliss FALCON, [name]       Future Appointments             In 4 months Gareth, Mliss FALCON, [name] [facility], [facility]

## 2024-03-24 ENCOUNTER — Other Ambulatory Visit (HOSPITAL_COMMUNITY): Payer: Self-pay

## 2024-03-27 ENCOUNTER — Ambulatory Visit (INDEPENDENT_AMBULATORY_CARE_PROVIDER_SITE_OTHER)

## 2024-03-27 DIAGNOSIS — I639 Cerebral infarction, unspecified: Secondary | ICD-10-CM

## 2024-03-27 LAB — CUP PACEART REMOTE DEVICE CHECK
Date Time Interrogation Session: 20250827233810
Implantable Pulse Generator Implant Date: 20240815

## 2024-03-28 ENCOUNTER — Ambulatory Visit: Payer: Self-pay | Admitting: Cardiology

## 2024-04-10 NOTE — Progress Notes (Signed)
 Remote Loop Recorder Transmission

## 2024-04-14 ENCOUNTER — Other Ambulatory Visit: Payer: Self-pay | Admitting: Nurse Practitioner

## 2024-04-14 DIAGNOSIS — M25561 Pain in right knee: Secondary | ICD-10-CM

## 2024-04-15 NOTE — Telephone Encounter (Signed)
 Requested Prescriptions  Pending Prescriptions Disp Refills   celecoxib  (CELEBREX ) 100 MG capsule [Pharmacy Med Name: CELECOXIB  100 MG CAPSULE] 60 capsule 2    Sig: TAKE 1 CAPSULE BY MOUTH TWICE A DAY     Analgesics:  COX2 Inhibitors Failed - 04/15/2024  4:31 PM      Failed - Manual Review: Labs are only required if the patient has taken medication for more than 8 weeks.      Failed - eGFR is 30 or above and within 360 days    GFR, Est African American  Date Value Ref Range Status  04/07/2020 116 > OR = 60 mL/min/1.7m2 Final   GFR, Est Non African American  Date Value Ref Range Status  04/07/2020 100 > OR = 60 mL/min/1.64m2 Final   GFR, Estimated  Date Value Ref Range Status  12/10/2022 >60 >60 mL/min Final    Comment:    (NOTE) Calculated using the CKD-EPI Creatinine Equation (2021)    eGFR  Date Value Ref Range Status  03/16/2023 99 > OR = 60 mL/min/1.81m2 Final         Passed - HGB in normal range and within 360 days    Hemoglobin  Date Value Ref Range Status  10/26/2023 12.2 11.7 - 15.5 g/dL Final  98/69/7982 87.3 11.1 - 15.9 g/dL Final         Passed - Cr in normal range and within 360 days    Creat  Date Value Ref Range Status  10/26/2023 0.60 0.50 - 1.05 mg/dL Final   Creatinine, Urine  Date Value Ref Range Status  10/26/2023 79 20 - 275 mg/dL Final         Passed - HCT in normal range and within 360 days    HCT  Date Value Ref Range Status  10/26/2023 38.0 35.0 - 45.0 % Final   Hematocrit  Date Value Ref Range Status  08/30/2015 36.1 34.0 - 46.6 % Final         Passed - AST in normal range and within 360 days    AST  Date Value Ref Range Status  10/26/2023 13 10 - 35 U/L Final         Passed - ALT in normal range and within 360 days    ALT  Date Value Ref Range Status  10/26/2023 17 6 - 29 U/L Final         Passed - Patient is not pregnant      Passed - Valid encounter within last 12 months    Recent Outpatient Visits           1  month ago Type 2 diabetes mellitus with other neurologic complication, without long-term current use of insulin  [facility])   [facility] Gareth Mliss FALCON, [name]   2 months ago Anxiety   [facility] Gareth Mliss FALCON, [name]   5 months ago Annual physical exam   [facility] Gareth Mliss FALCON, [name]   5 months ago Essential hypertension, benign   [facility] Gareth Mliss FALCON, [name]       Future Appointments             In 2 months Gareth, Mliss FALCON, [name] [facility] [place]

## 2024-04-17 ENCOUNTER — Other Ambulatory Visit (HOSPITAL_COMMUNITY): Payer: Self-pay

## 2024-04-18 NOTE — Progress Notes (Signed)
 Remote Loop Recorder Transmission

## 2024-04-18 NOTE — Addendum Note (Signed)
 Addended by: VICCI SELLER A on: 04/18/2024 12:47 PM   Modules accepted: Orders

## 2024-04-28 ENCOUNTER — Ambulatory Visit (INDEPENDENT_AMBULATORY_CARE_PROVIDER_SITE_OTHER)

## 2024-04-28 DIAGNOSIS — I639 Cerebral infarction, unspecified: Secondary | ICD-10-CM

## 2024-04-28 LAB — CUP PACEART REMOTE DEVICE CHECK
Date Time Interrogation Session: 20250928234515
Implantable Pulse Generator Implant Date: 20240815

## 2024-04-30 NOTE — Progress Notes (Signed)
 Remote Loop Recorder Transmission

## 2024-05-01 NOTE — Progress Notes (Signed)
 Remote Loop Recorder Transmission

## 2024-05-02 ENCOUNTER — Ambulatory Visit: Payer: Self-pay | Admitting: Cardiology

## 2024-05-11 ENCOUNTER — Other Ambulatory Visit: Payer: Self-pay | Admitting: Nurse Practitioner

## 2024-05-11 DIAGNOSIS — I10 Essential (primary) hypertension: Secondary | ICD-10-CM

## 2024-05-11 DIAGNOSIS — E1149 Type 2 diabetes mellitus with other diabetic neurological complication: Secondary | ICD-10-CM

## 2024-05-13 NOTE — Telephone Encounter (Signed)
 Requested Prescriptions  Refused Prescriptions Disp Refills   losartan  (COZAAR ) 50 MG tablet [Pharmacy Med Name: LOSARTAN  POTASSIUM 50 MG TAB] 90 tablet 1    Sig: TAKE 1 TABLET BY MOUTH EVERY DAY     Cardiovascular:  Angiotensin Receptor Blockers Failed - 05/13/2024  2:12 PM      Failed - Cr in normal range and within 180 days    Creat  Date Value Ref Range Status  10/26/2023 0.60 0.50 - 1.05 mg/dL Final   Creatinine, Urine  Date Value Ref Range Status  10/26/2023 79 20 - 275 mg/dL Final         Failed - K in normal range and within 180 days    Potassium  Date Value Ref Range Status  10/26/2023 4.4 3.5 - 5.3 mmol/L Final         Passed - Patient is not pregnant      Passed - Last BP in normal range    BP Readings from Last 1 Encounters:  03/10/24 132/72         Passed - Valid encounter within last 6 months    Recent Outpatient Visits           2 months ago Type 2 diabetes mellitus with other neurologic complication, without long-term current use of insulin  [facility])   [facility] Gareth Mliss FALCON, [name]   3 months ago Anxiety   [facility] Gareth Mliss FALCON, [name]   6 months ago Annual physical exam   [facility] Gareth Mliss FALCON, [name]   6 months ago Essential hypertension, benign   [facility] Gareth Mliss FALCON, [name]       Future Appointments             In 1 month Gareth, Mliss FALCON, [name] [facility] [place]

## 2024-05-29 ENCOUNTER — Ambulatory Visit: Payer: Self-pay | Admitting: Cardiology

## 2024-05-29 ENCOUNTER — Ambulatory Visit

## 2024-05-29 DIAGNOSIS — I639 Cerebral infarction, unspecified: Secondary | ICD-10-CM

## 2024-05-29 LAB — CUP PACEART REMOTE DEVICE CHECK
Date Time Interrogation Session: 20251029233817
Implantable Pulse Generator Implant Date: 20240815

## 2024-06-03 NOTE — Progress Notes (Signed)
 Remote Loop Recorder Transmission

## 2024-06-29 ENCOUNTER — Encounter

## 2024-07-02 ENCOUNTER — Ambulatory Visit: Attending: Student in an Organized Health Care Education/Training Program

## 2024-07-03 LAB — CUP PACEART REMOTE DEVICE CHECK
Date Time Interrogation Session: 20251202233204
Implantable Pulse Generator Implant Date: 20240815

## 2024-07-09 NOTE — Progress Notes (Signed)
 Remote Loop Recorder Transmission

## 2024-07-10 ENCOUNTER — Ambulatory Visit: Admitting: Nurse Practitioner

## 2024-07-17 ENCOUNTER — Encounter: Payer: Self-pay | Admitting: Nurse Practitioner

## 2024-07-17 ENCOUNTER — Ambulatory Visit (INDEPENDENT_AMBULATORY_CARE_PROVIDER_SITE_OTHER): Admitting: Nurse Practitioner

## 2024-07-17 VITALS — BP 138/80 | HR 83 | Temp 97.7°F | Ht 63.0 in | Wt 230.0 lb

## 2024-07-17 DIAGNOSIS — K219 Gastro-esophageal reflux disease without esophagitis: Secondary | ICD-10-CM | POA: Diagnosis not present

## 2024-07-17 DIAGNOSIS — M25512 Pain in left shoulder: Secondary | ICD-10-CM

## 2024-07-17 DIAGNOSIS — M5441 Lumbago with sciatica, right side: Secondary | ICD-10-CM

## 2024-07-17 DIAGNOSIS — Z1231 Encounter for screening mammogram for malignant neoplasm of breast: Secondary | ICD-10-CM

## 2024-07-17 DIAGNOSIS — M25511 Pain in right shoulder: Secondary | ICD-10-CM | POA: Diagnosis not present

## 2024-07-17 MED ORDER — PANTOPRAZOLE SODIUM 40 MG PO TBEC: 40.0000 mg | DELAYED_RELEASE_TABLET | Freq: Every day | ORAL | 1 refills | Status: AC

## 2024-07-17 MED ORDER — METHOCARBAMOL 500 MG PO TABS: 500.0000 mg | ORAL_TABLET | Freq: Two times a day (BID) | ORAL | 0 refills | Status: AC | PRN

## 2024-07-17 NOTE — Progress Notes (Signed)
 BP 138/80   Pulse 83   Temp 97.7 F (36.5 C)   Ht 5' 3 (1.6 m)   Wt 230 lb (104.3 kg)   SpO2 99%   BMI 40.74 kg/m    Subjective:    Patient ID: Elizabeth Scott, female    DOB: [birth year], 63 y.o.   MRN: [id number]  HPI: Elizabeth Scott is a 63 y.o. female  Chief Complaint  Patient presents with   Shoulder Pain    Pt c/o chronic bilateral shoulder pain.    Discussed the use of AI scribe software for clinical note transcription with the patient, who gave verbal consent to proceed.  History of Present Illness Elizabeth Scott is a 63 year old female who presents with bilateral shoulder pain.  Bilateral shoulder pain - Persistent pain for approximately one month - Pain is bilateral and not associated with recent trauma or falls - pain increases with elevating of arms - Pain persists even when lying in bed and is not relieved by changing positions - Tylenol  and previously Celebrex  used for pain management - Robaxin  provided relief but she has run out; typically took it at night  Glycemic control - Managing blood sugar levels at home without significant issues  Medication management - Currently taking Atorvastatin  and Pantoprazole  - Needs refill on Pantoprazole   Musculoskeletal symptoms - No issues with hips - No other new or worsening symptoms         03/10/2024    2:47 PM 02/08/2024    7:50 AM 11/09/2023    2:41 PM  Depression screen PHQ 2/9  Decreased Interest 0 1 0  Down, Depressed, Hopeless 0 1 0  PHQ - 2 Score 0 2 0  Altered sleeping 0 2 1  Tired, decreased energy 0 1 0  Change in appetite 0 0 0  Feeling bad or failure about yourself  0 0 0  Trouble concentrating 0 1 0  Moving slowly or fidgety/restless 0 0 0  Suicidal thoughts 0 0 0  PHQ-9 Score 0  6  1   Difficult doing work/chores Not difficult at all Somewhat difficult Not difficult at all     Data saved with a previous flowsheet row definition    Relevant past medical,  surgical, family and social history reviewed and updated as indicated. Interim medical history since our last visit reviewed. Allergies and medications reviewed and updated.  Review of Systems  Ten systems reviewed and is negative except as mentioned in HPI      Objective:      BP 138/80   Pulse 83   Temp 97.7 F (36.5 C)   Ht 5' 3 (1.6 m)   Wt 230 lb (104.3 kg)   SpO2 99%   BMI 40.74 kg/m    Wt Readings from Last 3 Encounters:  07/17/24 230 lb (104.3 kg)  03/10/24 222 lb 9.6 oz (101 kg)  02/08/24 224 lb 12.8 oz (102 kg)    Physical Exam GENERAL: Alert, cooperative, well developed, no acute distress. HEENT: Normocephalic, normal oropharynx, moist mucous membranes. NECK: Tenderness on the right side and left side of the neck bilaterally. CHEST: Clear to auscultation bilaterally, no wheezes, rhonchi, or crackles. CARDIOVASCULAR: Normal heart rate and rhythm, S1 and S2 normal without murmurs. ABDOMEN: Soft, non-tender, non-distended, without organomegaly, normal bowel sounds. EXTREMITIES: No cyanosis or edema. Pain reproduced in anterior part of shoulder when rising arms NEUROLOGICAL: Cranial nerves grossly intact, moves all extremities without gross motor or sensory deficit.  Results for orders placed or performed in visit on 07/02/24  CUP PACEART REMOTE DEVICE CHECK   Collection Time: 07/01/24 11:32 PM  Result Value Ref Range   Date Time Interrogation Session [id number]    Pulse Generator Manufacturer MERM    Pulse Gen Model [id number] GARR HEATH    Pulse Gen Serial Number [id number]    Cl[facility]    Implantable Pulse Generator Type ICM/ILR    Implantable Pulse Generator Implant Date [id number]           Assessment & Plan:   Problem List Items Addressed This Visit       Digestive   Gastroesophageal reflux disease without esophagitis   Relevant Medications   pantoprazole  (PROTONIX ) 40 MG tablet   Other Visit Diagnoses       Screening mammogram  for breast cancer    -  Primary   Relevant Orders   MM 3D SCREENING MAMMOGRAM BILATERAL BREAST     Acute right-sided low back pain with right-sided sciatica       Relevant Medications   methocarbamol  (ROBAXIN ) 500 MG tablet   Other Relevant Orders   CBC with Differential/Platelet   Comprehensive metabolic panel with GFR   C-reactive protein   Sedimentation rate   Hemoglobin A1c   TSH   CK   Analyzer ANA IFA w/RFLX Titer/Pattern,Systemic Autoimmune Panel 1        Assessment and Plan Assessment & Plan Bilateral shoulder pain, rule out inflammatory or degenerative arthritis Bilateral shoulder pain for approximately one month, affecting both shoulders. Differential diagnosis includes adhesive capsulitis, polymyalgia rheumatica, rheumatoid arthritis, and regular arthritis. Pain is not exacerbated by regular movement and is relieved by Tylenol  and muscle relaxers. Did increase pain when raising arms.  No recent trauma or falls reported. - Ordered lab work including sed rate, CRP, A1c, and CK, cbc, cmp, autoimmune panel - Refilled muscle relaxer (Robaxin ) - Will consider steroid taper based on lab results  Gastroesophageal reflux disease GERD managed with pantoprazole . She reports running low on medication. - Refilled pantoprazole  prescription  General health maintenance Discussion of upcoming mammogram and eye exam. She is awaiting contact for mammogram scheduling and plans to arrange an eye exam. - Provided phone number for mammogram scheduling - Encouraged scheduling of eye exam        Follow up plan: Return if symptoms worsen or fail to improve.

## 2024-07-18 ENCOUNTER — Other Ambulatory Visit: Payer: Self-pay | Admitting: Nurse Practitioner

## 2024-07-18 DIAGNOSIS — M5441 Lumbago with sciatica, right side: Secondary | ICD-10-CM

## 2024-07-19 ENCOUNTER — Other Ambulatory Visit: Payer: Self-pay | Admitting: Nurse Practitioner

## 2024-07-19 DIAGNOSIS — M25561 Pain in right knee: Secondary | ICD-10-CM

## 2024-07-21 ENCOUNTER — Ambulatory Visit: Payer: Self-pay | Admitting: Nurse Practitioner

## 2024-07-23 ENCOUNTER — Ambulatory Visit: Payer: Self-pay | Admitting: Student in an Organized Health Care Education/Training Program

## 2024-07-23 LAB — COMPREHENSIVE METABOLIC PANEL WITH GFR
AG Ratio: 1.8 (calc) (ref 1.0–2.5)
ALT: 16 U/L (ref 6–29)
AST: 14 U/L (ref 10–35)
Albumin: 4.2 g/dL (ref 3.6–5.1)
Alkaline phosphatase (APISO): 87 U/L (ref 37–153)
BUN: 16 mg/dL (ref 7–25)
CO2: 31 mmol/L (ref 20–32)
Calcium: 9.3 mg/dL (ref 8.6–10.4)
Chloride: 104 mmol/L (ref 98–110)
Creat: 0.66 mg/dL (ref 0.50–1.05)
Globulin: 2.3 g/dL (ref 1.9–3.7)
Glucose, Bld: 135 mg/dL — ABNORMAL HIGH (ref 65–99)
Potassium: 3.8 mmol/L (ref 3.5–5.3)
Sodium: 141 mmol/L (ref 135–146)
Total Bilirubin: 1 mg/dL (ref 0.2–1.2)
Total Protein: 6.5 g/dL (ref 6.1–8.1)
eGFR: 99 mL/min/1.73m2

## 2024-07-23 LAB — ANALYZER(R)ANA IFA WITH REFLEX TITER/PATTRN,SYS AUTOIMM PNL1
Anti Nuclear Antibody (ANA): NEGATIVE
Anticardiolipin IgA: <2 [APL'U]/mL
Anticardiolipin IgG: <2 [GPL'U]/mL
Anticardiolipin IgM: <2 [MPL'U]/mL
Beta-2 Glyco 1 IgA: <2 U/mL
Beta-2 Glyco 1 IgM: <2 U/mL
Beta-2 Glyco I IgG: <2 U/mL
C3 Complement: 167 mg/dL (ref 83–193)
C4 Complement: 44 mg/dL (ref 15–57)
Centromere Ab Screen: <1 AI
Chromatin (Nucleosomal) Antibody: <1 AI
Cyclic Citrullin Peptide Ab: <16 U
DNA Ab (DS) Crithidia, IFA: NEGATIVE
ENA SM Ab Ser-aCnc: <1 AI
Jo-1 Autoabs: <1 AI
MUTATED CITRULLINATED VIMENTIN (MCV) AB: <20 U/mL
Rheumatoid Factor (IgA): <5 U
Rheumatoid Factor (IgG): <5 U
Rheumatoid Factor (IgM): <5 U
Ribonucleic Protein(ENA) Antibody, IgG: <1 AI
SM/RNP: <1 AI
SSA (Ro) (ENA) Antibody, IgG: <1 AI
SSB (La) (ENA) Antibody, IgG: <1 AI
Scleroderma (Scl-70) (ENA) Antibody, IgG: <1 AI
Thyroperoxidase Ab SerPl-aCnc: 2 [IU]/mL

## 2024-07-23 LAB — CBC WITH DIFFERENTIAL/PLATELET
Absolute Lymphocytes: 1476 {cells}/uL (ref 850–3900)
Absolute Monocytes: 481 {cells}/uL (ref 200–950)
Basophils Absolute: 59 {cells}/uL (ref 0–200)
Basophils Relative: 0.9 %
Eosinophils Absolute: 150 {cells}/uL (ref 15–500)
Eosinophils Relative: 2.3 %
HCT: 37.4 % (ref 35.9–46.0)
Hemoglobin: 12 g/dL (ref 11.7–15.5)
MCH: 24.4 pg — ABNORMAL LOW (ref 27.0–33.0)
MCHC: 32.1 g/dL (ref 31.6–35.4)
MCV: 76 fL — ABNORMAL LOW (ref 81.4–101.7)
MPV: 11.8 fL (ref 7.5–12.5)
Monocytes Relative: 7.4 %
Neutro Abs: 4336 {cells}/uL (ref 1500–7800)
Neutrophils Relative %: 66.7 %
Platelets: 217 Thousand/uL (ref 140–400)
RBC: 4.92 Million/uL (ref 3.80–5.10)
RDW: 16.3 % — ABNORMAL HIGH (ref 11.0–15.0)
Total Lymphocyte: 22.7 %
WBC: 6.5 Thousand/uL (ref 3.8–10.8)

## 2024-07-23 LAB — SEDIMENTATION RATE: Sed Rate: 11 mm/h (ref 0–30)

## 2024-07-23 LAB — IRON,TIBC AND FERRITIN PANEL
%SAT: 29 % (ref 16–45)
Ferritin: 71 ng/mL (ref 16–288)
Iron: 90 ug/dL (ref 45–160)
TIBC: 306 ug/dL (ref 250–450)

## 2024-07-23 LAB — TEST AUTHORIZATION

## 2024-07-23 LAB — HEMOGLOBIN A1C
Hgb A1c MFr Bld: 7 % — ABNORMAL HIGH
Mean Plasma Glucose: 154 mg/dL
eAG (mmol/L): 8.5 mmol/L

## 2024-07-23 LAB — C-REACTIVE PROTEIN: CRP: <3 mg/L

## 2024-07-23 LAB — TSH: TSH: 1.16 m[IU]/L (ref 0.40–4.50)

## 2024-07-23 NOTE — Telephone Encounter (Signed)
 Requested Prescriptions  Pending Prescriptions Disp Refills   celecoxib  (CELEBREX ) 100 MG capsule [Pharmacy Med Name: CELECOXIB  100 MG CAPSULE] 180 capsule 0    Sig: TAKE 1 CAPSULE BY MOUTH TWICE A DAY     Analgesics:  COX2 Inhibitors Failed - 07/23/2024  9:21 AM      Failed - Manual Review: Labs are only required if the patient has taken medication for more than 8 weeks.      Passed - HGB in normal range and within 360 days    Hemoglobin  Date Value Ref Range Status  07/17/2024 12.0 11.7 - 15.5 g/dL Final  98/69/7982 87.3 11.1 - 15.9 g/dL Final         Passed - Cr in normal range and within 360 days    Creat  Date Value Ref Range Status  07/17/2024 0.66 0.50 - 1.05 mg/dL Final   Creatinine, Urine  Date Value Ref Range Status  10/26/2023 79 20 - 275 mg/dL Final         Passed - HCT in normal range and within 360 days    HCT  Date Value Ref Range Status  07/17/2024 37.4 35.9 - 46.0 % Final   Hematocrit  Date Value Ref Range Status  08/30/2015 36.1 34.0 - 46.6 % Final         Passed - AST in normal range and within 360 days    AST  Date Value Ref Range Status  07/17/2024 14 10 - 35 U/L Final         Passed - ALT in normal range and within 360 days    ALT  Date Value Ref Range Status  07/17/2024 16 6 - 29 U/L Final         Passed - eGFR is 30 or above and within 360 days    GFR, Est African American  Date Value Ref Range Status  04/07/2020 116 > OR = 60 mL/min/1.41m2 Final   GFR, Est Non African American  Date Value Ref Range Status  04/07/2020 100 > OR = 60 mL/min/1.23m2 Final   GFR, Estimated  Date Value Ref Range Status  12/10/2022 >60 >60 mL/min Final    Comment:    (NOTE) Calculated using the CKD-EPI Creatinine Equation (2021)    eGFR  Date Value Ref Range Status  07/17/2024 99 > OR = 60 mL/min/1.44m2 Final         Passed - Patient is not pregnant      Passed - Valid encounter within last 12 months    Recent Outpatient Visits           6  days ago Acute pain of both shoulders   [facility] Gareth Clarity F, [name]   4 months ago Type 2 diabetes mellitus with other neurologic complication, without long-term current use of insulin  [facility])   [facility] Gareth Clarity FALCON, [name]   5 months ago Anxiety   [facility] Gareth Clarity FALCON, [name]   8 months ago Annual physical exam   [facility] Gareth Clarity FALCON, [name]   9 months ago Essential hypertension, benign   [facility] Gareth Clarity FALCON, [place]

## 2024-07-30 ENCOUNTER — Encounter

## 2024-08-02 ENCOUNTER — Ambulatory Visit: Attending: Student in an Organized Health Care Education/Training Program

## 2024-08-02 DIAGNOSIS — I639 Cerebral infarction, unspecified: Secondary | ICD-10-CM

## 2024-08-04 LAB — CUP PACEART REMOTE DEVICE CHECK
Date Time Interrogation Session: 20260102232601
Implantable Pulse Generator Implant Date: 20240815

## 2024-08-05 ENCOUNTER — Ambulatory Visit: Payer: Self-pay | Admitting: Student in an Organized Health Care Education/Training Program

## 2024-08-07 NOTE — Progress Notes (Signed)
 Remote Loop Recorder Transmission

## 2024-08-26 ENCOUNTER — Other Ambulatory Visit: Payer: Self-pay | Admitting: Nurse Practitioner

## 2024-08-26 ENCOUNTER — Telehealth: Payer: Self-pay

## 2024-08-26 DIAGNOSIS — E1149 Type 2 diabetes mellitus with other diabetic neurological complication: Secondary | ICD-10-CM

## 2024-08-26 DIAGNOSIS — E785 Hyperlipidemia, unspecified: Secondary | ICD-10-CM

## 2024-08-26 MED ORDER — ATORVASTATIN CALCIUM 80 MG PO TABS: 80.0000 mg | ORAL_TABLET | Freq: Every day | ORAL | 0 refills | Status: AC

## 2024-08-26 NOTE — Telephone Encounter (Signed)
 Copied from CRM #[id number]. Topic: Clinical - Medication Refill >> Aug 26, 2024  4:40 PM Everette C wrote: Medication: atorvastatin  (LIPITOR) 80 MG tablet [[id number]]  Has the patient contacted their pharmacy? Yes (Agent: If no, request that the patient contact the pharmacy for the refill. If patient does not wish to contact the pharmacy document the reason why and proceed with request.) (Agent: If yes, when and what did the pharmacy advise?)  This is the patient's preferred pharmacy:  [id number] - [place], [place] - [address] AT [facility] [address]  [place] [address] Phone: [phone] Fax: [phone]  Is this the correct pharmacy for this prescription? Yes If no, delete pharmacy and type the correct one.   Has the prescription been filled recently? Yes  Is the patient out of the medication? Yes  Has the patient been seen for an appointment in the last year OR does the patient have an upcoming appointment? Yes  Can we respond through MyChart? No  Agent: Please be advised that Rx refills may take up to 3 business days. We ask that you follow-up with your pharmacy.

## 2024-08-27 NOTE — Telephone Encounter (Signed)
 Duplicate request, refilled 08/26/24.  Requested Prescriptions  Pending Prescriptions Disp Refills   atorvastatin  (LIPITOR) 80 MG tablet [Pharmacy Med Name: ATORVASTATIN  80 MG TABLET] 90 tablet 0    Sig: TAKE 1 TABLET BY MOUTH EVERY DAY     Cardiovascular:  Antilipid - Statins Failed - 08/27/2024  2:37 PM      Failed - Lipid Panel in normal range within the last 12 months    Cholesterol, Total  Date Value Ref Range Status  08/30/2015 151 100 - 199 mg/dL Final   Cholesterol  Date Value Ref Range Status  10/26/2023 108 <200 mg/dL Final   LDL Cholesterol (Calc)  Date Value Ref Range Status  10/26/2023 56 mg/dL (calc) Final    Comment:    Reference range: <100 . Desirable range <100 mg/dL for primary prevention;   <70 mg/dL for patients with CHD or diabetic patients  with > or = 2 CHD risk factors. SABRA LDL-C is now calculated using the Martin-Hopkins  calculation, which is a validated novel method providing  better accuracy than the Friedewald equation in the  estimation of LDL-C.  Gladis APPLETHWAITE et al. SANDREA. 7986;689(80): 2061-2068  (http://education.[facility].com/faq/FAQ164)    HDL  Date Value Ref Range Status  10/26/2023 40 (L) > OR = 50 mg/dL Final  98/69/7982 41 >60 mg/dL Final   Triglycerides  Date Value Ref Range Status  10/26/2023 50 <150 mg/dL Final         Passed - Patient is not pregnant      Passed - Valid encounter within last 12 months    Recent Outpatient Visits           1 month ago Acute pain of both shoulders   [facility] Gareth Clarity F, [name]   5 months ago Type 2 diabetes mellitus with other neurologic complication, without long-term current use of insulin  [facility])   [facility] Gareth Clarity FALCON, [name]   6 months ago Anxiety   [facility] Gareth Clarity FALCON, [name]   9 months ago Annual physical exam   [facility] Gareth Clarity FALCON, [name]   10 months  ago Essential hypertension, benign   [facility] Gareth Clarity [place], [place]

## 2024-08-30 ENCOUNTER — Encounter

## 2024-08-30 ENCOUNTER — Other Ambulatory Visit: Payer: Self-pay | Admitting: Nurse Practitioner

## 2024-08-30 DIAGNOSIS — E1149 Type 2 diabetes mellitus with other diabetic neurological complication: Secondary | ICD-10-CM

## 2024-09-01 NOTE — Telephone Encounter (Signed)
 Requested Prescriptions  Refused Prescriptions Disp Refills   OZEMPIC , 0.25 OR 0.5 MG/DOSE, 2 MG/3ML SOPN [Pharmacy Med Name: OZEMPIC  0.25-0.5 MG/DOSE PEN]  1    Sig: INJECT 0.5 MG INTO THE SKIN ONE TIME PER WEEK     Endocrinology:  Diabetes - GLP-1 Receptor Agonists - semaglutide  Failed - 09/01/2024  5:24 PM      Failed - HBA1C in normal range and within 180 days    Hgb A1c MFr Bld  Date Value Ref Range Status  07/17/2024 7.0 (H) <5.7 % Final    Comment:    For someone without known diabetes, a hemoglobin A1c value of 6.5% or greater indicates that they may have  diabetes and this should be confirmed with a follow-up  test. . For someone with known diabetes, a value <7% indicates  that their diabetes is well controlled and a value  greater than or equal to 7% indicates suboptimal  control. A1c targets should be individualized based on  duration of diabetes, age, comorbid conditions, and  other considerations. . Currently, no consensus exists regarding use of hemoglobin A1c for diagnosis of diabetes for children. .          Passed - Cr in normal range and within 360 days    Creat  Date Value Ref Range Status  07/17/2024 0.66 0.50 - 1.05 mg/dL Final   Creatinine, Urine  Date Value Ref Range Status  10/26/2023 79 20 - 275 mg/dL Final         Passed - Valid encounter within last 6 months    Recent Outpatient Visits           1 month ago Acute pain of both shoulders   [facility] Gareth Clarity F, [name]   5 months ago Type 2 diabetes mellitus with other neurologic complication, without long-term current use of insulin  [facility])   [facility] Gareth Clarity FALCON, [name]   6 months ago Anxiety   [facility] Gareth Clarity FALCON, [name]   9 months ago Annual physical exam   [facility] Gareth Clarity FALCON, [name]   10 months ago Essential hypertension, benign   [facility] Gareth Clarity [place], [place]

## 2024-09-02 ENCOUNTER — Ambulatory Visit

## 2024-09-03 LAB — CUP PACEART REMOTE DEVICE CHECK
Date Time Interrogation Session: 20260202232330
Implantable Pulse Generator Implant Date: 20240815

## 2024-09-30 ENCOUNTER — Encounter

## 2024-10-03 ENCOUNTER — Ambulatory Visit
